# Patient Record
Sex: Female | Born: 1970 | Race: White | Hispanic: No | Smoking: Never smoker
Health system: Southern US, Community
[De-identification: ages and names within clinical notes are randomized; demographics above are authoritative.]

## PROBLEM LIST (undated history)

## (undated) DIAGNOSIS — K449 Diaphragmatic hernia without obstruction or gangrene: Secondary | ICD-10-CM

## (undated) DIAGNOSIS — K2270 Barrett's esophagus without dysplasia: Secondary | ICD-10-CM

## (undated) DIAGNOSIS — F419 Anxiety disorder, unspecified: Secondary | ICD-10-CM

## (undated) DIAGNOSIS — T7840XA Allergy, unspecified, initial encounter: Secondary | ICD-10-CM

## (undated) DIAGNOSIS — M797 Fibromyalgia: Secondary | ICD-10-CM

## (undated) DIAGNOSIS — G35 Multiple sclerosis: Secondary | ICD-10-CM

## (undated) DIAGNOSIS — R000 Tachycardia, unspecified: Secondary | ICD-10-CM

## (undated) DIAGNOSIS — Z9289 Personal history of other medical treatment: Secondary | ICD-10-CM

## (undated) DIAGNOSIS — F319 Bipolar disorder, unspecified: Secondary | ICD-10-CM

## (undated) DIAGNOSIS — M069 Rheumatoid arthritis, unspecified: Secondary | ICD-10-CM

## (undated) DIAGNOSIS — F32A Depression, unspecified: Secondary | ICD-10-CM

## (undated) DIAGNOSIS — IMO0002 Reserved for concepts with insufficient information to code with codable children: Secondary | ICD-10-CM

## (undated) DIAGNOSIS — K589 Irritable bowel syndrome without diarrhea: Secondary | ICD-10-CM

## (undated) DIAGNOSIS — K5120 Ulcerative (chronic) proctitis without complications: Secondary | ICD-10-CM

## (undated) DIAGNOSIS — N159 Renal tubulo-interstitial disease, unspecified: Secondary | ICD-10-CM

## (undated) DIAGNOSIS — D649 Anemia, unspecified: Secondary | ICD-10-CM

## (undated) DIAGNOSIS — M329 Systemic lupus erythematosus, unspecified: Secondary | ICD-10-CM

## (undated) DIAGNOSIS — D509 Iron deficiency anemia, unspecified: Secondary | ICD-10-CM

## (undated) DIAGNOSIS — K219 Gastro-esophageal reflux disease without esophagitis: Secondary | ICD-10-CM

## (undated) DIAGNOSIS — F329 Major depressive disorder, single episode, unspecified: Secondary | ICD-10-CM

## (undated) HISTORY — DX: Allergy, unspecified, initial encounter: T78.40XA

## (undated) HISTORY — PX: NASAL SINUS SURGERY: SHX719

## (undated) HISTORY — DX: Systemic lupus erythematosus, unspecified: M32.9

## (undated) HISTORY — DX: Barrett's esophagus without dysplasia: K22.70

## (undated) HISTORY — DX: Depression, unspecified: F32.A

## (undated) HISTORY — DX: Tachycardia, unspecified: R00.0

## (undated) HISTORY — DX: Ulcerative (chronic) proctitis without complications: K51.20

## (undated) HISTORY — DX: Diaphragmatic hernia without obstruction or gangrene: K44.9

## (undated) HISTORY — PX: APPENDECTOMY: SHX54

## (undated) HISTORY — DX: Rheumatoid arthritis, unspecified: M06.9

## (undated) HISTORY — DX: Irritable bowel syndrome, unspecified: K58.9

## (undated) HISTORY — PX: MANDIBLE FRACTURE SURGERY: SHX706

## (undated) HISTORY — DX: Multiple sclerosis: G35

## (undated) HISTORY — DX: Iron deficiency anemia, unspecified: D50.9

## (undated) HISTORY — DX: Bipolar disorder, unspecified: F31.9

## (undated) HISTORY — DX: Anemia, unspecified: D64.9

## (undated) HISTORY — DX: Major depressive disorder, single episode, unspecified: F32.9

## (undated) HISTORY — DX: Reserved for concepts with insufficient information to code with codable children: IMO0002

## (undated) HISTORY — DX: Anxiety disorder, unspecified: F41.9

## (undated) HISTORY — DX: Renal tubulo-interstitial disease, unspecified: N15.9

## (undated) HISTORY — PX: PARTIAL HYSTERECTOMY: SHX80

## (undated) HISTORY — DX: Fibromyalgia: M79.7

## (undated) HISTORY — DX: Gastro-esophageal reflux disease without esophagitis: K21.9

## (undated) HISTORY — DX: Personal history of other medical treatment: Z92.89

---

## 1997-11-28 ENCOUNTER — Other Ambulatory Visit: Admission: RE | Admit: 1997-11-28 | Discharge: 1997-11-28 | Payer: Self-pay | Admitting: Obstetrics & Gynecology

## 1998-12-16 ENCOUNTER — Other Ambulatory Visit: Admission: RE | Admit: 1998-12-16 | Discharge: 1998-12-16 | Payer: Self-pay | Admitting: Obstetrics & Gynecology

## 1999-01-07 ENCOUNTER — Encounter: Payer: Self-pay | Admitting: Obstetrics & Gynecology

## 1999-01-07 ENCOUNTER — Ambulatory Visit (HOSPITAL_COMMUNITY): Admission: RE | Admit: 1999-01-07 | Discharge: 1999-01-07 | Payer: Self-pay | Admitting: Obstetrics & Gynecology

## 1999-09-24 ENCOUNTER — Observation Stay (HOSPITAL_COMMUNITY): Admission: RE | Admit: 1999-09-24 | Discharge: 1999-09-25 | Payer: Self-pay | Admitting: Oral and Maxillofacial Surgery

## 2000-01-22 ENCOUNTER — Other Ambulatory Visit: Admission: RE | Admit: 2000-01-22 | Discharge: 2000-01-22 | Payer: Self-pay | Admitting: Obstetrics & Gynecology

## 2000-07-20 ENCOUNTER — Other Ambulatory Visit: Admission: RE | Admit: 2000-07-20 | Discharge: 2000-07-20 | Payer: Self-pay | Admitting: Obstetrics & Gynecology

## 2000-09-24 ENCOUNTER — Ambulatory Visit (HOSPITAL_COMMUNITY): Admission: RE | Admit: 2000-09-24 | Discharge: 2000-09-24 | Payer: Self-pay | Admitting: Oral and Maxillofacial Surgery

## 2000-09-24 ENCOUNTER — Encounter: Payer: Self-pay | Admitting: Oral and Maxillofacial Surgery

## 2001-02-07 ENCOUNTER — Other Ambulatory Visit: Admission: RE | Admit: 2001-02-07 | Discharge: 2001-02-07 | Payer: Self-pay | Admitting: Obstetrics & Gynecology

## 2001-07-28 ENCOUNTER — Other Ambulatory Visit: Admission: RE | Admit: 2001-07-28 | Discharge: 2001-07-28 | Payer: Self-pay | Admitting: Obstetrics & Gynecology

## 2002-01-25 ENCOUNTER — Other Ambulatory Visit: Admission: RE | Admit: 2002-01-25 | Discharge: 2002-01-25 | Payer: Self-pay | Admitting: Obstetrics & Gynecology

## 2002-03-02 ENCOUNTER — Encounter: Payer: Self-pay | Admitting: Family Medicine

## 2002-03-02 ENCOUNTER — Ambulatory Visit (HOSPITAL_COMMUNITY): Admission: RE | Admit: 2002-03-02 | Discharge: 2002-03-02 | Payer: Self-pay | Admitting: Family Medicine

## 2002-09-30 ENCOUNTER — Inpatient Hospital Stay (HOSPITAL_COMMUNITY): Admission: AD | Admit: 2002-09-30 | Discharge: 2002-09-30 | Payer: Self-pay | Admitting: Gynecology

## 2003-01-30 ENCOUNTER — Other Ambulatory Visit: Admission: RE | Admit: 2003-01-30 | Discharge: 2003-01-30 | Payer: Self-pay | Admitting: Obstetrics & Gynecology

## 2003-03-06 ENCOUNTER — Encounter (INDEPENDENT_AMBULATORY_CARE_PROVIDER_SITE_OTHER): Payer: Self-pay

## 2003-03-06 ENCOUNTER — Observation Stay (HOSPITAL_COMMUNITY): Admission: RE | Admit: 2003-03-06 | Discharge: 2003-03-07 | Payer: Self-pay | Admitting: Surgery

## 2003-03-16 ENCOUNTER — Encounter: Admission: RE | Admit: 2003-03-16 | Discharge: 2003-03-16 | Payer: Self-pay | Admitting: Surgery

## 2003-09-24 ENCOUNTER — Ambulatory Visit (HOSPITAL_COMMUNITY): Admission: RE | Admit: 2003-09-24 | Discharge: 2003-09-24 | Payer: Self-pay | Admitting: Obstetrics and Gynecology

## 2003-11-17 ENCOUNTER — Inpatient Hospital Stay (HOSPITAL_COMMUNITY): Admission: AD | Admit: 2003-11-17 | Discharge: 2003-11-17 | Payer: Self-pay | Admitting: Obstetrics & Gynecology

## 2003-11-26 ENCOUNTER — Encounter (INDEPENDENT_AMBULATORY_CARE_PROVIDER_SITE_OTHER): Payer: Self-pay | Admitting: *Deleted

## 2003-11-26 ENCOUNTER — Inpatient Hospital Stay (HOSPITAL_COMMUNITY): Admission: AD | Admit: 2003-11-26 | Discharge: 2003-11-30 | Payer: Self-pay | Admitting: Obstetrics & Gynecology

## 2004-02-29 ENCOUNTER — Other Ambulatory Visit: Admission: RE | Admit: 2004-02-29 | Discharge: 2004-02-29 | Payer: Self-pay | Admitting: Obstetrics & Gynecology

## 2004-07-18 ENCOUNTER — Ambulatory Visit: Payer: Self-pay | Admitting: Gastroenterology

## 2004-08-21 ENCOUNTER — Ambulatory Visit: Payer: Self-pay | Admitting: Gastroenterology

## 2004-10-23 ENCOUNTER — Ambulatory Visit (HOSPITAL_COMMUNITY): Admission: RE | Admit: 2004-10-23 | Discharge: 2004-10-23 | Payer: Self-pay | Admitting: Orthopedic Surgery

## 2004-10-23 ENCOUNTER — Ambulatory Visit (HOSPITAL_BASED_OUTPATIENT_CLINIC_OR_DEPARTMENT_OTHER): Admission: RE | Admit: 2004-10-23 | Discharge: 2004-10-23 | Payer: Self-pay | Admitting: Orthopedic Surgery

## 2004-10-24 ENCOUNTER — Ambulatory Visit: Payer: Self-pay | Admitting: Gastroenterology

## 2005-03-11 ENCOUNTER — Other Ambulatory Visit: Admission: RE | Admit: 2005-03-11 | Discharge: 2005-03-11 | Payer: Self-pay | Admitting: Obstetrics & Gynecology

## 2005-04-17 ENCOUNTER — Ambulatory Visit: Payer: Self-pay | Admitting: Gastroenterology

## 2005-05-15 ENCOUNTER — Ambulatory Visit: Payer: Self-pay | Admitting: Gastroenterology

## 2005-05-25 ENCOUNTER — Encounter (INDEPENDENT_AMBULATORY_CARE_PROVIDER_SITE_OTHER): Payer: Self-pay | Admitting: *Deleted

## 2005-05-25 ENCOUNTER — Ambulatory Visit: Payer: Self-pay | Admitting: Gastroenterology

## 2005-06-12 ENCOUNTER — Ambulatory Visit: Payer: Self-pay | Admitting: Gastroenterology

## 2005-07-13 ENCOUNTER — Ambulatory Visit: Payer: Self-pay | Admitting: Internal Medicine

## 2005-09-11 ENCOUNTER — Ambulatory Visit: Payer: Self-pay | Admitting: Gastroenterology

## 2005-12-07 ENCOUNTER — Emergency Department (HOSPITAL_COMMUNITY): Admission: EM | Admit: 2005-12-07 | Discharge: 2005-12-07 | Payer: Self-pay | Admitting: Emergency Medicine

## 2006-01-28 ENCOUNTER — Ambulatory Visit: Payer: Self-pay | Admitting: Gastroenterology

## 2006-02-23 ENCOUNTER — Ambulatory Visit: Payer: Self-pay | Admitting: Gastroenterology

## 2006-03-19 ENCOUNTER — Ambulatory Visit: Payer: Self-pay | Admitting: Gastroenterology

## 2006-05-06 ENCOUNTER — Ambulatory Visit: Payer: Self-pay | Admitting: Gastroenterology

## 2006-10-15 ENCOUNTER — Ambulatory Visit: Payer: Self-pay | Admitting: Gastroenterology

## 2006-11-19 ENCOUNTER — Ambulatory Visit: Payer: Self-pay | Admitting: Gastroenterology

## 2007-03-04 ENCOUNTER — Ambulatory Visit: Payer: Self-pay | Admitting: Gastroenterology

## 2007-03-04 LAB — CONVERTED CEMR LAB
Albumin: 4.5 g/dL (ref 3.5–5.2)
Alkaline Phosphatase: 37 units/L — ABNORMAL LOW (ref 39–117)
BUN: 12 mg/dL (ref 6–23)
Basophils Absolute: 0 10*3/uL (ref 0.0–0.1)
Basophils Relative: 0.6 % (ref 0.0–1.0)
CO2: 30 meq/L (ref 19–32)
Eosinophils Absolute: 0.2 10*3/uL (ref 0.0–0.6)
Eosinophils Relative: 2.6 % (ref 0.0–5.0)
Ferritin: 54.7 ng/mL (ref 10.0–291.0)
GFR calc Af Amer: 82 mL/min
GFR calc non Af Amer: 67 mL/min
Glucose, Bld: 103 mg/dL — ABNORMAL HIGH (ref 70–99)
HCT: 39.2 % (ref 36.0–46.0)
Monocytes Absolute: 0.3 10*3/uL (ref 0.2–0.7)
Monocytes Relative: 5.7 % (ref 3.0–11.0)
Neutro Abs: 4.1 10*3/uL (ref 1.4–7.7)
Neutrophils Relative %: 69.6 % (ref 43.0–77.0)
Saturation Ratios: 31.3 % (ref 20.0–50.0)
Sed Rate: 6 mm/hr (ref 0–25)
Sodium: 141 meq/L (ref 135–145)
TSH: 1.13 microintl units/mL (ref 0.35–5.50)
Total Bilirubin: 0.7 mg/dL (ref 0.3–1.2)
Total Protein: 7.4 g/dL (ref 6.0–8.3)
Transferrin: 200.6 mg/dL — ABNORMAL LOW (ref >=212.0)
WBC: 5.8 10*3/uL (ref 4.5–10.5)

## 2007-03-18 ENCOUNTER — Ambulatory Visit: Payer: Self-pay | Admitting: Gastroenterology

## 2007-03-18 ENCOUNTER — Encounter: Payer: Self-pay | Admitting: Gastroenterology

## 2007-04-15 ENCOUNTER — Ambulatory Visit: Payer: Self-pay | Admitting: Gastroenterology

## 2007-04-22 DIAGNOSIS — K219 Gastro-esophageal reflux disease without esophagitis: Secondary | ICD-10-CM | POA: Insufficient documentation

## 2007-04-22 DIAGNOSIS — F411 Generalized anxiety disorder: Secondary | ICD-10-CM | POA: Insufficient documentation

## 2007-04-22 DIAGNOSIS — F329 Major depressive disorder, single episode, unspecified: Secondary | ICD-10-CM

## 2007-04-22 DIAGNOSIS — R110 Nausea: Secondary | ICD-10-CM

## 2007-05-20 ENCOUNTER — Ambulatory Visit: Payer: Self-pay | Admitting: Gastroenterology

## 2007-10-18 ENCOUNTER — Telehealth: Payer: Self-pay | Admitting: Gastroenterology

## 2007-10-20 ENCOUNTER — Telehealth (INDEPENDENT_AMBULATORY_CARE_PROVIDER_SITE_OTHER): Payer: Self-pay | Admitting: *Deleted

## 2007-12-16 ENCOUNTER — Telehealth (INDEPENDENT_AMBULATORY_CARE_PROVIDER_SITE_OTHER): Payer: Self-pay | Admitting: *Deleted

## 2008-03-07 ENCOUNTER — Encounter
Admission: RE | Admit: 2008-03-07 | Discharge: 2008-03-08 | Payer: Self-pay | Admitting: Physical Medicine & Rehabilitation

## 2008-03-08 ENCOUNTER — Ambulatory Visit: Payer: Self-pay | Admitting: Physical Medicine & Rehabilitation

## 2008-04-06 ENCOUNTER — Ambulatory Visit: Payer: Self-pay | Admitting: Gastroenterology

## 2008-04-19 ENCOUNTER — Telehealth: Payer: Self-pay | Admitting: Gastroenterology

## 2008-05-02 ENCOUNTER — Telehealth: Payer: Self-pay | Admitting: Gastroenterology

## 2008-05-31 ENCOUNTER — Encounter: Payer: Self-pay | Admitting: Gastroenterology

## 2008-06-01 ENCOUNTER — Encounter: Payer: Self-pay | Admitting: Gastroenterology

## 2008-08-06 ENCOUNTER — Emergency Department (HOSPITAL_COMMUNITY): Admission: EM | Admit: 2008-08-06 | Discharge: 2008-08-07 | Payer: Self-pay | Admitting: Emergency Medicine

## 2008-12-04 ENCOUNTER — Inpatient Hospital Stay (HOSPITAL_COMMUNITY): Admission: RE | Admit: 2008-12-04 | Discharge: 2008-12-06 | Payer: Self-pay | Admitting: Psychiatry

## 2008-12-04 ENCOUNTER — Emergency Department (HOSPITAL_COMMUNITY): Admission: EM | Admit: 2008-12-04 | Discharge: 2008-12-04 | Payer: Self-pay | Admitting: Emergency Medicine

## 2008-12-04 ENCOUNTER — Ambulatory Visit: Payer: Self-pay | Admitting: Psychiatry

## 2009-03-17 ENCOUNTER — Emergency Department (HOSPITAL_COMMUNITY): Admission: EM | Admit: 2009-03-17 | Discharge: 2009-03-18 | Payer: Self-pay | Admitting: Emergency Medicine

## 2009-03-18 ENCOUNTER — Emergency Department (HOSPITAL_COMMUNITY): Admission: EM | Admit: 2009-03-18 | Discharge: 2009-03-18 | Payer: Self-pay | Admitting: Emergency Medicine

## 2009-05-16 ENCOUNTER — Telehealth (INDEPENDENT_AMBULATORY_CARE_PROVIDER_SITE_OTHER): Payer: Self-pay | Admitting: *Deleted

## 2009-09-06 ENCOUNTER — Ambulatory Visit: Payer: Self-pay | Admitting: Gastroenterology

## 2009-09-09 ENCOUNTER — Ambulatory Visit: Payer: Self-pay | Admitting: Gastroenterology

## 2009-09-09 LAB — CONVERTED CEMR LAB
AST: 26 units/L (ref 0–37)
Alkaline Phosphatase: 58 units/L (ref 39–117)
Basophils Absolute: 0 10*3/uL (ref 0.0–0.1)
Basophils Relative: 0 % (ref 0.0–3.0)
Bilirubin, Direct: 0 mg/dL (ref 0.0–0.3)
Calcium: 9.3 mg/dL (ref 8.4–10.5)
Creatinine, Ser: 0.8 mg/dL (ref 0.4–1.2)
Eosinophils Absolute: 0.1 10*3/uL (ref 0.0–0.7)
Glucose, Bld: 90 mg/dL (ref 70–99)
IgA: 375 mg/dL (ref 68–378)
Iron: 27 ug/dL — ABNORMAL LOW (ref 42–145)
Lymphocytes Relative: 8.7 % — ABNORMAL LOW (ref 12.0–46.0)
MCV: 83.9 fL (ref 78.0–100.0)
Neutro Abs: 16 10*3/uL — ABNORMAL HIGH (ref 1.4–7.7)
Platelets: 357 10*3/uL (ref 150.0–400.0)
Potassium: 4.1 meq/L (ref 3.5–5.1)
RBC: 4.02 M/uL (ref 3.87–5.11)
Sodium: 141 meq/L (ref 135–145)
Total Bilirubin: 0.4 mg/dL (ref 0.3–1.2)
Total Protein: 7.4 g/dL (ref 6.0–8.3)
Transferrin: 358.2 mg/dL (ref 212.0–360.0)
WBC: 18.3 10*3/uL (ref 4.5–10.5)

## 2009-09-13 ENCOUNTER — Telehealth: Payer: Self-pay | Admitting: Gastroenterology

## 2009-11-19 ENCOUNTER — Telehealth (INDEPENDENT_AMBULATORY_CARE_PROVIDER_SITE_OTHER): Payer: Self-pay | Admitting: *Deleted

## 2010-01-01 ENCOUNTER — Telehealth (INDEPENDENT_AMBULATORY_CARE_PROVIDER_SITE_OTHER): Payer: Self-pay | Admitting: *Deleted

## 2010-01-13 ENCOUNTER — Telehealth: Payer: Self-pay | Admitting: Gastroenterology

## 2010-04-08 ENCOUNTER — Emergency Department (HOSPITAL_COMMUNITY)
Admission: EM | Admit: 2010-04-08 | Discharge: 2010-04-08 | Payer: Self-pay | Source: Home / Self Care | Admitting: Emergency Medicine

## 2010-05-25 ENCOUNTER — Encounter: Payer: Self-pay | Admitting: Obstetrics and Gynecology

## 2010-05-25 ENCOUNTER — Encounter: Payer: Self-pay | Admitting: Gastroenterology

## 2010-06-05 NOTE — Progress Notes (Signed)
  Phone Note Other Incoming   Request: Send information Summary of Call: Request for records received from [facility]. Request forwarded to [facility]

## 2010-06-05 NOTE — Progress Notes (Signed)
  Phone Note Other Incoming   Request: Send information Summary of Call: Request for records received from [facility]. Request forwarded to [facility].

## 2010-06-05 NOTE — Progress Notes (Signed)
Summary: Zofran refill  Phone Note Call from Patient Call back at Home Phone [phone]   Call For: Dr Jarold Motto Reason for Call: Talk to Nurse Summary of Call: Was in last friday- forgot to ask for a [facility] [address]. Feels beyond sick and has no medicine right now. an it be done today or as soon as possible? Initial call taken by: Leanor [facility],  Sep 13, 2009 1:37 PM  Follow-up for Phone Call        [facility] to give refill? Follow-up by: Ashok Cordia RN,  Sep 13, 2009 2:02 PM  Additional Follow-up for Phone Call Additional follow up Details #1::        ok.... Additional Follow-up by: Mardella Layman MD [facility],  Sep 13, 2009 2:06 PM     Appended Document: Zofran refill Rx sent.  Pt notified.   Clinical Lists Changes  Medications: Added new medication of ZOFRAN 8 MG TABS (ONDANSETRON HCL) two times a day as needed - Signed Rx of ZOFRAN 8 MG TABS (ONDANSETRON HCL) two times a day as needed;  #60 x 3;  Signed;  Entered by: Ashok Cordia RN;  Authorized by: Mardella Layman MD [facility];  Method used: [facility] [address] [phone]*, [address], [place], [place], [place]  [address], Ph: [phone] or [phone], Fax: [phone]    Prescriptions: ZOFRAN 8 MG TABS (ONDANSETRON HCL) two times a day as needed  #60 x 3   Entered by:   Ashok Cordia RN   Authorized by:   Mardella Layman MD [facility]   Signed by:   Ashok Cordia RN on 09/13/2009   Method used:   Electronically to        [facility] [address] [phone]* (retail)       [address]       [place], [place]  [address]       Ph: [phone] or [phone]       Fax: [phone]   RxID:   [id number]

## 2010-06-05 NOTE — Assessment & Plan Note (Signed)
Summary: LATE ON HER YEARLY F-UP/YF   History of Present Illness Visit Type: Follow-up Visit Primary GI MD: Sheryn Bison MD FACP FAGA Primary Provider: Annamaria Helling, MD Chief Complaint: Patient complains of generalized abdominal pain and bloating that started about a week ago but denies any other GI comlaints. Patient states that she stopped all of her medications because she fleft better. History of Present Illness:   40 year old white female with chronic IBS and apparently severe fibromyalgia currently being treated by Dr. Octaviano Glow in [place]. Apparently She Is to [facility] for her fibromyalgia pains. She has chronic situational depression and is on Prozac 40 mg a day.  Her main GI complaint is abdominal gas and bloating without bowel irregularity, melena, significant hematochezia, upper GI or hepatobiliary complaints. Recti skin she gained 25 pounds in weight over the last year related to a stressful divorce. She also apparently has associated dermatitis from self-mutilation. She denies current psychotic symptomatology. She denies any specific food intolerances, or use of sorbitol or fructose.   GI Review of Systems    Reports abdominal pain and  bloating.     Location of  Abdominal pain: generalized.    Denies acid reflux, belching, chest pain, dysphagia with liquids, dysphagia with solids, heartburn, loss of appetite, nausea, vomiting, vomiting blood, weight loss, and  weight gain.        Denies anal fissure, black tarry stools, change in bowel habit, constipation, diarrhea, diverticulosis, fecal incontinence, heme positive stool, hemorrhoids, irritable bowel syndrome, jaundice, light color stool, liver problems, rectal bleeding, and  rectal pain.    Current Medications (verified): 1)  Xyzal 5 Mg Tabs (Levocetirizine Dihydrochloride) .... Take 1 Tablet By Mouth Once A Day 2)  Flonase 50 Mcg/act Susp (Fluticasone Propionate) .... 2 Sprays Each Nostril Every Night 3)  Astelin  137 Mcg/spray Soln (Azelastine Hcl) .... Use Two Sprays Once A Day 4)  Prozac 40 Mg Caps (Fluoxetine Hcl) .... Take One By Mouth Once Daily  Allergies (verified): No Known Drug Allergies  Past History:  Past medical, surgical, family and social histories (including risk factors) reviewed for relevance to current acute and chronic problems.  Past Medical History: Current Problems:  ? of IRRITABLE BOWEL SYNDROME (ICD-564.1) ? 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 sinus surgery orthonatic surgery-jaw appendectomy c-section partial hysterectomy  Family History: Reviewed history from 04/06/2008 and no changes required. No FH of Colon Cancer: Family History of Diabetes: Paternal Grandmother, Paternal Grandfather Family History of Clotting disorder: Father  Social History: Reviewed history from 04/06/2008 and no changes required. Patient has never smoked.  Alcohol Use - yes- rare Illicit Drug Use - no Patient gets regular exercise.  Review of Systems       The patient complains of allergy/sinus, anxiety-new, arthritis/joint pain, back pain, fatigue, and sleeping problems.  The patient denies anemia, blood in urine, breast changes/lumps, change in vision, confusion, cough, coughing up blood, depression-new, fainting, fever, headaches-new, hearing problems, heart murmur, heart rhythm changes, itching, menstrual pain, muscle pains/cramps, night sweats, nosebleeds, pregnancy symptoms, shortness of breath, skin rash, sore throat, swelling of feet/legs, swollen lymph glands, thirst - excessive , urination - excessive , urination changes/pain, urine leakage, vision changes, and voice change.    Vital Signs:  Patient profile:   40  year old female Height:      64  inches Weight:      152.2 pounds BMI:     24.65 Pulse rate:   80 / minute Pulse rhythm:   regular BP sitting:   116 / 62  (right arm) Cuff size:   regular  Vitals Entered By: Harlow Mares CMA Duncan Dull) (Sep 06, 2009 12:03 PM)  Physical Exam  General:  Well developed, well nourished, no acute distress.healthy appearing.  Chronic depigmentation and scarring of her facial area noted. I cannot appreciate stigmata of chronic liver disease. Head:  Normocephalic and atraumatic. Eyes:  PERRLA, no icterus.exam deferred to patient's ophthalmologist.   Lungs:  Clear throughout to auscultation. Heart:  Regular rate and rhythm; no murmurs, rubs,  or bruits. Abdomen:  Soft, nontender and nondistended. No masses, hepatosplenomegaly or hernias noted. Normal bowel sounds.no That significant distention, masses or tenderness. Bowel sounds are entirely normal. Rectal:  deferred. Pulses:  Normal pulses noted. Extremities:  No clubbing, cyanosis, edema or deformities noted. Neurologic:  Alert and  oriented x4;  grossly normal neurologically. Psych:  Alert and cooperative. Normal mood and affect.   Impression & Recommendations:  Problem # 1:  ? of IRRITABLE BOWEL SYNDROME (ICD-564.1) Assessment Improved Trial of Librax one p.o. t.i.d.Align probiotic therapy, and check screening labs including a celiac profile. Orders: TLB-B12, Serum-Total ONLY (25366-Y40) TLB-Ferritin (82728-FER) TLB-Folic Acid (Folate) (82746-FOL) TLB-IBC Pnl (Iron/FE;Transferrin) (83550-IBC) TLB-Sedimentation Rate (ESR) (85652-ESR) TLB-IgA (Immunoglobulin A) (82784-IGA) T-Sprue Panel (Celiac Disease Aby Eval) (83516x3/[address])  Problem # 2:  DEPRESSION (ICD-311) Assessment: Improved Continue Prozac 40 mg a day. I've advised her to follow advice of her new physician who is treating her fibromyalgia. I have no records for review today. Repeat labs have been ordered as screening procedures and to  exclude hypothyroidism.  Other Orders: TLB-CBC Platelet - w/Differential (85025-CBCD) TLB-BMP (Basic Metabolic Panel-BMET) (80048-METABOL) TLB-Hepatic/Liver Function Pnl (80076-HEPATIC) TLB-TSH (Thyroid Stimulating Hormone) (34742-VZD)  Patient Instructions: 1)  Please go to the basement for lab work. 2)  Begin Librax three times a day as needed. 3)  The medication list was reviewed and reconciled.  All changed / newly prescribed medications were explained.  A complete medication list was provided to the patient / caregiver. 4)  Align daily aspirin body trial 5)  Copy sent to : Dr. Annamaria Helling 6)  Please continue current medications.  7)  IBS brochure given.   Appended Document: LATE ON HER YEARLY F-UP/YF    Clinical Lists Changes  Medications: Added new medication of CLIDINIUM-CHLORDIAZEPOXIDE 2.5-5 MG CAPS (CLIDINIUM-CHLORDIAZEPOXIDE) 1 by mouth three times a day ac as needed - Signed Rx of CLIDINIUM-CHLORDIAZEPOXIDE 2.5-5 MG CAPS (CLIDINIUM-CHLORDIAZEPOXIDE) 1 by mouth three times a day ac as needed;  #90 x 3;  Signed;  Entered by: Ashok Cordia RN;  Authorized by: Mardella Layman MD [facility];  Method used: [facility] [address] [phone]*, [address] [place], [place], [place]  [address], Ph: [phone] or [phone], Fax: [phone]    Prescriptions: CLIDINIUM-CHLORDIAZEPOXIDE 2.5-5 MG CAPS (CLIDINIUM-CHLORDIAZEPOXIDE) 1 by mouth three times a day ac as needed  #90 x 3   Entered by:   Ashok Cordia RN   Authorized by:   Mardella Layman MD [facility]   Signed by:   Ashok Cordia RN on 09/06/2009   Method used:   Electronically to        [facility] [address] [phone]* (retail)       [address]       [place]  [place], [place]  [address]       Ph: [phone] or [phone]       Fax: [phone]   RxID:   [id number]

## 2010-06-05 NOTE — Progress Notes (Signed)
Summary: Record request from [employer]  Record request from [employer]. Request forwarded to [facility]. Wilder Glade  May 16, 2009 4:24 PM

## 2010-06-05 NOTE — Progress Notes (Signed)
Summary: Disability Paper work  Phone Note [employer] back at [employer] [phone]   Call placed by: Harlow Mares CMA Duncan Dull),  January 13, 2010 2:07 PM Call placed to: Patient Summary of Call: Left a message on patients machine to call back, about the disability paper work.  Initial call taken by: Harlow Mares CMA Duncan Dull),  January 13, 2010 2:07 PM  Follow-up for Phone Call        Left a message on patients machine to call back.  Follow-up by: Harlow Mares CMA Duncan Dull),  January 14, 2010 1:53 PM  Additional Follow-up for Phone Call Additional follow up Details #1::        Left a message on patients machine to call back, if she would like Dr. Jarold Motto input on the paperwork she will need an office visit. I have tried to call her several times but no return call back. She should get her primary care MD to fill out the paper work or call me back to discuss how Dr. Jarold Motto can help her with it. I have sent the disability papers back down to [facility], with a copy of this note.  Additional Follow-up by: Harlow Mares CMA (AAMA),  January 15, 2010 12:30 PM

## 2010-07-14 LAB — POCT PREGNANCY, URINE: Preg Test, Ur: NEGATIVE

## 2010-07-14 LAB — POCT I-STAT, CHEM 8
BUN: 7 mg/dL (ref 6–23)
Calcium, Ion: 1.11 mmol/L — ABNORMAL LOW (ref 1.12–1.32)
Chloride: 107 mEq/L (ref 96–112)
Potassium: 3.6 mEq/L (ref 3.5–5.1)

## 2010-07-14 LAB — URINALYSIS, ROUTINE W REFLEX MICROSCOPIC
Bilirubin Urine: NEGATIVE
Ketones, ur: 15 mg/dL — AB
Specific Gravity, Urine: 1.017 (ref 1.005–1.030)
Urobilinogen, UA: 0.2 mg/dL (ref 0.0–1.0)
pH: 6 (ref 5.0–8.0)

## 2010-07-14 LAB — POCT CARDIAC MARKERS
CKMB, poc: <1 ng/mL — ABNORMAL LOW (ref 1.0–8.0)
Troponin i, poc: <0.05 ng/mL (ref 0.00–0.09)

## 2010-07-14 LAB — D-DIMER, QUANTITATIVE: D-Dimer, Quant: 0.27 ug/mL-FEU (ref 0.00–0.48)

## 2010-08-06 LAB — URINE MICROSCOPIC-ADD ON

## 2010-08-06 LAB — POCT I-STAT, CHEM 8
BUN: 16 mg/dL (ref 6–23)
Calcium, Ion: 1.2 mmol/L (ref 1.12–1.32)
Chloride: 104 mEq/L (ref 96–112)
Sodium: 141 mEq/L (ref 135–145)

## 2010-08-06 LAB — GLUCOSE, CAPILLARY

## 2010-08-06 LAB — RAPID URINE DRUG SCREEN, HOSP PERFORMED
Amphetamines: NOT DETECTED
Cocaine: NOT DETECTED
Opiates: NOT DETECTED

## 2010-08-06 LAB — URINALYSIS, ROUTINE W REFLEX MICROSCOPIC: Nitrite: NEGATIVE

## 2010-08-09 LAB — POCT I-STAT, CHEM 8
BUN: 15 mg/dL (ref 6–23)
Creatinine, Ser: 0.9 mg/dL (ref 0.4–1.2)
Sodium: 140 mEq/L (ref 135–145)
TCO2: 24 mmol/L (ref 0–100)

## 2010-08-09 LAB — CBC
HCT: 37.6 % (ref 36.0–46.0)
Hemoglobin: 12.5 g/dL (ref 12.0–15.0)
WBC: 6.1 10*3/uL (ref 4.0–10.5)

## 2010-08-09 LAB — URINE MICROSCOPIC-ADD ON

## 2010-08-09 LAB — DIFFERENTIAL
Eosinophils Relative: 2 % (ref 0–5)
Lymphocytes Relative: 17 % (ref 12–46)
Lymphs Abs: 1.1 10*3/uL (ref 0.7–4.0)
Monocytes Absolute: 0.4 10*3/uL (ref 0.1–1.0)

## 2010-08-09 LAB — URINALYSIS, ROUTINE W REFLEX MICROSCOPIC
Glucose, UA: NEGATIVE mg/dL
pH: 6 (ref 5.0–8.0)

## 2010-08-09 LAB — ETHANOL: Alcohol, Ethyl (B): <5 mg/dL (ref 0–10)

## 2010-09-16 NOTE — Assessment & Plan Note (Signed)
[facility]                         GASTROENTEROLOGY OFFICE NOTE   KEELIE, ZEMANEK                        MRN:          [id number]  DATE:05/20/2007                            DOB:          12-10-1970    Khiara is really doing fairly well but has some continued nausea and  also stress related diarrhea.  I have renewed her Klonopin 8.5 mg every  12 hours and given her some Phenergan 25 mg tablets to break in half and  use every 6 hours as needed.  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  If she does well I will see her back in 2  month's time for followup.     [name]. Jarold Motto, MD, Caleen Essex, FAGA  Electronically Signed    DRP/MedQ  DD: 05/20/2007  DT: 05/20/2007  Job #: [id number]

## 2010-09-16 NOTE — Assessment & Plan Note (Signed)
[facility]                         GASTROENTEROLOGY OFFICE NOTE   BLESSED, GIRDNER                        MRN:          [id number]  DATE:11/19/2006                            DOB:          05/01/1971    Brandi Santiago is doing much better on Aciphex.  She really denies GI complaints  except for gas and bloating.  She seems to definitely have some lactose  intolerance.  We discussed using p.r.n. Lactate tablets and renewed her  Aciphex.  She also uses p.r.n. Zofran. Because of her rather marked  improvement I do not think we need to repeat her endoscopy exam at this  time.  I will continue to follow her as needed and will continue other  medications listed and reviewed in her chart.     Brandi Rea. Jarold Motto, MD, Caleen Essex, FAGA  Electronically Signed    DRP/MedQ  DD: 11/19/2006  DT: 11/19/2006  Job #: [id number]

## 2010-09-16 NOTE — Assessment & Plan Note (Signed)
[name]                         GASTROENTEROLOGY OFFICE NOTE   Brandi Santiago, Brandi Santiago                        MRN:          [id number]  DATE:10/15/2006                            DOB:          Nov 09, 1970    [name] is doing well from a personal standpoint and psychological  standpoint.  She has early morning nausea with some associated diarrhea  to the point that she uses up to 8 mg of  Zofran a day.  She denies  dyspepsia or other GI complaints except for alternating diarrhea and  constipation.  She currently is on Reglan 5 mg before meals and at  bedtime, along with Carafate 4 times a day.   Despite all of these complaints, she has had no real systemic  complaints, anorexia or weight loss.  She denies NSAID abuse.  In review  of her chart she does have a history of a gastric ulcer several years  ago, but H. pylori biopsy at that time was negative.  Last endoscopic  exam was January 2007.  It was fairly unremarkable except for chronic  acid reflux changes and biopsies did not show Barrett's mucosa.  I  cannot see where she has had small bowel biopsy.  She actually in the  past has had a lactose breath  test and it was negative.   We obtained an ultrasound of her abdomen performed last spring which was  unremarkable.  She has had a sprue examination in the past but I believe  it was negative, although I cannot find this result.   She is a healthy, attractive-appearing white female in no distress.  Appears younger than her stated age.  She weighs 125 pounds and blood pressure is 88/50.  Pulse was 84 and  regular.  I could not appreciate hepatosplenomegaly, abdominal masses or  tenderness.  There was no distention and bowel sounds were normal.   ASSESSMENT:  1. Persistent nausea in a patient who is status post hysterectomy for      endometriosis.  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  2. Chronic gastrointestinal motility disorder with previous response      to metronidazole therapy.   RECOMMENDATIONS:  1. Trial of Aciphex 20 mg a day along with other meds.  2. Renew Zofran.  She requests 8 mg./day use.  3. Consider endoscopy.  Repeat small bowel biopsies.  4. We will see her back in 1 month's time.  Since I cannot find her      sprue antibodies we will need to repeat these on return to clinic      visit.     [name]. Jarold Motto, MD, Caleen Essex, FAGA  Electronically Signed   DRP/MedQ  DD: 10/15/2006  DT: 10/15/2006  Job #: [id number]

## 2010-09-16 NOTE — Assessment & Plan Note (Signed)
[facility]                         GASTROENTEROLOGY OFFICE NOTE   BABS, DABBS                        MRN:          [id number]  DATE:04/15/2007                            DOB:          [birth year]    Olivea did not fill her Canasa suppositories as requested because of  expense problems. She had rather marked proctitis on 03/29/2007  colonoscopy. She did start Lialda 2.4 grams per day and has had marked  improvement in her diarrhea and rectal bleeding. She continues to have  rather severe anxiety problems with associated anorexia, weight loss,  and sleep disturbance.   She is on Wellbutrin 300 mg daily. Some of her symptoms may be  exacerbated by her taking Reglan 5 mg before meals and at bedtime, but  she has had marked improvement in her nausea and early satiety on this  medication. She also takes Aciphex 20 mg daily, p.r.n. Carafate, and a  variety of multivitamins.   Rectal biopsies showed changes consistent either with NSAID damage,  solitary rectal ulcer syndrome, or Crohn's proctitis. The patient denies  abuse of NSAIDs, but this is questionable since previous gastric  biopsies have also suggested NSAID damage. I have checked inflammatory  bowel disease serologic markers because of cost to expenses.   She weighs 117 pounds which is down from her normal weight of  approximately 130. Blood pressure is 110/60, and pulse was 90 and  regular. General physical exam was not repeated.   LABORATORY DATA:  All of her lab tests were normal including TSH level,  B12, folate, iron levels, and liver function tests.   ASSESSMENT:  1. 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  2. Chronic GERD and chronic nausea. She is doing well on PPI and      Reglan therapy.  3. Chronic anxiety and depression.   RECOMMENDATIONS:  1. Continue all medications listed above.  2. Trial of Klonopin 1 mg every 12 hours on a p.r.n. basis as needed.  3. Continue Lialda 2.4 grams daily.  4. P.r.n. Anusol AC suppositories.  5. GI follow up in 2 to 3 months time.     [name]. Jarold Motto, MD, Caleen Essex, FAGA  Electronically Signed    DRP/MedQ  DD: 04/15/2007  DT: 04/16/2007  Job #: [id number]

## 2010-09-16 NOTE — Assessment & Plan Note (Signed)
[facility]                         GASTROENTEROLOGY OFFICE NOTE   Brandi Santiago, Brandi Santiago                        MRN:          [id number]  DATE:03/04/2007                            DOB:          1970/09/12    Rosebud had the sudden onset over the last two weeks of rectal bleeding  sometimes passing clots. She has had 1 to 2 bowel movements a day with  minimal abdominal pain and no systemic complaints. She denies being on  estrogen compounds or birth control pills, she is not using any Imitrex,  decongestants, or other vasospastic medications. Her last colonoscopic  exam was in January 2007, at which time she also had endoscopy done. She  has been diagnosed as having irritable bowel syndrome and I have never  really found any evidence of inflammatory bowel disease. She denies  recent antibiotic use except over the last 24 hours when she has been  placed on a broad spectrum antibiotic because of sinusitis. Last  ultrasound of the abdomen was in September 2007 and was unremarkable.  She had abdominal pelvic CT scan in March 2007 that showed ovarian cyst,  but some benign rectal peritoneal lymphadenopathy of questionable  etiology.   She is currently taking:  1. Multivitamins.  2. Wellbutrin 300 mg daily.  3. Reglan 5 mg q.i.d.  4. Carafate 1 gram q.i.d.  5. Align probiotic therapy.  6. Aciphex 20 mg daily.   She is a healthy appearing attractive white female in no distress. She  appears her stated age. She weighs 122 pounds. Her blood pressure is  100/70, pulse was 66 and regular. She is in a regular rhythm with no  murmurs, rubs, or gallops and her chest is clear. Her abdominal exam  shows no distension, organomegaly, masses, or tenderness. Inspection of  the rectum is unremarkable, as is rectal exam. There is soft normal  colored stool in the rectal vault that is markedly guaiac positive.   ASSESSMENT:  It certainly seems that Alianny has some has  type of  inflammatory colitis at this juncture accounting for her rectal  bleeding. It really does not sound like an infectious process, would be  atypical for ischemic bowel presentation, but she certainly could have  underlying ulcerative colitis which has flared.   RECOMMENDATIONS:  1. Multiple screening laboratory parameters.  2. Colonoscopic exam as soon as possible.  3. Start Lialda 2.4 grams daily and continue antispasmodics as      tolerated.    [name]. Jarold Motto, MD, Caleen Essex, FAGA  Electronically Signed   DRP/MedQ  DD: 03/04/2007  DT: 03/05/2007  Job #: [id number]

## 2010-09-16 NOTE — Consult Note (Signed)
CONSULT REQUESTED BY:  Pollyann Savoy, MD   HISTORY:  Ms. Fossett is a 40 year old female, who has had back pain for a  couple of years.  She has been on narcotic analgesics for this over the  last couple of years.  I do have records dating back to Sep 30, 2006,  per which she was getting fairly small doses per month, 20-40 tablets.  She went for about another 6 months without any and received some  hydrocodone from Dentistry for procedure and really not for her back,  and then she was not on any hydrocodone until December 12, 2007, when she  had an exacerbation of her back pain once again after going [place] the  large water slide.   She has had no significant lower extremity symptoms.  She has had  rheumatologic evaluation with Dr. Corliss Skains.  She had been diagnosed by  a chiropractor for fibromyalgia.  Prior to that time, her treatments  with the chiropractor included manipulation, E-Stim, and supplements.  At one point, she felt like chiropractic treatments were helping her  back, but at this point does not feel like it does.   She does see a psychiatrist for depression.  She has a history of  interstitial cystitis and irritable bowel syndrome.  She sees Dr.  Jarold Motto from GI.   She has not had the blood work that Dr. Corliss Skains recommended.   She has not had any formal physical therapy.  She does work out at [employer] using a yoga ball and doing some stretching.   Her pain is averaging 8/10, described as burning, dull, constant,  tingling, aching, interferes with activity at 7/10 level.  The pain is  worse during the morning.  Her sleep is poor.  Her pain is worse with  walking, sitting, and standing.  Improves with therapy and exercise.  She can walk fairly unlimited distances.  She does not have any energy  to do much in terms of household duties or meal prep.  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   REVIEW OF SYSTEMS:  Also, positive for depression,  anxiety, numbness,  tremors, and tingling.  Negative for suicidal thoughts.  Positive for  nausea and diarrhea, poor appetite, and coughing.   PAST SURGICAL HISTORY:  Appendectomy, sinus surgery, C-section, and some  oral surgery.   SOCIAL HISTORY:  Married, lives with her husband and children.   FAMILY HISTORY:  Diabetes.   Other medications that have helped in the past include Skelaxin,  hydrocodone has helped.  She has not had any for a while.   Her Oswestry Disability Score is 56%.   PHYSICAL EXAMINATION:  Neck range of motion is full.  She has tenderness  to palpation in bilateral upper trapezius, bilateral sternocostal,  bilateral suboccipital, bilateral anterior, lower sternocleidomastoid,  bilateral upper medial scapular border as well as bilateral low back  area.  These correspond to fibromyalgia tender points.   Her upper extremity and lower extremity strength are normal.  Her upper  extremity and lower extremity range of motion are normal.  Her reverse  Phalen's negative.  She has tenderness over the left PSIS to palpation.  She has negative pain with FABER maneuver.  She has pain with end-range  extension of the spine greater than with flexion.  Gait is normal.  Sensation is normal in the upper and lower extremities.  Normal  coordination in the upper and lower extremities.  Extremities without  edema.   IMPRESSION:  Lumbar pain:  This has been intermittent in the past, but  has been more consistent over the last couple of months.  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  We will ask for some sacroiliac mobilization and then some core  stabilization through physical therapy.  I will see the patient back in  about a month's time.  We will see how she is doing at that point, if no  better, we will check MRI of the lumbar spine, consider for sacroiliac  injections although she is quite apprehensive about any type of   injection.   In terms of her medications, we will continue her Skelaxin.  She may  benefit from hydrocodone as she has in the past.  Certainly, I do not  think she will need to be on this long-term, but at least until she gets  over the current exacerbation.  We will check a urine drug screen first  and as explained, the patient may take a week or so to get back.   I will see her back in followup.  Thank you for this interesting  consultation.  Certainly, her fibromyalgia does have quite a bit to do  with her overall pain symptomatology.  She is only on 300 mg at night,  we will increase to b.i.d. for a week, and then increase to t.i.d.  If  this is not successful in controlling fibromyalgia pain, I may have to  go up further, would trial Lyrica.      Erick Colace, M.D.  Electronically Signed     AEK/MedQ  D:03/08/2008 16:03:06  T:03/09/2008 04:20:44  Job #:  [id number]   cc:   Pollyann Savoy, M.D.  Fax: [phone]   Lubertha Basque. Jerl Santos, M.D.  Fax: [phone]   Elana Alm. Nicholos Johns, M.D.  Fax: [phone]

## 2010-09-19 NOTE — Op Note (Signed)
[facility]  Patient:    Brandi Santiago, Brandi Santiago                       MRN: [id number] Proc. Date: 09/24/99 Adm. Date:  [id number] Disc. Date: [id number] Attending:  Beatriz Chancellor Dictator:   Virl Diamond, D.M.D., M.D. CC:         [facility], [address], [place],             [place], [place]                           Operative Report  PREOPERATIVE DIAGNOSES: 1. Retrogenia. 2. Somatolipomatosis.  POSTOPERATIVE DIAGNOSES: 1. Retrogenia. 2. Somatolipomatosis.  PROCEDURES: 1. Placement of alloplastic chin implant via the intraoral route. 2. Machine-assisted submental liposuction.  ATTENDING:  Lyndal Pulley. Chales Salmon D.D.S., M.D.  ASSISTANT:  Virl Diamond, D.M.D., M.D.  ANESTHESIA:  General endotracheal via nasoendotracheal intubation.  INDICATIONS FOR SURGERY:  Ms. Brandi Santiago is a 40 year old female who presents with somatolipomatosis and severe retrogenia requiring placement of a Mettelman pre jowl chin implant, size medium, and the need for somatoliposuction to improve the cervicomental form and platform.  DESCRIPTION OF PROCEDURE:  After completion of the orthognathic procedure, attention was then turned to the anterior mandibular region, where using a periosteal elevator, the anterior mandibular inferior border was identified, midlines were marked, and dissection using a #9 periosteal elevator through the inferior border plane was performed to allow for placement of an alloplastic Mettelman pre jowl chin extension implant.  At this time, the implant was then placed and positioned to be correctly in the midline.  Using the Leibinger 2.0 fixation system, two 10 mm screws were then placed for rigid fixation of the alloplastic chin implant.  The skin-soft tissue envelope was then allowed to be re-draped to allow for symmetry observation as well as the pre jowl extensions.  It was noted that good symmetry had been achieved and that the implant was in  the midline.  Additionally, the pre jowl extensions were noted to be in the correct position, just inferior border of the mandible. Copious irrigation was then performed of this area, and then closure was achieved in two-layer fashion using 3-0 Vicryl to close the mentalis muscle and 4-0 chromic gut to close the mucosal incision in a running baseball stitch fashion.  Attention was then turned to the submental region, where using a surgical marker, the midlines were then drawn and a small incision line was then drawn in the submental crease.  Using a #11 blade, a small stab incision was then performed.  Using the McGhan fill kit, the use of the _____ tumescent fluid was then used for tumescence of the submental region.  Time was allotted for hemostasis and anesthesia.  Using a 3 mm Teflon-coated spatula, dissection in the subcutaneous plane was then performed in the submental region.  This plane in the supraplatysmal dissection was then performed and achieved.  Using the Cobra 3 mm tip, submental liposuction, machine-assisted, was performed. Approximately 15 cc of subcutaneous fat was then removed.  The pinch test was used to achieve symmetry.  The stab incision was then closed using 6-0 fast-resorbing gut.  At this time, a half-inch foam contoured to the submental region was then placed over the incision site that had Polysporin and Telfa, and a four-inch Ace bandage was then used for a pressure dressing.  The patient tolerated the  procedure well without any complications.  The patient was then turned over to anesthesia, where full emergence from general anesthesia was performed without any complications.  The patient was then transported to the PACU and to the floor in a stable condition.  EBL for this portion of the procedure was approximately less than 100 cc.  There were no complications.  No drains.  Dressing as mentioned above.  No cultures. Complete and total EBL throughout the  entire procedures was approximately 700 cc.  Crystalloid replacement was 4500 cc.  Preoperative medication included 1 g of Ancef, 10 mg of Decadron, and 0.2 mg of clonidine. DD:  09/24/99 TD:  09/29/99 Job: [id number]

## 2010-09-19 NOTE — Op Note (Signed)
NAME:  Brandi Santiago, Brandi Santiago                           ACCOUNT NO.:  [id number]   MEDICAL RECORD NO.:  [id number]                   PATIENT TYPE:  INP   LOCATION:  9127                                 FACILITY:  [facility]   PHYSICIAN:  Ilda Mori, M.D.                DATE OF BIRTH:  [birth year]   DATE OF PROCEDURE:  11/26/2003  DATE OF DISCHARGE:                                 OPERATIVE REPORT   PREOPERATIVE DIAGNOSES:  1. Preterm labor.  2. Premature rupture of membranes.  3. Breech presentation.  4. Active labor.  5. Twin gestation.   POSTOPERATIVE DIAGNOSES:  1. Preterm labor.  2. Premature rupture of membranes.  3. Breech presentation.  4. Active labor.  5. Twin gestation.   PROCEDURE:  Primary low transverse cesarean section.   SURGEON:  Ilda Mori, M.D.   ASSISTANT:  Ginger Carne, M.D.   ANESTHESIA:  Spinal.   ESTIMATED BLOOD LOSS:  1500 mL.   FINDINGS:  Both babies were in the breech presentation.  Twin A was female,  weighed 1051 g (2 pounds 5 ounces), Apgar scores 5 and 8.  Twin B was  female, weighted 1016 g (2 pounds 4 ounces), Apgar scores 5 and 8.  Clear  amniotic fluid.   INDICATIONS:  This is a 40 year old primigravida who presented to the  hospital with preterm labor at 27-28 weeks' gestation.  The patient's  prenatal course was complicated by a triplet gestation following IVF.  The  patient had an elective fetal reduction to a twin pregnancy.  Her pregnancy  proceeded with uncomplicated except for episodes of bleeding for which no  diagnosis was ever found.  The patient presented to the hospital with  uterine contractions, was treated with terbutaline.  Cervix was unchanged.  She was followed for approximately a week or so with this problem until the  morning of the present admission, when she came in with significant  contractions and cervical change noted by Dr. Aldona Bar.  She was admitted and  placed on magnesium sulfate.  She continued to contract  despite 3 g of  magnesium sulfate tocolysis and at 11:30 on November 26, 2003, in the morning,  she ruptured her membranes.  Her contractions continued but did not seem to  worsen, and she was followed with the hopes of achieving 24 hours of  glucocorticoid therapy.  However, at approximately 1630 she began to feel  her contractions intensity, and examination revealed the complete breech  presentation at 0 to +1 station and a cervix that was totally effaced and 8-  9 cm dilated.  She was brought to the operating room and a primary low  transverse cesarean section was performed under spinal anesthesia.   PROCEDURE:  After spinal anesthesia was placed, the abdomen was prepped and  draped in a sterile fashion.  The bladder had previously been catheterized.  A low transverse incision was made  and carried down to the fascia, which was  extended transversely.  The rectus muscle was then divided in the midline  and the peritoneum was entered and extended bluntly.  The lower segment was  identified and incision was made.  The bladder was displaced inferiorly.  The incision was then carried down into the uterine cavity and extended  bluntly.  The twin A presentation was deep in the pelvis.  The breech was  pulled upwards and the feet were then delivered through the incision and the  breech delivery was accomplished without further delivery.  Twin B was then  delivered by breech extraction as well.  Cord bloods were obtained.  The  placenta was then delivered and sent to pathology.  The uterine segment was  then bluntly curettaged.  The segment was closed with running interlocking 0  Vicryl suture.  Bleeding was noted at the right corner of the incision, and  this was controlled with figure-of-eight sutures.  Because of bleeding from  the serosa, the bladder plane was closed with a running 3-0 Vicryl suture,  which was locked in certain areas to create better hemostasis.  The  peritoneum and rectus  muscle was then closed in the midline with a running 3-  0 Vicryl suture.  The fascia was closed with running 0Vicryl suture and the  skin was closed with staples.  The patient tolerated the procedure well and  left the OR in good condition.                                               Ilda Mori, M.D.    RK/MEDQ  D:  11/26/2003  T:  11/27/2003  Job:  [id number]

## 2010-09-19 NOTE — Discharge Summary (Signed)
Brandi Santiago, Brandi Santiago NO.:  [id number]   MEDICAL RECORD NO.:  [id number]          PATIENT TYPE:  IPS   LOCATION:  0301                          FACILITY:  [facility]   PHYSICIAN:  Geoffery Lyons, M.D.      DATE OF BIRTH:  December 31, 1970   DATE OF ADMISSION:  12/04/2008  DATE OF DISCHARGE:  12/06/2008                               DISCHARGE SUMMARY   CHIEF COMPLAINT:  This was the first admission to [facility] for this 40 year old female.  She admitted to feeling depressed.  The depression getting worse, unable to stay away from the children,  admitted to medical issues losing custody of the children.  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  Endorsed having passive thoughts of  suicide, feeling of hopelessness and helplessness.  Per father, she has  been manic, lying about where she has been and acting high.  Denies  any substance abuse, feeling that medication was not working and not  been compliant with them.   PAST PSYCHIATRIC HISTORY:  [facility] time at [facility].  Had been on  Cymbalta, Lexapro, Wellbutrin.  Also sees a therapist.   ALCOHOL AND DRUG HABITS:  Denies active use of any substances.   MEDICAL HISTORY:  1. Irritable bowel syndrome.  2. Fibromyalgia.  3. Stage I MS.  Exam failed to show any acute findings.   LABORATORY WORKUP:  Not available in the chart.   MEDICATIONS:  1. Prozac 40 mg per day.  2. Valium 10 mg 1-2 as needed for anxiety.  3. Trazodone 100 mg one to two at night.  4. Ambien CR 2.5 at bedtime.  5. Xyzal 5 mg per day.  6. Flonase 2 puffs daily.  7. Soma 250 mg 4 times a day.  8. Hydrocodone 10/650 every 4-6 hours as needed.  9. Neurontin 300 mg 4 times a day.  10.Adderall 30 mg 3 times a day.   PHYSICAL EXAMINATION:  Exam failed to show any acute findings.  The exam  reveals alert cooperative female.  Mood anxiety.  Affect anxiety.  Thought  processes logical, coherent and relevant.  Endorsed feeling  overwhelmed, instead not getting any better.  Cannot deal with being  separated from her children, but denied any active suicidal/homicidal  ideas, no evidence of delusional hallucinations.  Cognition well-  preserved.   ADMISSION DIAGNOSES:  AXIS I:  Mood disorder NOS, ADHD.  AXIS II:  No diagnosis.  AXIS III:  Stage I MS, irritable bowel syndrome, fibromyalgia.  AXIS IV: Moderate.  AXIS V:  On admission 35, GAF in the last year 60.   COURSE IN THE HOSPITAL:  She was admitted.  She was started individual  and group psychotherapy.  She was initially given Ambien for sleep and  Ativan as needed.  She was eventually placed on the Neurontin,  trazodone, Soma, hydrocodone Prozac 40 mg per day, Adderall 30 mg 3  times a day.  Endorsed when she was 40 years old, she  saw her primary  care Tyra Michelle.  The parents were divorcing and she was given Prozac.  She kept it for 6 months, then no medication, doing relatively well.  In  2005, her children were born, emotional roller-coaster.  She was placed  on Zoloft that she quit, and then she was placed on Lexapro and then  Wellbutrin.  Meanwhile, she had had the Xanax and trazodone.  From the  Xanax, she was switched to Klonopin, from the Klonopin to the Valium.  June 2009 to April 2010 some symptomatology pain.  MRI suggestive of  stage I MS.  Had seen 2 neurologists at [facility].  MS specialists was  able to find 1 lesion stage I MS.  Endorsed mood swings with decreased  sleep.  She was placed out of work by Dr. Raquel James.  Dr. Raquel James was out  of her office, so she saw Dr. Donell Beers.  She was on Cymbalta and  Wellbutrin.  On May 24, she went back see Dr. Raquel James.  Dr. Donell Beers had  her on Cymbalta, Wellbutrin and Adderall.  Dr. Raquel James discontinued the  Cymbalta and Wellbutrin and put her back on Prozac.  Work on [employer], help with stress and anxiety.  On August 4, there was some   communication with her father.  She apparently had a DUI several months  prior to this admission.  She was negative for alcohol but charged with  DUI anyway.  She was also charged by [facility]  Department for open container 2 months prior to this admission.  Father  did state that she got some glue at the __________.  The patient and  husband were estranged, but she endorsed that she sees the husband on  weekends.  Overall, she got better.  Mood improved as her affect became  brighter.  She was endorsing no active suicidal ideations.  We went  ahead and discharged to outpatient to follow-up.   DISCHARGE DIAGNOSES:  AXIS I:  Mood disorder NOS, ADHD.  AXIS II:  No diagnosis.  AXIS III:  Early stage MS per history, fibromyalgia, irritable bowel  syndrome.  AXIS IV:  Moderate.  AXIS V:  On discharge 50.   DISCHARGE MEDICATIONS:  1. Prozac 40 mg per day.  2. Adderall 30 mg 3 times a day.  3. Valium 10 mg twice a day as needed.  4. Neurontin 300 mg as 3 times a day.  5. Soma 3 times a day.   FOLLOWUP:  Follow-up with Dr. Raquel James and Raymondo Band for psychotherapy.      Geoffery Lyons, M.D.  Electronically Signed     IL/MEDQ  D:  01/02/2009  T:  01/03/2009  Job:  [id number]

## 2010-09-19 NOTE — Assessment & Plan Note (Signed)
[facility]                           GASTROENTEROLOGY OFFICE NOTE   Brandi, Santiago                        MRN:          [id number]  DATE:02/23/2006                            DOB:          Feb 12, 1971    Brandi Santiago complains of constant nausea in her epigastric area, worsening reflux  symptoms, lower abdominal discomfort, diarrhea, all refractory to all  medical treatment and previous evaluations.  She had a trial of Xifaxan  without improvement.  She is currently on Librax, twice daily Nexium, and  four times daily Carafate.  She has young children in diapers, but there are  no sick family members at home.  She denies dysphagia but does have nausea  and some weight loss.  Has a high stress level and is being treated by her  primary care physician with Wellbutrin 300 mg a day.  She denies psychotic  symptomatology.  She has had no melena or hematochezia.   Since I last saw her, she had her uterus removed by Dr. Kerby Moors at [facility]  because of her endometriosis but did not have her ovaries excised.  She says  that she feels much better, in terms of her GYN complaints, although this is  unclear to me since she has still lower abdominal pain issues.   She denies fever, chills, skin rashes, joint pain, oral stomatitis, or any  hepatobiliary complaints.   We did a recent ultrasound exam in March of this past year, and we did a CT  scan this past year which was otherwise unremarkable except for some  inflammatory changes felt ovarian in nature.  She also had an ultrasound  exam done in our office on January 28, 2006, which is entirely normal.   Lab data is not available at this time.  She did have gastric and esophageal  biopsies done in January of this year, both of which were unremarkable  without evidence of intestinal metaplasia or H. pylori infection.   PHYSICAL EXAMINATION:  VITAL SIGNS:  Exam shows her to weigh 122 pounds.  The blood  pressure was 90/58.  Pulse was 80 and regular.  GENERAL:  She was a somewhat anxious white female in no acute distress  without stigmata of chronic liver disease.  CHEST:  Clear.  HEART:  There were no murmurs, rubs or gallops noted.  She is in a regular  rhythm.  ABDOMEN:  I could not appreciate hepatosplenomegaly, abdominal masses, or  significant tenderness.  Bowel sounds were normal.  RECTAL:  Deferred.  EXTREMITIES:  Peripheral extremities were unremarkable.  NEUROLOGIC:  There was no focal gross neurologic deficits.   ASSESSMENT:  I remain perplexed about Malayjah's symptomatology, which I think  is mostly irritable bowel syndrome.  She complains of severe nausea and is  taking Zofran a couple of times a day and really does note have symptoms of  delayed gastric emptying.  Considerations would be a gastrointestinal  motility disorder and possible Giardiasis.  Empiric treatment, as mentioned  above, with Xifaxan has not helped her therapy nor has Librax therapy.   RECOMMENDATIONS:  1.  Discontinue Nexium, as this may be causing her nausea and abdominal      pain.  2. Continue four times daily Carafate.  3. An empiric trial of metronidazole 250 mg t.i.d. for 10 days.  4. Empiric treatment with Align probiotic therapy x2 weeks.  5. A trial of Reglan 5 mg before meals and at bedtime as a therapeutic      trial.  6. GI followup in two weeks' time.       [name]. Jarold Motto, MD, [place], [place]      DRP/MedQ  DD:  02/23/2006  DT:  02/24/2006  Job #:  [id number]

## 2010-09-19 NOTE — Op Note (Signed)
[facility]  Patient:    Brandi Santiago, Brandi Santiago                       MRN: [id number] Proc. Date: 09/24/99 Adm. Date:  [id number] Disc. Date: [id number] Attending:  Beatriz Chancellor Dictator:   Virl Diamond, D.M.D., M.D.                           Operative Report  PREOPERATIVE DIAGNOSES: 1. Severe mandibular sagittal deficiency. 2. 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  POSTOPERATIVE DIAGNOSES: 1. Severe mandibular sagittal deficiency. 2. 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  PROCEDURES: 1. Subapical anterior mandibular osteotomy. 2. Bilateral sagittal split osteotomy with advancement. 3. Segmental five-piece LeFort I osteotomy. 4. Extraction of teeth #5 and 12.  ATTENDING:  _____.  ASSISTANT:  Virl Diamond, D.M.D., M.D.  ANESTHESIA:  General endotracheal via nasoendotracheal intubation.  INDICATIONS FOR SURGERY:  Charrie is a 40 year old white female with a significant malocclusion secondary to mandibular sagittal deficiency, maxillary sagittal excess, inability to close anterior mandibular spaces, requiring a bilateral sagittal split osteotomy with advancement, a five-piece segmental LeFort I to fix the transverse maxillary skeletal deficiency, as well as close and upright the anterior maxillary segments.  Additionally, the patient needed an anterior mandibular subapical osteotomy to close the orthodontically procumbent anterior mandibular segment.  DESCRIPTION OF PROCEDURE:  The patient was identified in the holding area. N.P.O. status confirmed.  Past medical history reviewed.  Patient taken to the operating room, where she was placed on the OR table in a supine position and positioned for safety and comfort.  The patient was then turned over to anesthesia, where smooth induction of general anesthesia was performed without any complications and atraumatically.  Tube and eyes were then protected,  and the patient was then prepped and draped in a sterile fashion for a typical maxillofacial surgery procedure.  Attention was then turned intraorally, where using 12 cc of 2% lidocaine with 1:100,000 epinephrine, bilateral inferior alveolar nerve blocks, bilateral infraorbital nerve blocks, and infiltration along the maxillary and mandibular buccal vestibule was performed.  Additionally, infiltration was performed in the anterior mandibular buccal vestibule.  Time was allotted for hemostasis.  Attention was then turned to the right posterior mandibular buccal vestibule, where using a #15 blade, a typical BSSO incision was then performed.  Sharp dissection was then carried down to the subperiosteal plane, and the entire medial and lateral ramus and body of the mandible at the angle region was exposed by dissection in the submucoperiosteal plane.  Using a reciprocating saw, a typical BSSO osteotomy bone cut was then performed.  The area was then packed and attention was then turned to the left side, where the identical procedure was performed.  Attention was then turned to the anterior mandibular region where, using a genioplasty incision, sharp dissection was then carried down to the bone and a subperiosteal plane was then achieved, exposing the anterior mandibular region.  Using the sagittal saw, a subapical osteotomy bone cut was then performed with care taken to maintain the lingual mucosa and periosteum for blood supply to the anterior mandibular subapical region.  The cuts were then completed in a vertical plane using a 701 bur with care taken to not injure the dental roots of the teeth extending from 22 to 27.  Additionally, a bone wedge was then removed using the 701 bur to allow for the subapical osteotomy  to be retruded.  A mandibular splint was then placed, and the subapical osteotomy unit was then placed into the splint.  It was noted to be fitting well.  The splint was then  fixed using 28 gauge wire.  All bony margins were noted to be smooth and in good contact inferiorly.  Additionally, the subapical segment was noted to be positioned correctly into the splint with no bony interferences.  At this time, the subapical segment was then rigidly fixed using the Leibinger 1.7 plating system, and the placement of two four-hole three-dimensional plates was then achieved using four screws in each of 6 mm in length.  Good rigid fixation of the subapical segment had been achieved.  The area was then packed with a moist gauze.  Attention was then turned to the maxillary region where using a #15 blade, a typical LeFort I incision was then performed.  Sharp dissection was then carried down to the periosteum, and a submucoperiosteal plane was then achieved.  Bilateral infraorbital nerves were identified and protected throughout the entire procedure.  At this time, using a reciprocating saw, a typical LeFort I osteotomy cut was then performed.  Care was taken to not injure the nasal mucosa as it was dissected carefully off the pyriform aperture and the floor of nose.  Using a sagittal saw, the segmental bone cuts were then performed at the midline in between #8 and 9 and at the extraction site of #5 and 12.  Using a straight-tooth elevator and a universal extraction forceps, teeth #5 and 12 were then extracted.  The maxilla was then downfractured with care taken not to strip or tear through the nasal mucosa. At this time using a reciprocating saw, the segmental cuts were then completed with care taken not to injure the palatal mucosa.  A rongeur was then used to remove all of the bony protuberances from the maxillary crest and the medial maxillary sinus walls.  At this time, the patient was then placed into the final splint using 28 gauge wire, with each segment placed separately into the splint.  After each segment had been placed and rigidly fixed into the splint using 28  gauge wire, the intermediate splint was then used to place the patient into maxillomandibular fixation.  The maxilla was then placed  vertically with the condyles in a neutral centric position with bone-to-bone contact.  The vertical placement of the maxilla was thought to be in the correct position from the use of a glabellar pin, which had been placed prior to the beginning of the surgery, and the measurement to the orthodontic wire. The maxilla was then impacted both anteriorly and posteriorly 2 mm.  After good bony contact had been achieved and all the bony protuberances removed, the maxilla was then rigidly fixed using the 1.7 Leibinger screw fixation system with two L-plates placed on each side to complete the rigid fixation. Copious irrigation was then performed, and the area was then packed.  The patient was then removed from maxillomandibular fixation.  It was noted that the maxilla was in a good position in all three dimensions and was fitting into the splint appropriately with the rotation of the mandible.  At this time, attention was then placed to the right mandibular region, where the completion of the splint of the mandible was achieved using the stepwise osteotomes, curved osteotomes.  The inferior alveolar nerve was identified and noted to be in the distal segment.  It was noted to be intact.  Copious  irrigation was then achieved in this area, and the area was then packed. Attention was then turned to the left side, where the identical procedure was performed.  The mandible was then placed into the final splint using 74 gauge wire loop and a belt-on-suspenders type of wire fixation.  At this time, attention was then turned to the right mandibular region, where the proximal and distal segments were then rigidly fixed using three positional screws using the Leibinger 2.0 system with all screws being 12 mm in length.  All screws were bicortical positional screws.  Attention  was then turned to the left side, where the identical procedure was performed.  The patient was then released from maxillomandibular fixation, was noted to be into the splint with no shifts of midline and no open posterior bite.  Copious irrigation was then performed in both the LeFort incision and the BSSO incision sites.  Closure was then achieved of the LeFort incision using 0 Vicryl to place an alar cinch suture, and 3-0 Vicryl in a running baseball stitch fashion with a V-to-Y advancement of the closure of the maxillary incision.  Attention was then turned to the right BSSO incision, where after copious irrigation closure was achieved using 3-0 chromic gut in a running baseball stitch fashion.  Attention was then turned to the left side, where the identical procedure was performed.  This was completion of the orthodontic procedure, and at this point the patients estimated blood loss was approximately 600 cc, and there were no complications, no drains, and no dressing. DD:  09/24/99 TD:  09/29/99 Job: [id number]

## 2010-09-19 NOTE — Assessment & Plan Note (Signed)
[facility]                         GASTROENTEROLOGY OFFICE NOTE   [name], [name]                        MRN:          [id number]  DATE:05/06/2006                            DOB:          1970-09-22    Brandi Santiago is much better symptomatically since treatment with metronidazole  probiotic therapy.  She also has discontinued her Nexium, using Carafate  after meals and at bedtime along with Reglan for 4 meals in the daytime.  She has gained 5 pounds of weight, and has markedly reduced nausea, but  continues a dry nonproductive cough felt secondary to acid reflux.  She  apparently has had chest x-ray per Dr. Stevphen Rochester within the last  year.  Is on allergy shots.   I had a long talk with Allegra, and we have reviewed her problems and  medications.  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  She is no longer taking Librax or  laxatives.  I have asked her to take Activa yogurt daily as a probiotic.  I have given her some Tessalon pearls and Tessalon liquid to use for her  cough, especially at night.  She is to see me in 3 months' time or  p.r.n. basis as needed.  She is on Wellbutrin 300 mg a day.  She and her  husband continue to have marital difficulties, and I have again urged  her to seek couples' counseling.     [name]. Jarold Motto, MD, Caleen Essex, FAGA  Electronically Signed    DRP/MedQ  DD: 05/06/2006  DT: 05/06/2006  Job #: [id number]   cc:   Kerry Kass, M.D. [facility]

## 2010-09-19 NOTE — Op Note (Signed)
NAME:  Brandi Santiago, Brandi Santiago                           ACCOUNT NO.:  [id number]   MEDICAL RECORD NO.:  [id number]                   PATIENT TYPE:  AMB   LOCATION:  DAY                                  FACILITY:  [facility]   PHYSICIAN:  Velora Heckler, M.D.                DATE OF BIRTH:  [birth year]   DATE OF PROCEDURE:  03/06/2003  DATE OF DISCHARGE:                                 OPERATIVE REPORT   PREOPERATIVE DIAGNOSES:  Endometriosis, pelvic pain.   POSTOPERATIVE DIAGNOSES:  Endometriosis, pelvic pain.   PROCEDURE:  Laparoscopic appendectomy.   SURGEON:  Velora Heckler, M.D.   ANESTHESIA:  General.   ESTIMATED BLOOD LOSS:  Minimal.   PREPARATION:  Betadine.   COMPLICATIONS:  None.   INDICATIONS FOR PROCEDURE:  The patient is a 40 year old white female seen  at the request of Dr. Teodora Medici for endometriosis and pelvic pain. The  patient has had problems with infertility. She has undergone laparoscopy and  treatment of endometriosis. At the time of surgery in August 2004, she was  found to have an abnormal appearing appendix involved with endometriosis  adherent to the right pelvic sidewall. The patient now comes to surgery for  laparoscopic appendectomy.   DESCRIPTION OF PROCEDURE:  The procedure was done in OR #1 at the [facility]. The patient was brought to the operating room and  placed in a supine position on the operating room table. Following the  administration of general anesthesia, the patient was prepped and draped in  the usual strict aseptic fashion. After ascertaining that an adequate level  of anesthesia had been obtained, a supraumbilical incision was made in the  midline with a #15 blade. Dissection was carried down to the fascia. The  fascia was incised in the midline and the peritoneal cavity is entered  cautiously. A #0 Vicryl pursestring suture is placed in the fascia. A Hasson  cannula is introduced under direct vision and secured with  a pursestring  suture. The abdomen is insufflated with carbon dioxide. The laparoscope was  introduced under direct vision and the abdomen explored. The upper abdomen  appears normal. In the pelvis, there is some blood present in the cul-de-  sac. This appears fresh. Further examination shows the appendix to be  adherent to the right pelvic sidewall with the tip of the appendix behind  the right adnexa. The cecum is involved with endometrioma. Representative  photographs are taken. Operating ports are placed in the right upper  quadrant and left lower quadrant. The cecum is mobilized. Adhesions to the  appendix are mobilized and divided with the harmonic scalpel along the  superior and inferior aspects of the appendix down to the tip. The tip of  the appendix is mobilized and the appendix is delivered out of the pelvis.  The mesoappendix is transected with the harmonic scalpel. The base of  appendix was transected with an endoGIA stapler. The appendix is extracted  through the Hasson cannula without difficulty. An endometrioma on the cecum  is also bluntly excised and included as specimen. Good hemostasis is noted  along the staple line and along the peritoneal dissection. The right lower  quadrant is copiously irrigated with saline which is evacuated. The pelvis  is irrigated with saline and evacuated. Further photographs are taken for  the medical record. Ports are removed under direct vision and good  hemostasis is noted at all port sites. Pneumoperitoneum is released. A #0  Vicryl pursestring suture is tied securely. The port sites are anesthetized  with local anesthetic. All wounds are closed with interrupted 4-0 Vicryl  subcuticular sutures. The wounds are washed and dried and Benzoin and Steri-  Strips are applied. Sterile gauze dressings are applied. The patient is  awakened from anesthesia and brought to the recovery room in stable  condition. The patient tolerated the procedure  well.                                               Velora Heckler, M.D.    TMG/MEDQ  D:  03/06/2003  T:  03/06/2003  Job:  [id number]   cc:   Leatha Gilding. Mezer, M.D.  [address]  [place]  [place] [address]  Fax: [phone]

## 2010-09-19 NOTE — Assessment & Plan Note (Signed)
[facility]                           GASTROENTEROLOGY OFFICE NOTE   TOLUWANIMI, RADEBAUGH                        MRN:          [id number]  DATE:03/19/2006                            DOB:          06/29/1970    Noni is much better on the regimen as outlined on February 23, 2006.  This  seemed to confirm the impression that she has a GI motility disorder and she  has not had to use Zofran since being on Reglan, which has caused her some  diarrhea.  She also completed a month's worth of p.o. metronidazole,  although she was only supposed to take this for two days.  She is off Nexium  now and taking Carafate three to four times a day.   Her vital signs are all normal and her weight is stable at 121 pounds.  Blood pressure is 82/62, pulse was 64 and regular.  General physical exam  was not performed.   RECOMMENDATIONS:  1. Continue current regimen, but we will try to back off on Reglan dosage.  2. Hold metronidazole therapy.  3. I see no need for workup at this time concerning her blood pressure,      but will check screening laboratory parameters, including thyroid      function tests and renal profile.  4. Gastroenterology followup in one month's time.  The patient does have a      lot of personal stress in her life and may well need referral to Dr.      Dellia Cloud.     [name]. Jarold Motto, MD, Caleen Essex, FAGA  Electronically Signed    DRP/MedQ  DD: 03/19/2006  DT: 03/19/2006  Job #: [id number]

## 2010-09-19 NOTE — Op Note (Signed)
Brandi Santiago, Brandi Santiago                 ACCOUNT NO.:  [id number]   MEDICAL RECORD NO.:  [id number]          PATIENT TYPE:  AMB   LOCATION:  DSC                          FACILITY:  [facility]   PHYSICIAN:  Rodney A. Mortenson, M.D.DATE OF BIRTH:  [birth year]   DATE OF PROCEDURE:  10/23/2004  DATE OF DISCHARGE:                                 OPERATIVE REPORT   PREOPERATIVE DIAGNOSIS:  Chronic pain, right knee.   POSTOPERATIVE DIAGNOSIS:  Normal arthroscopic examination.   OPERATION:  Diagnostic arthroscopy, right knee.   SURGEON:  Lenard Galloway. Chaney Malling, M.D.   ANESTHESIA:  MAC.   PATHOLOGY:  We arthroscoped the knee and a very careful examination of the  knee was undertaken.  The patellofemoral joint was visualized.  There was  absolutely normal articular cartilage at this level.  The patella was  tracking directly in the midline.  No erosion of the posterior, medial and  lateral patella facets.  The superior pouch appeared normal with normal  synovial tissue.  No inflammatory process, no loose bodies.  The ACL was  visualized and was absolutely normal.  Both medial and lateral compartments  were then visualized.  There was absolutely normal articular cartilage of  both femoral condyles and both tibial plateaus, and both menisci were  evaluated on the dorsal and ventral surfaces and palpated with a probe, and  these were absolutely normal.  The ACL was completely normal.  Both medial  and lateral recesses were also visualized, and no pathology was seen  whatsoever.   PROCEDURE:  The patient was placed on the operating table in the supine  position with a pneumatic tourniquet about the right upper thigh.  The right  leg was placed in a leg holder.  The entire right lower extremity was  prepped with DuraPrep and draped out in the usual manner.  Two anterior  portals were made in standard fashion.  The arthroscope was introduced.  A  very prolonged examination of the knee was undertaken.  There  was absolutely  no pathology whatsoever as noted above.  Once this was completed to my  satisfaction, the knee was injected with Marcaine.  A large bulky  compression dressing was applied, and the patient returned to the recovery  room in excellent condition.  Technically, this procedure went extremely  well.   FOLLOW-UP CARE:  To my office on Monday.       RAM/MEDQ  D:  10/23/2004  T:  10/23/2004  Job:  [id number]

## 2010-10-24 ENCOUNTER — Encounter: Payer: Self-pay | Admitting: *Deleted

## 2010-10-28 ENCOUNTER — Ambulatory Visit: Payer: Self-pay | Admitting: Gastroenterology

## 2010-11-14 ENCOUNTER — Telehealth: Payer: Self-pay | Admitting: Gastroenterology

## 2010-11-14 NOTE — Telephone Encounter (Signed)
Spoke with Heidi and she will fax records over for review prior to scheduling patient. We will schedule after reviewing.

## 2010-11-17 NOTE — Telephone Encounter (Signed)
lmom for pt to call back to schedule DIRECT ECL with Propofol

## 2010-11-17 NOTE — Telephone Encounter (Signed)
SCHEDULE ENDO/COLON WITH PROPOFOL.Marland KitchenMarland Kitchen

## 2010-11-17 NOTE — Telephone Encounter (Signed)
Received faxed info from Dr Tracey Harries at [facility] at [facility] from 11/14/10 with a new dx of Anemia 285.9. Labs he had were from Dr [facility]pton[facility] ofc and her HGB was 9.4 and her HCT 30.7. Dr Everlene Other repeated the some of the labs- CBC ,Vit D and B12 levels. Levels from 11/14/10 HGB 10.5, HCT 32.3, Vit D 75, Vit B12 263.  Per nurse at Dr [facility]pton[facility] of[facility] pt was seen and or had labs d/t she takes Accutane. She cannot receive any more Accutane until her Anemia is resolved.  Pt last seen here 09/06/2009 for abd. bloating and gas. Her HGB 11.3, HCT 33.7, Iron levels, Ferritin were low. Pt cancelled her 1 year f/u on 10/28/10. Hx of IBS, IBS, Reflux, Proctitis, MS, Self Mutilation. EGD 03/20/2002 showing Barrett's, Ulcer in Antrum HH EGD 06/05/2002 showing HH COLON 05/25/2005 normal, EGD Barrett's, red spots Fundus to Antrum- neg. Path COLON 03/18/2007 Proctitis, erosions in rectum- neg. Path.  Pt is non compliant here, at Dr Dorita Sciara ofc and per Dr Bonney Leitz note from 11/14/10, he spent 50 minutes talking to her about her chronic medical conditions. Dr Jarold Motto, your next f/u is 12/05/10; [name] is seeing 2 pts daily  d/t mid levels being off. I'm not certain pt will really show up.  Is it ok to wait until 12/05/10 since her H&H aren't as low as previously thought or does she need a direct ECL? Please advise.

## 2010-11-18 NOTE — Telephone Encounter (Signed)
Spoke with pt to inform her she needs an ECL with Propofol per Dr Jarold Motto d/t her new onset of anemia. Pt will come to her PV on 11/20/10 at 3:30pm and her ECL will be on 11/26/10 at 4pm. Pt stated understanding. [place] from the beach on Wednesday, 11[place]26/10 so I made her the last appt of the day. She knows she must cancel the ECL within 24hrs in advance to avoid a fee.

## 2010-11-18 NOTE — Telephone Encounter (Signed)
Per pt request, faxed labs from Dr Everlene Other to Dr [facility] of[facility].

## 2010-11-19 ENCOUNTER — Telehealth: Payer: Self-pay | Admitting: *Deleted

## 2010-11-19 NOTE — Telephone Encounter (Signed)
Pt called to r/s her Pre Visit-done.

## 2010-11-20 ENCOUNTER — Other Ambulatory Visit: Payer: Self-pay | Admitting: Gastroenterology

## 2010-11-20 ENCOUNTER — Ambulatory Visit (AMBULATORY_SURGERY_CENTER): Payer: Medicaid Other | Admitting: *Deleted

## 2010-11-20 VITALS — Ht 66.0 in | Wt 127.7 lb

## 2010-11-20 DIAGNOSIS — D649 Anemia, unspecified: Secondary | ICD-10-CM

## 2010-11-20 MED ORDER — PEG-KCL-NACL-NASULF-NA ASC-C 100 G PO SOLR: ORAL | Status: DC

## 2010-11-20 MED ORDER — ONDANSETRON HCL 8 MG PO TABS: 8.0000 mg | ORAL_TABLET | Freq: Three times a day (TID) | ORAL | Status: DC | PRN

## 2010-11-20 NOTE — Telephone Encounter (Signed)
Ok per [employer], PA to order Zofran. LMOM informing pt.

## 2010-11-26 ENCOUNTER — Encounter: Payer: Self-pay | Admitting: Gastroenterology

## 2010-12-05 ENCOUNTER — Encounter: Payer: Medicaid Other | Admitting: Gastroenterology

## 2010-12-05 ENCOUNTER — Ambulatory Visit (AMBULATORY_SURGERY_CENTER): Payer: Medicaid Other | Admitting: Gastroenterology

## 2010-12-05 ENCOUNTER — Encounter: Payer: Self-pay | Admitting: Gastroenterology

## 2010-12-05 ENCOUNTER — Telehealth: Payer: Self-pay | Admitting: *Deleted

## 2010-12-05 VITALS — BP 118/77 | HR 82 | Temp 98.2°F | Resp 18

## 2010-12-05 DIAGNOSIS — K2270 Barrett's esophagus without dysplasia: Secondary | ICD-10-CM

## 2010-12-05 DIAGNOSIS — K210 Gastro-esophageal reflux disease with esophagitis: Secondary | ICD-10-CM

## 2010-12-05 DIAGNOSIS — D649 Anemia, unspecified: Secondary | ICD-10-CM

## 2010-12-05 DIAGNOSIS — K2940 Chronic atrophic gastritis without bleeding: Secondary | ICD-10-CM

## 2010-12-05 DIAGNOSIS — K269 Duodenal ulcer, unspecified as acute or chronic, without hemorrhage or perforation: Secondary | ICD-10-CM

## 2010-12-05 DIAGNOSIS — K219 Gastro-esophageal reflux disease without esophagitis: Secondary | ICD-10-CM

## 2010-12-05 MED ORDER — SODIUM CHLORIDE 0.9 % IV SOLN: 500.0000 mL | Freq: Once | INTRAVENOUS | Status: DC

## 2010-12-05 MED ORDER — TRAMADOL HCL 50 MG PO TABS: ORAL_TABLET | ORAL | Status: DC

## 2010-12-05 NOTE — Patient Instructions (Signed)
Please read the handouts given to you by your recovery room nurse.   Please do not take NSAIDS for two weeks per Dr. Jarold Motto.  He also wants you to take tramadol instead of excedrin.   Take your nexium every day.   We will call you if your biopsy for H-pylori is positive, because you will need antibiotics.    The other biopsy results will be mailed to you within 2 weeks.   You do have an appointment with Dr. Jarold Motto on August 30th at 8:30am.  Please confirm that appointment.   If you have any questions or comments, please call us at [phone].  Thank-you.

## 2010-12-05 NOTE — Telephone Encounter (Signed)
Dr Jarold Motto called to state that he would like patient to have tramadol 50 mg, 1 tablet by mouth every 6-8 hours as needed #100 with 2 refills. Rx has been sent to patient's pharmacy. Dr Jarold Motto also wanted patient to be seen for follow up in 3 weeks. Graciella Freer, RN has scheduled that appointment and has advised the nurses at [facility] of appointment. They will inform patient.

## 2010-12-08 ENCOUNTER — Encounter: Payer: Self-pay | Admitting: Gastroenterology

## 2010-12-08 ENCOUNTER — Telehealth: Payer: Self-pay

## 2010-12-08 DIAGNOSIS — K269 Duodenal ulcer, unspecified as acute or chronic, without hemorrhage or perforation: Secondary | ICD-10-CM

## 2010-12-08 LAB — HELICOBACTER PYLORI SCREEN-BIOPSY: UREASE: NEGATIVE

## 2010-12-08 NOTE — Telephone Encounter (Signed)

## 2010-12-10 ENCOUNTER — Encounter: Payer: Self-pay | Admitting: Gastroenterology

## 2010-12-31 ENCOUNTER — Other Ambulatory Visit: Payer: Self-pay | Admitting: Physician Assistant

## 2011-01-01 ENCOUNTER — Ambulatory Visit: Payer: Medicaid Other | Admitting: Gastroenterology

## 2011-01-07 ENCOUNTER — Telehealth: Payer: Self-pay | Admitting: Gastroenterology

## 2011-01-07 NOTE — Telephone Encounter (Signed)
Told the patient the reason we denied the Zofran and Nexium due to her no showing for an appt with Dr. Jarold Motto on 01-01-2011.  The patient said she was not notiifed about the appt on 01-01-2011 .(  We did give her this information with her Colonoscopy discharge information.  ) I made the pt an appt on Thursday 01-08-2011 at 11:30 AM.  The patient she can wait until tomorrow for the renewel of the prescriptions.

## 2011-01-08 ENCOUNTER — Ambulatory Visit: Payer: Medicaid Other | Admitting: Gastroenterology

## 2011-07-06 ENCOUNTER — Telehealth: Payer: Self-pay | Admitting: Gastroenterology

## 2011-07-07 NOTE — Telephone Encounter (Signed)
Pt with hx of ?IBS, IBD, severe Fibromyalgia, Chronic Situational Depression, GERD, Chronic Nausea, Chromic Anxiety; last OV 09/06/09. lmom for pt to call back. Dr Jarold Motto states pt needs an OV d/t her many cancellations.

## 2011-07-07 NOTE — Telephone Encounter (Signed)
Spoke with pt and she couldn't make the appt today; she will see Mike Gip, PA in the am.

## 2011-07-08 ENCOUNTER — Encounter: Payer: Self-pay | Admitting: Physician Assistant

## 2011-07-08 ENCOUNTER — Ambulatory Visit (INDEPENDENT_AMBULATORY_CARE_PROVIDER_SITE_OTHER): Payer: Medicaid Other | Admitting: Physician Assistant

## 2011-07-08 DIAGNOSIS — F909 Attention-deficit hyperactivity disorder, unspecified type: Secondary | ICD-10-CM

## 2011-07-08 DIAGNOSIS — Z8669 Personal history of other diseases of the nervous system and sense organs: Secondary | ICD-10-CM

## 2011-07-08 DIAGNOSIS — L708 Other acne: Secondary | ICD-10-CM

## 2011-07-08 DIAGNOSIS — L700 Acne vulgaris: Secondary | ICD-10-CM

## 2011-07-08 DIAGNOSIS — D509 Iron deficiency anemia, unspecified: Secondary | ICD-10-CM

## 2011-07-08 MED ORDER — HYDROCORTISONE ACETATE 25 MG RE SUPP: RECTAL | Status: DC

## 2011-07-08 MED ORDER — CILIDINIUM-CHLORDIAZEPOXIDE 2.5-5 MG PO CAPS: ORAL_CAPSULE | ORAL | Status: DC

## 2011-07-08 MED ORDER — ONDANSETRON 8 MG PO TBDP: ORAL_TABLET | ORAL | Status: DC

## 2011-07-08 NOTE — Patient Instructions (Signed)
Stop the Excedrin. We sent refills on the Zofran to [facility] [address]. We sent prescriptions also for rectal suppositories and Librax. We have given you samples of Nexium, Take 1 in the Am and before dinner.

## 2011-07-08 NOTE — Progress Notes (Signed)
Reviewed and agree with management. Bailyn Spackman T. Adline Kirshenbaum MD FACG 

## 2011-07-08 NOTE — Progress Notes (Signed)
Subjective:    Patient ID: Brandi Santiago, female    DOB: 04-Apr-1971, 41 y.o.   MRN: [id number]  HPI Brandi Santiago  is a 41 year old female known to Dr. Jarold Motto who has diagnosis of IBS and previous gastritis. She underwent a colonoscopy for evaluation of an iron deficiency anemia in August of 2012 and this was a completely negative exam. She had endoscopy in 2007 was positive for gastritis and possible Barrett's however the biopsies were negative. She also has history of chronic anxiety and depression and ADD.  She comes in today for evaluation of one-month history of diarrhea abdominal cramping and nausea along with increased gas and bloating. She says her symptoms are similar to 2007 when she underwent a workup and at that time she says she was under extreme amount of stress. She says she admits that she is very stressed again and feels that her symptoms are secondary to underlying anxiety. She's having a lot of personal and financial issues. She says she gets nauseated after she eats and therefore has been eating less and has lost about 15 pounds over the past couple of months. Also has urge for bowel movement after eating it is having 3-4 bowel movements per day generally loose. She has seen some blood on the tissue but has not noted any blood mixed in with her bowel movements. She is on Accutane for cystic acne and says that her iron levels are being followed by her dermatologist however she's not sure when she last had a CBC etc. I am not aware that we have a good explanation for her iron deficiency anemia, and she has not had any recent labs.  Patient states that Zofran has helped her in the past with nausea and Librax has helped with her diarrhea symptoms.    Review of Systems  Constitutional: Positive for appetite change and unexpected weight change.  HENT: Negative.   Eyes: Negative.   Respiratory: Negative.   Cardiovascular: Negative.   Gastrointestinal: Positive for nausea, abdominal pain  and diarrhea.  Genitourinary: Negative.   Musculoskeletal: Negative.   Skin: Negative.   Neurological: Negative.   Psychiatric/Behavioral: The patient is nervous/anxious.    Outpatient Prescriptions Prior to Visit  Medication Sig Dispense Refill  . ARIPiprazole (ABILIFY) 10 MG tablet Take 5 mg by mouth daily.       . diazepam (VALIUM) 10 MG tablet Take 10 mg by mouth every 6 (six) hours as needed.        . ferrous fumarate-iron polysaccharide complex (TANDEM) 162-115.2 MG CAPS Take 1 capsule by mouth daily with breakfast.        . SUMAtriptan (IMITREX) 6 MG/0.5ML SOLN Inject 6 mg into the skin.        Marland Kitchen topiramate (TOPAMAX) 25 MG capsule Take 100 mg by mouth daily.       . traMADol (ULTRAM) 50 MG tablet Take 1 tablet by mouth every 6-8 hours as needed  100 tablet  2  . azelastine (ASTELIN) 137 MCG/SPRAY nasal spray Place 2 sprays into the nose daily. Use in each nostril as directed       . clidinium-chlordiazePOXIDE (LIBRAX) 2.5-5 MG per capsule Take 1 capsule by mouth 3 (three) times daily as needed.        Marland Kitchen FLUoxetine (PROZAC) 40 MG capsule Take 40 mg by mouth daily.        . fluticasone (FLONASE) 50 MCG/ACT nasal spray Place 2 sprays into the nose at bedtime.        Marland Kitchen  levocetirizine (XYZAL) 5 MG tablet Take 5 mg by mouth every evening.        . ondansetron (ZOFRAN) 8 MG tablet Take 1 tablet (8 mg total) by mouth every 8 (eight) hours as needed.  20 tablet  0   No Known Allergies     Patient Active Problem List  Diagnoses  . ANXIETY, CHRONIC  . DEPRESSION  . GASTROESOPHAGEAL REFLUX DISEASE, CHRONIC  . NAUSEA, CHRONIC  . ADD (attention deficit disorder with hyperactivity)  . Hx of migraine headaches  . Acne cystica  . Fe deficiency anemia    Objective:   Physical Exam well-developed thin white female in no acute distress, pleasant blood pressure 110/70 pulse 72 weight 122. HEENT; nontraumatic normocephalic EOMI PERRLA sclera anicteric, Neck;Supple no JVD, Cardiovascular;  regular rate and rhythm with S1-S2 no murmur gallop, Pulmonary; clear bilaterally, Abdomen ;soft no focal tenderness no palpable mass or hepatosplenomegaly, no guarding, no rebound, bowel sounds are active, Rectal; exam, brown trace Hemoccult-positive stool no external lesions noted no internal hemorrhoid or fissure noted. Extremities; no clubbing cyanosis or edema she does have tattoos, skin warm and dry, Psych;mood and affect appropriate        Assessment & Plan:  #63 41 year old female with history of IBS now with exacerbation presenting with postprandial abdominal discomfort loose stools and nausea with recent weight loss. Patient admits to an extreme amount of stress and feels that her symptoms are due to underlying stress and anxiety.  #2 History of iron deficiency anemia and negative colonoscopy August 2012. Etiology is not clear she may need further diagnostic workup #3 GERD  Plan; we'll request lab from Dr. Dorita Sciara office then decide if she needs CBC iron studies and celiac panel Anusol-HC suppositories at bedtime for 10 days for probable low-grade intermittent hemorrhoidal bleeding Zofran 8 mg ODT every 12 hours as needed for nausea Increase Nexium to 40 mg by mouth twice a day x2-3 weeks, and have advised patient to discontinue use of Excedrin Add Librax 1 by mouth twice daily as needed for spasm and diarrhea Followup with Dr. Jarold Motto in one month

## 2011-07-16 ENCOUNTER — Telehealth: Payer: Self-pay | Admitting: Physician Assistant

## 2011-07-16 NOTE — Telephone Encounter (Signed)
Received 29 pages. Sent to Dr. Monica Becton. SD 07/16/11.

## 2011-07-17 ENCOUNTER — Telehealth: Payer: Self-pay | Admitting: *Deleted

## 2011-07-17 ENCOUNTER — Ambulatory Visit: Payer: Medicaid Other | Admitting: Gastroenterology

## 2011-07-17 NOTE — Telephone Encounter (Signed)
Brandi Santiago, please call pt, and let her know we finally got copies of her labs from Dr. Elease Etienne office, and labs all look good,i don't think we need to do any other labs   Per Mike Gip, PA Left a message for patient to call.

## 2011-07-17 NOTE — Telephone Encounter (Signed)
Spoke with patient and gave her [name] Esterwood, PA recommendations. 

## 2011-08-05 ENCOUNTER — Encounter: Payer: Self-pay | Admitting: *Deleted

## 2011-08-11 ENCOUNTER — Ambulatory Visit: Payer: Medicaid Other | Admitting: Gastroenterology

## 2011-08-14 ENCOUNTER — Telehealth: Payer: Self-pay | Admitting: Gastroenterology

## 2011-08-14 NOTE — Telephone Encounter (Signed)
Message copied by Arna Snipe on Fri Aug 14, 2011  9:04 AM ------      Message from: Harlow Mares D      Created: Tue Aug 11, 2011 10:21 AM       Please bill patient for no show, per Dr Jarold Motto

## 2011-10-22 ENCOUNTER — Telehealth: Payer: Self-pay | Admitting: Internal Medicine

## 2011-10-22 NOTE — Telephone Encounter (Signed)
Patient called c/o pain under left breat x 2 months. Also bloating. Worse past 2 days. Hurts with movement and deep breathing. Scared. Saw [name] Esterwood 3-13. Had f/u with Dr Jarold Motto 4-13 but no showed...... TOLD TO GO TO ER FOR EVALUATION and I would pass this along to Dr Jarold Motto

## 2011-10-23 ENCOUNTER — Telehealth: Payer: Self-pay | Admitting: Gastroenterology

## 2011-10-23 NOTE — Telephone Encounter (Signed)
Sheryn Bison, MD 10/23/2011 8:26 AM Signed  I agree, I am not this patient's primary care physician please call her and inform her that she needs a primary care doctor Yancey Flemings, MD 10/22/2011 7:00 PM Signed  Patient called c/o pain under left breat x 2 months. Also bloating. Worse past 2 days. Hurts with movement and deep breathing. Scared. Saw [name] Esterwood 3-13. Had f/u with Dr Jarold Motto 4-13 but no showed...... TOLD TO GO TO ER FOR EVALUATION and I would pass this along to Dr Jarold Motto  Verified with Dr Jarold Motto that we will not see her on an emergency basis because he thinks her s&s can be handled by her PCP and Dr Marina Goodell instructed her to go to the ER last night. She replied, "Why should she go to her PCP, when they're gonna send her to [place] for reflux!". She reports she is taking Nexium, but she knows what the problem is. Pt then asked if she could see another md here. Verified with Darcey Nora, RN that since 2 mds asked her to go to the ER or see her PCP, no one else he could see here. I also discussed all her missed appts and No Shows which she was unaware of except 08/11/11.

## 2011-10-23 NOTE — Telephone Encounter (Signed)
I agree, I am not this patient's primary care physician please call her and inform her that she needs a primary care doctor

## 2011-10-25 ENCOUNTER — Encounter (HOSPITAL_COMMUNITY): Payer: Self-pay | Admitting: *Deleted

## 2011-10-25 ENCOUNTER — Emergency Department (HOSPITAL_COMMUNITY)
Admission: EM | Admit: 2011-10-25 | Discharge: 2011-10-26 | Disposition: A | Discharge status: Home / Self Care | Payer: BC Managed Care – PPO | Attending: Emergency Medicine | Admitting: Emergency Medicine

## 2011-10-25 DIAGNOSIS — K589 Irritable bowel syndrome without diarrhea: Secondary | ICD-10-CM | POA: Insufficient documentation

## 2011-10-25 DIAGNOSIS — K219 Gastro-esophageal reflux disease without esophagitis: Secondary | ICD-10-CM | POA: Insufficient documentation

## 2011-10-25 DIAGNOSIS — Z79899 Other long term (current) drug therapy: Secondary | ICD-10-CM | POA: Insufficient documentation

## 2011-10-25 DIAGNOSIS — G35 Multiple sclerosis: Secondary | ICD-10-CM | POA: Insufficient documentation

## 2011-10-25 DIAGNOSIS — N12 Tubulo-interstitial nephritis, not specified as acute or chronic: Secondary | ICD-10-CM | POA: Insufficient documentation

## 2011-10-25 DIAGNOSIS — F341 Dysthymic disorder: Secondary | ICD-10-CM | POA: Insufficient documentation

## 2011-10-25 NOTE — ED Notes (Signed)
ZOX:WRUE<AV> Expected date:<BR> Expected time:11:20 PM<BR> Means of arrival:<BR> Comments:<BR> M50 - 40yoF Abd pain and distension, SOB

## 2011-10-25 NOTE — ED Notes (Signed)
Pt sts she has been having severe abdominal pain x 3 days. Sts that she has abdominal pain all the time but it has gotten worse in the past 2 days. Pt anxious at this time. Sts that she has been diagnosed with fibromyalgia but has not been taking her medications because she "is stubborn." Sts that her fibro pain is 15/10.

## 2011-10-25 NOTE — ED Notes (Addendum)
Received pt from [place], [place]. Pt A&Ox4 and resting comfortably. Pt states that she has fibromyalgia and has been under massive stress and has not been taking her meds. Pt states that she has had left sided ABD pain for 33mo. Pain has increased over the last three days. Pt states she has a "knot on her stomach" that has gotten larger. Pt states she has taken Aleve and Tylenol but it has not helped. Pt is c/o nausea, but no V/D. Pt denies ingesting any harmful substances.

## 2011-10-25 NOTE — ED Notes (Signed)
Per EMS: patient from home with c/o abdominal pain/ distention. Pt has chronic GI issues, but they have gotten worse in the past 2 days. P has taken muscle relaxer's but they have not helped. Patient reports SHOB and skin rash on face, ems reports anxiety. NAD

## 2011-10-26 ENCOUNTER — Emergency Department (HOSPITAL_COMMUNITY): Payer: BC Managed Care – PPO

## 2011-10-26 LAB — COMPREHENSIVE METABOLIC PANEL
CO2: 25 mEq/L (ref 19–32)
Calcium: 8.8 mg/dL (ref 8.4–10.5)
Creatinine, Ser: 1.22 mg/dL — ABNORMAL HIGH (ref 0.50–1.10)
GFR calc Af Amer: 64 mL/min — ABNORMAL LOW (ref >90)
GFR calc non Af Amer: 55 mL/min — ABNORMAL LOW (ref >90)
Glucose, Bld: 104 mg/dL — ABNORMAL HIGH (ref 70–99)

## 2011-10-26 LAB — POCT I-STAT, CHEM 8
HCT: 32 % — ABNORMAL LOW (ref 36.0–46.0)
Hemoglobin: 10.9 g/dL — ABNORMAL LOW (ref 12.0–15.0)
Potassium: 3.2 mEq/L — ABNORMAL LOW (ref 3.5–5.1)
Sodium: 139 mEq/L (ref 135–145)

## 2011-10-26 LAB — URINALYSIS, ROUTINE W REFLEX MICROSCOPIC
Nitrite: NEGATIVE
Specific Gravity, Urine: 1.026 (ref 1.005–1.030)
Urobilinogen, UA: 0.2 mg/dL (ref 0.0–1.0)

## 2011-10-26 LAB — DIFFERENTIAL
Eosinophils Relative: 1 % (ref 0–5)
Lymphs Abs: 0.8 10*3/uL (ref 0.7–4.0)
Monocytes Absolute: 1.5 10*3/uL — ABNORMAL HIGH (ref 0.1–1.0)

## 2011-10-26 LAB — URINE MICROSCOPIC-ADD ON

## 2011-10-26 LAB — CBC
MCH: 32.2 pg (ref 26.0–34.0)
MCV: 94.6 fL (ref 78.0–100.0)
Platelets: 316 10*3/uL (ref 150–400)
RBC: 3.14 MIL/uL — ABNORMAL LOW (ref 3.87–5.11)
RDW: 12.8 % (ref 11.5–15.5)

## 2011-10-26 MED ORDER — IOHEXOL 300 MG/ML  SOLN
100.0000 mL | Freq: Once | INTRAMUSCULAR | Status: AC | PRN
  Administered 2011-10-26: 100 mL via INTRAVENOUS

## 2011-10-26 MED ORDER — SULFAMETHOXAZOLE-TRIMETHOPRIM 800-160 MG PO TABS: 1.0000 | ORAL_TABLET | Freq: Two times a day (BID) | ORAL | Status: DC

## 2011-10-26 MED ORDER — MORPHINE SULFATE 4 MG/ML IJ SOLN
4.0000 mg | Freq: Once | INTRAMUSCULAR | Status: AC
  Administered 2011-10-26: 4 mg via INTRAVENOUS
  Filled 2011-10-26: qty 1

## 2011-10-26 MED ORDER — SODIUM CHLORIDE 0.9 % IV BOLUS (SEPSIS)
1000.0000 mL | Freq: Once | INTRAVENOUS | Status: AC
  Administered 2011-10-26: 1000 mL via INTRAVENOUS

## 2011-10-26 MED ORDER — DEXTROSE 5 % IV SOLN
1.0000 g | Freq: Once | INTRAVENOUS | Status: AC
  Administered 2011-10-26: 1 g via INTRAVENOUS
  Filled 2011-10-26: qty 10

## 2011-10-26 MED ORDER — LORAZEPAM 2 MG/ML IJ SOLN
1.0000 mg | Freq: Once | INTRAMUSCULAR | Status: AC
  Administered 2011-10-26: 1 mg via INTRAVENOUS
  Filled 2011-10-26: qty 1

## 2011-10-26 MED ORDER — ONDANSETRON HCL 4 MG/2ML IJ SOLN
4.0000 mg | Freq: Once | INTRAMUSCULAR | Status: AC
  Administered 2011-10-26: 4 mg via INTRAVENOUS
  Filled 2011-10-26: qty 2

## 2011-10-26 MED ORDER — POTASSIUM CHLORIDE 10 MEQ/100ML IV SOLN
10.0000 meq | Freq: Once | INTRAVENOUS | Status: AC
  Administered 2011-10-26: 10 meq via INTRAVENOUS
  Filled 2011-10-26: qty 100

## 2011-10-26 MED ORDER — OXYCODONE-ACETAMINOPHEN 10-325 MG PO TABS
1.0000 | ORAL_TABLET | ORAL | Status: AC | PRN
Start: 2011-10-26 — End: 2011-11-05

## 2011-10-26 NOTE — ED Provider Notes (Signed)
History     CSN: [id number]  Arrival date & time 10/25/11  2321   First MD Initiated Contact with Patient 10/26/11 0006      Chief Complaint  Patient presents with  . Abdominal Pain    chronic, worse last 2 days   HPI  History provided by the patient. Patient is a 41 year old female with history of IBS, ulcerative colitis, MS, anemia who presents with complaints of abdominal pain, distention and nausea symptoms for the past 2 days. Symptoms began gradually and have been progressively worsening. Symptoms have been associated with decreased appetite. Patient has low abdominal pain and pressure does radiate to low back. Patient denies any episodes of vomiting but does report baseline episodes of loose watery diarrhea type stools a time she eats or drinks. This has been chronic. Patient denies any blood or mucus in stool. Patient has taken some muscle relaxers from old prescription at home without significant relief of symptoms. Denies any other aggravating or alleviating factors. Denies any dysuria, hematuria urinary frequency. Denies any vaginal bleeding or vaginal discharge. Denies any fever, chills, sweats. Patient also has secondary complaint of slight rash or bumps on her face. Patient states she does have a bad habit of "picking" at her skin and does have small scabs on arms and legs from "picking".    Past Medical History  Diagnosis Date  . Irritable bowel syndrome   . Other and unspecified noninfectious gastroenteritis and colitis   . Depressive disorder, not elsewhere classified   . Anxiety state, unspecified   . Nausea alone   . Esophageal reflux   . Ulcerative (chronic) proctitis   . MS (multiple sclerosis)   . Barrett esophagus   . Allergy   . Anemia   . Depressive disorder, not elsewhere classified   . Hiatal hernia   . Iron deficiency anemia, unspecified     Past Surgical History  Procedure Date  . Nasal sinus surgery   . Mandible fracture surgery   . Appendectomy    . Cesarean section   . Partial hysterectomy     Family History  Problem Relation Age of Onset  . Colon cancer Neg Hx   . Diabetes Paternal Grandmother   . Diabetes Paternal Grandfather   . Clotting disorder Father     History  Substance Use Topics  . Smoking status: Never Smoker   . Smokeless tobacco: Never Used  . Alcohol Use: Yes     rare    OB History    Grav Para Term Preterm Abortions TAB SAB Ect Mult Living                  Review of Systems  Constitutional: Positive for appetite change. Negative for fever and chills.  Respiratory: Negative for cough and shortness of breath.   Gastrointestinal: Positive for nausea, abdominal pain and abdominal distention. Negative for vomiting, diarrhea and constipation.  Genitourinary: Negative for dysuria, frequency, hematuria, flank pain, vaginal bleeding and vaginal discharge.    Allergies  Review of patient's allergies indicates no known allergies.  Home Medications   Current Outpatient Rx  Name Route Sig Dispense Refill  . AMPHETAMINE-DEXTROAMPHET ER 30 MG PO CP24 Oral Take 30 mg by mouth every morning.    . ARIPIPRAZOLE 5 MG PO TABS Oral Take 2.5 mg by mouth daily.    Marland Kitchen DIAZEPAM 10 MG PO TABS Oral Take 10 mg by mouth every 6 (six) hours as needed.      Marland Kitchen ESOMEPRAZOLE MAGNESIUM  40 MG PO CPDR Oral Take 40 mg by mouth daily before breakfast.    . FLUTICASONE PROPIONATE 50 MCG/ACT NA SUSP Nasal Place 2 sprays into the nose daily.    Marland Kitchen LEVOCETIRIZINE DIHYDROCHLORIDE 5 MG PO TABS Oral Take 5 mg by mouth every evening.    Marland Kitchen ONDANSETRON 8 MG PO TBDP  Take 1 tab on the tongue twice daily as needed for nausea 60 tablet 2  . PAROXETINE HCL 10 MG PO TABS Oral Take 10 mg by mouth every morning.    . TRAZODONE HCL 50 MG PO TABS Oral Take 50 mg by mouth at bedtime.    Marland Kitchen CLINDINIUM-CHLORDIAZEPOXIDE 2.5-5 MG PO CAPS  Take 1 tab twice daily as needed  For cramping and diarrhea. 60 capsule 3  . SUMATRIPTAN SUCCINATE 6 MG/0.5ML Keys SOLN  Subcutaneous Inject 6 mg into the skin.      . TOPIRAMATE 25 MG PO CPSP Oral Take 100 mg by mouth daily.       BP 122/75  Pulse 135  Temp 98.7 F (37.1 C) (Oral)  Resp 20  Ht 5\' 6"  (1.676 m)  Wt 120 lb (54.432 kg)  BMI 19.37 kg/m2  SpO2 95%  Physical Exam  Nursing note and vitals reviewed. Constitutional: She is oriented to person, place, and time. She appears well-developed and well-nourished. No distress.  HENT:  Head: Normocephalic.  Cardiovascular: Regular rhythm.  Tachycardia present.   Pulmonary/Chest: Effort normal and breath sounds normal. No respiratory distress. She has no wheezes.  Abdominal: Soft. She exhibits distension. There is tenderness. There is no rebound and no guarding.       Diffuse tenderness with mild distention. Hyperactive bowel sounds.  Neurological: She is alert and oriented to person, place, and time.  Skin: Skin is warm and dry.       Several scabs at several locations of extremities and forehead and face, consistent with history of "picking" or scratching.  Psychiatric: She has a normal mood and affect. Her behavior is normal.    ED Course  Procedures   Results for orders placed during the hospital encounter of 10/25/11  COMPREHENSIVE METABOLIC PANEL      Component Value Range   Sodium 135  135 - 145 mEq/L   Potassium 3.2 (*) 3.5 - 5.1 mEq/L   Chloride 98  96 - 112 mEq/L   CO2 25  19 - 32 mEq/L   Glucose, Bld 104 (*) 70 - 99 mg/dL   BUN 19  6 - 23 mg/dL   Creatinine, Ser 1.61 (*) 0.50 - 1.10 mg/dL   Calcium 8.8  8.4 - 09.6 mg/dL   Total Protein 6.5  6.0 - 8.3 g/dL   Albumin 2.4 (*) 3.5 - 5.2 g/dL   AST 87 (*) 0 - 37 U/L   ALT 80 (*) 0 - 35 U/L   Alkaline Phosphatase 123 (*) 39 - 117 U/L   Total Bilirubin 0.2 (*) 0.3 - 1.2 mg/dL   GFR calc non Af Amer 55 (*) >90 mL/min   GFR calc Af Amer 64 (*) >90 mL/min  URINALYSIS, ROUTINE W REFLEX MICROSCOPIC      Component Value Range   Color, Urine YELLOW  YELLOW   APPearance CLOUDY (*) CLEAR     Specific Gravity, Urine 1.026  1.005 - 1.030   pH 5.5  5.0 - 8.0   Glucose, UA NEGATIVE  NEGATIVE mg/dL   Hgb urine dipstick LARGE (*) NEGATIVE   Bilirubin Urine SMALL (*) NEGATIVE  Ketones, ur NEGATIVE  NEGATIVE mg/dL   Protein, ur 562 (*) NEGATIVE mg/dL   Urobilinogen, UA 0.2  0.0 - 1.0 mg/dL   Nitrite NEGATIVE  NEGATIVE   Leukocytes, UA SMALL (*) NEGATIVE  POCT I-STAT, CHEM 8      Component Value Range   Sodium 139  135 - 145 mEq/L   Potassium 3.2 (*) 3.5 - 5.1 mEq/L   Chloride 101  96 - 112 mEq/L   BUN 19  6 - 23 mg/dL   Creatinine, Ser 1.30 (*) 0.50 - 1.10 mg/dL   Glucose, Bld 865 (*) 70 - 99 mg/dL   Calcium, Ion 7.84 (*) 1.12 - 1.32 mmol/L   TCO2 24  0 - 100 mmol/L   Hemoglobin 10.9 (*) 12.0 - 15.0 g/dL   HCT 69.6 (*) 29.5 - 28.4 %  URINE MICROSCOPIC-ADD ON      Component Value Range   Squamous Epithelial / LPF RARE  RARE   WBC, UA 21-50  <3 WBC/hpf   RBC / HPF 21-50  <3 RBC/hpf   Bacteria, UA MANY (*) RARE   Urine-Other MUCOUS PRESENT    CBC      Component Value Range   WBC 25.0 (*) 4.0 - 10.5 K/uL   RBC 3.14 (*) 3.87 - 5.11 MIL/uL   Hemoglobin 10.1 (*) 12.0 - 15.0 g/dL   HCT 13.2 (*) 44.0 - 10.2 %   MCV 94.6  78.0 - 100.0 fL   MCH 32.2  26.0 - 34.0 pg   MCHC 34.0  30.0 - 36.0 g/dL   RDW 72.5  36.6 - 44.0 %   Platelets 316  150 - 400 K/uL  DIFFERENTIAL      Component Value Range   Neutrophils Relative 90 (*) 43 - 77 %   Lymphocytes Relative 3 (*) 12 - 46 %   Monocytes Relative 6  3 - 12 %   Eosinophils Relative 1  0 - 5 %   Basophils Relative 0  0 - 1 %   Neutro Abs 22.4 (*) 1.7 - 7.7 K/uL   Lymphs Abs 0.8  0.7 - 4.0 K/uL   Monocytes Absolute 1.5 (*) 0.1 - 1.0 K/uL   Eosinophils Absolute 0.3  0.0 - 0.7 K/uL   Basophils Absolute 0.0  0.0 - 0.1 K/uL   WBC Morphology TOXIC GRANULATION     Smear Review LARGE PLATELETS PRESENT         Ct Abdomen Pelvis W Contrast  10/26/2011  *RADIOLOGY REPORT*  Clinical Data: Left-sided abdominal pain for 3 months.   Nausea. White cell count 25.  Microhematuria.  History of appendectomy, partial hysterectomy, ulcerative proctitis, Barrett's esophagus, hiatal hernia, and multiple sclerosis.  CT ABDOMEN AND PELVIS WITH CONTRAST  Technique:  Multidetector CT imaging of the abdomen and pelvis was performed following the standard protocol during bolus administration of intravenous contrast.  Contrast: OMNIPAQUE IOHEXOL 300 MG/ML  SOLN  Comparison: 07/13/2005  Findings: Dependent atelectasis in the lung bases.  There is mild periportal edema which can be a normal variant or reflect inflammatory change in the liver.  The liver, spleen, gallbladder, pancreas, adrenal glands, and abdominal aorta are unremarkable.  There is a small accessory spleen.  Para-aortic retroperitoneal lymph nodes are moderately prominent as are the celiac axis lymph nodes.  There is no definite pathologic lymphadenopathy and changes may be reactive. The stomach, small bowel, and colon are not significantly distended.  The colon is diffusely stool filled.  No free air or  free fluid in the abdomen.  The left kidney is enlarged in comparison to the right and demonstrates a heterogeneous striated nephrogram with mild pararenal stranding most consistent with pyelonephritis.  Lymphoma involvement is not excluded and follow-up examination after resolution of acute symptoms is recommended.  Pelvis:  The uterus and adnexal structures are not pathologically enlarged.  The bladder wall is not thickened.  No free or loculated pelvic fluid collection.  The appendix is surgically absent by history.  No evidence of diverticulitis.  Normal alignment of the lumbar vertebrae.  IMPRESSION: Enlarged left kidney with heterogeneous nephrogram most consistent with pyelonephritis.  Lymphoma involvement is not excluded and follow-up examination after resolution of acute symptoms is recommended.  Prominence of celiac axis and retroperitoneal lymph nodes without pathologic  lymphadenopathy, likely reactive. Periportal edema which is likely normal variation although inflammatory process could have this appearance.  Original Report Authenticated By: Marlon Pel, M.D.     1. Pyelonephritis       MDM  Patient seen and evaluated. Patient no acute distress.  Patient feeling better after pain medication and Zofran. Patient does tolerate by mouth fluids. UA was signs for UTI. CAT scan also was signs concerning for pyelonephritis. We'll begin as by smear with 1 g of ceftriaxone and plan a prescription outpatient.  I discussed findings with patient and treatment plan. She is in agreement and followup with her PCP for additional evaluation and treatment.      Angus Seller, [place] 10/26/11 2013

## 2011-10-26 NOTE — Discharge Instructions (Signed)
You were seen and evaluated for your complaints of nausea vomiting and abdominal pains. You were found to have a kidney infection. You were given a dose of antibiotics in the emergency room and prescriptions for additional antibiotics to continue taking for the next 10 days. Please take these for the full length of time and followup with your primary care provider. Return to emergency room if you have any worsening symptoms, persistent nausea vomiting, fever, worsening pain. Please call your doctor this week to schedule your followup appointment to have a recheck of your lab testing and symptoms.    Pyelonephritis, Adult Pyelonephritis is a kidney infection. In general, there are 2 main types of pyelonephritis:  Infections that come on quickly without any warning (acute pyelonephritis).   Infections that persist for a long period of time (chronic pyelonephritis).  CAUSES  Two main causes of pyelonephritis are:  Bacteria traveling from the bladder to the kidney. This is a problem especially in pregnant women. The urine in the bladder can become filled with bacteria from multiple causes, including:   Inflammation of the prostate gland (prostatitis).   Sexual intercourse in females.   Bladder infection (cystitis).   Bacteria traveling from the bloodstream to the tissue part of the kidney.  Problems that may increase your risk of getting a kidney infection include:  Diabetes.   Kidney stones or bladder stones.   Cancer.   Catheters placed in the bladder.   Other abnormalities of the kidney or ureter.  SYMPTOMS   Abdominal pain.   Pain in the side or flank area.   Fever.   Chills.   Upset stomach.   Blood in the urine (dark urine).   Frequent urination.   Strong or persistent urge to urinate.   Burning or stinging when urinating.  DIAGNOSIS  Your caregiver may diagnose your kidney infection based on your symptoms. A urine sample may also be taken. TREATMENT  In  general, treatment depends on how severe the infection is.   If the infection is mild and caught early, your caregiver may treat you with oral antibiotics and send you home.   If the infection is more severe, the bacteria may have gotten into the bloodstream. This will require intravenous (IV) antibiotics and a hospital stay. Symptoms may include:   High fever.   Severe flank pain.   Shaking chills.   Even after a hospital stay, your caregiver may require you to be on oral antibiotics for a period of time.   Other treatments may be required depending upon the cause of the infection.  HOME CARE INSTRUCTIONS   Take your antibiotics as directed. Finish them even if you start to feel better.   Make an appointment to have your urine checked to make sure the infection is gone.   Drink enough fluids to keep your urine clear or pale yellow.   Take medicines for the bladder if you have urgency and frequency of urination as directed by your caregiver.  SEEK IMMEDIATE MEDICAL CARE IF:   You have a fever.   You are unable to take your antibiotics or fluids.   You develop shaking chills.   You experience extreme weakness or fainting.   There is no improvement after 2 days of treatment.  MAKE SURE YOU:  Understand these instructions.   Will watch your condition.   Will get help right away if you are not doing well or get worse.  Document Released: 04/20/2005 Document Revised: 04/09/2011 Document Reviewed: 09/24/2010  ExitCare Patient Information 2012 [place].

## 2011-10-27 ENCOUNTER — Encounter (HOSPITAL_COMMUNITY): Payer: Self-pay | Admitting: *Deleted

## 2011-10-27 ENCOUNTER — Inpatient Hospital Stay (HOSPITAL_COMMUNITY)
Admission: EM | Admit: 2011-10-27 | Discharge: 2011-10-29 | DRG: 320 | Disposition: A | Discharge status: Home / Self Care | Payer: BC Managed Care – PPO | Source: Ambulatory Visit | Attending: Internal Medicine | Admitting: Internal Medicine

## 2011-10-27 DIAGNOSIS — K219 Gastro-esophageal reflux disease without esophagitis: Secondary | ICD-10-CM | POA: Diagnosis present

## 2011-10-27 DIAGNOSIS — D509 Iron deficiency anemia, unspecified: Secondary | ICD-10-CM | POA: Diagnosis present

## 2011-10-27 DIAGNOSIS — G35 Multiple sclerosis: Secondary | ICD-10-CM | POA: Diagnosis present

## 2011-10-27 DIAGNOSIS — F411 Generalized anxiety disorder: Secondary | ICD-10-CM

## 2011-10-27 DIAGNOSIS — D649 Anemia, unspecified: Secondary | ICD-10-CM | POA: Diagnosis present

## 2011-10-27 DIAGNOSIS — A498 Other bacterial infections of unspecified site: Secondary | ICD-10-CM | POA: Diagnosis present

## 2011-10-27 DIAGNOSIS — E876 Hypokalemia: Secondary | ICD-10-CM | POA: Diagnosis present

## 2011-10-27 DIAGNOSIS — N12 Tubulo-interstitial nephritis, not specified as acute or chronic: Principal | ICD-10-CM

## 2011-10-27 DIAGNOSIS — F329 Major depressive disorder, single episode, unspecified: Secondary | ICD-10-CM

## 2011-10-27 DIAGNOSIS — F429 Obsessive-compulsive disorder, unspecified: Secondary | ICD-10-CM | POA: Diagnosis present

## 2011-10-27 DIAGNOSIS — I776 Arteritis, unspecified: Secondary | ICD-10-CM | POA: Diagnosis present

## 2011-10-27 DIAGNOSIS — R945 Abnormal results of liver function studies: Secondary | ICD-10-CM | POA: Diagnosis present

## 2011-10-27 DIAGNOSIS — Z833 Family history of diabetes mellitus: Secondary | ICD-10-CM

## 2011-10-27 DIAGNOSIS — R109 Unspecified abdominal pain: Secondary | ICD-10-CM | POA: Diagnosis present

## 2011-10-27 DIAGNOSIS — Z90710 Acquired absence of both cervix and uterus: Secondary | ICD-10-CM

## 2011-10-27 DIAGNOSIS — R7989 Other specified abnormal findings of blood chemistry: Secondary | ICD-10-CM | POA: Diagnosis present

## 2011-10-27 DIAGNOSIS — F3289 Other specified depressive episodes: Secondary | ICD-10-CM | POA: Diagnosis present

## 2011-10-27 DIAGNOSIS — K589 Irritable bowel syndrome without diarrhea: Secondary | ICD-10-CM | POA: Diagnosis present

## 2011-10-27 LAB — URINE MICROSCOPIC-ADD ON

## 2011-10-27 LAB — COMPREHENSIVE METABOLIC PANEL
ALT: 76 U/L — ABNORMAL HIGH (ref 0–35)
Albumin: 2.2 g/dL — ABNORMAL LOW (ref 3.5–5.2)
Alkaline Phosphatase: 157 U/L — ABNORMAL HIGH (ref 39–117)
Calcium: 8.8 mg/dL (ref 8.4–10.5)
Potassium: 2.9 mEq/L — ABNORMAL LOW (ref 3.5–5.1)
Sodium: 135 mEq/L (ref 135–145)
Total Protein: 6.4 g/dL (ref 6.0–8.3)

## 2011-10-27 LAB — DIFFERENTIAL
Basophils Absolute: 0 10*3/uL (ref 0.0–0.1)
Eosinophils Absolute: 0.2 10*3/uL (ref 0.0–0.7)
Lymphocytes Relative: 4 % — ABNORMAL LOW (ref 12–46)
Neutrophils Relative %: 90 % — ABNORMAL HIGH (ref 43–77)

## 2011-10-27 LAB — CBC
Hemoglobin: 10.4 g/dL — ABNORMAL LOW (ref 12.0–15.0)
MCHC: 33.9 g/dL (ref 30.0–36.0)
Platelets: 378 10*3/uL (ref 150–400)
RDW: 13.1 % (ref 11.5–15.5)

## 2011-10-27 LAB — URINE CULTURE: Culture  Setup Time: 201306241222

## 2011-10-27 LAB — URINALYSIS, ROUTINE W REFLEX MICROSCOPIC
Glucose, UA: NEGATIVE mg/dL
Nitrite: NEGATIVE
Protein, ur: 100 mg/dL — AB
pH: 6 (ref 5.0–8.0)

## 2011-10-27 MED ORDER — HYDROMORPHONE HCL 2 MG PO TABS: 1.0000 mg | ORAL_TABLET | Freq: Once | ORAL | Status: DC

## 2011-10-27 MED ORDER — MORPHINE SULFATE 4 MG/ML IJ SOLN
4.0000 mg | Freq: Once | INTRAMUSCULAR | Status: AC
  Administered 2011-10-27: 4 mg via INTRAVENOUS
  Filled 2011-10-27: qty 1

## 2011-10-27 MED ORDER — SODIUM CHLORIDE 0.9 % IV SOLN
INTRAVENOUS | Status: AC
  Administered 2011-10-28: via INTRAVENOUS

## 2011-10-27 MED ORDER — DEXTROSE 5 % IV SOLN
1.0000 g | Freq: Once | INTRAVENOUS | Status: AC
  Administered 2011-10-28: 1 g via INTRAVENOUS
  Filled 2011-10-27: qty 10

## 2011-10-27 MED ORDER — SODIUM CHLORIDE 0.9 % IV BOLUS (SEPSIS)
1000.0000 mL | Freq: Once | INTRAVENOUS | Status: AC
  Administered 2011-10-27: 1000 mL via INTRAVENOUS

## 2011-10-27 MED ORDER — ONDANSETRON HCL 4 MG/2ML IJ SOLN
4.0000 mg | Freq: Once | INTRAMUSCULAR | Status: AC
  Administered 2011-10-27: 4 mg via INTRAVENOUS
  Filled 2011-10-27: qty 2

## 2011-10-27 MED ORDER — HYDROMORPHONE HCL PF 1 MG/ML IJ SOLN
INTRAMUSCULAR | Status: AC
  Administered 2011-10-27: 1 mg via INTRAVENOUS
  Filled 2011-10-27: qty 1

## 2011-10-27 MED ORDER — HYDROMORPHONE HCL PF 1 MG/ML IJ SOLN
1.0000 mg | INTRAMUSCULAR | Status: DC | PRN
  Administered 2011-10-27: 1 mg via INTRAVENOUS

## 2011-10-27 NOTE — ED Notes (Addendum)
Patient states "pain in stomach x 2 months".  + Nausea.  Seen at [facility] on 6/24-6/25 and diagnosis of  pyelonephritis.  She was sent home on antibiotics and pain medications.  Seen [facility] today and was told to come here for evaluation of elevated WBC.  Patient states "her Percocet are doing no good and request pain medications be changed while here".  Patient states "threw away Percocet because it did not work".

## 2011-10-27 NOTE — ED Provider Notes (Signed)
History     CSN: [id number]  Arrival date & time 10/27/11  1727   First MD Initiated Contact with Patient 10/27/11 1817      Chief Complaint  Patient presents with  . Abdominal Pain    (Consider location/radiation/quality/duration/timing/severity/associated sxs/prior treatment) HPI  41 year old female with multiple medical problems including multiple sclerosis, ulcerative proctitis, irritable bowel syndrome, recent diagnosis of anemia, presenting after several weeks of abdominal left-sided pain to [facility] long yesterday, where workup revealed leukocytosis, proteinuria, multiple new skin lesions, and presumed pyelonephritis secondary to urinary findings as well as CT scan results.  She presents today with ongoing left middle quadrant and left upper quadrant abdominal pain 10/10. She denies any vomiting. She endorses rapid heart rate, and anxiety. Her spine also hurts. She's been picking at her lesions. She denies any headache, weakness, numbness. On arrival temperature 97.8, pulse 140, respirations 18, blood pressure 109/68, SpO2 94% on room air.  Past Medical History  Diagnosis Date  . Irritable bowel syndrome   . Other and unspecified noninfectious gastroenteritis and colitis   . Depressive disorder, not elsewhere classified   . Anxiety state, unspecified   . Nausea alone   . Esophageal reflux   . Ulcerative (chronic) proctitis   . MS (multiple sclerosis)   . Barrett esophagus   . Allergy   . Anemia   . Depressive disorder, not elsewhere classified   . Hiatal hernia   . Iron deficiency anemia, unspecified     Past Surgical History  Procedure Date  . Nasal sinus surgery   . Mandible fracture surgery   . Appendectomy   . Cesarean section   . Partial hysterectomy     Family History  Problem Relation Age of Onset  . Colon cancer Neg Hx   . Diabetes Paternal Grandmother   . Diabetes Paternal Grandfather   . Clotting disorder Father     History  Substance Use Topics    . Smoking status: Never Smoker   . Smokeless tobacco: Never Used  . Alcohol Use: Yes     rare    OB History    Grav Para Term Preterm Abortions TAB SAB Ect Mult Living                  Review of Systems Constitutional: Negative for fever and chills.  HENT: Negative for ear pain, sore throat and trouble swallowing.   Eyes: Negative for pain and visual disturbance.  Respiratory: POS for cough and shortness of breath.   Cardiovascular: Negative for chest pain and leg swelling.  Gastrointestinal: POS for nausea, neg vomiting, POS abdominal pain and neg diarrhea.  Genitourinary: Negative for dysuria, urgency and frequency.  Musculoskeletal: Negative for back pain and joint swelling.  Skin: Negative for rash and wound.  Neurological: Negative for dizziness, syncope, speech difficulty, weakness and numbness.   Allergies  Review of patient's allergies indicates no known allergies.  Home Medications   Current Outpatient Rx  Name Route Sig Dispense Refill  . AMPHETAMINE-DEXTROAMPHET ER 30 MG PO CP24 Oral Take 30 mg by mouth every morning.    . ARIPIPRAZOLE 5 MG PO TABS Oral Take 2.5 mg by mouth daily.    Marland Kitchen CLINDINIUM-CHLORDIAZEPOXIDE 2.5-5 MG PO CAPS  Take 1 tab twice daily as needed  For cramping and diarrhea. 60 capsule 3  . DIAZEPAM 10 MG PO TABS Oral Take 10 mg by mouth every 6 (six) hours as needed. For anxiety    . ESOMEPRAZOLE MAGNESIUM 40 MG PO  CPDR Oral Take 40 mg by mouth daily before breakfast.    . FLUTICASONE PROPIONATE 50 MCG/ACT NA SUSP Nasal Place 2 sprays into the nose daily.    Marland Kitchen LEVOCETIRIZINE DIHYDROCHLORIDE 5 MG PO TABS Oral Take 5 mg by mouth every evening.    Marland Kitchen ONDANSETRON 8 MG PO TBDP  Take 1 tab on the tongue twice daily as needed for nausea 60 tablet 2  . OXYCODONE-ACETAMINOPHEN 10-325 MG PO TABS Oral Take 1 tablet by mouth every 4 (four) hours as needed for pain. 15 tablet 0  . PAROXETINE HCL 10 MG PO TABS Oral Take 10 mg by mouth every morning.    .  SULFAMETHOXAZOLE-TRIMETHOPRIM 800-160 MG PO TABS Oral Take 1 tablet by mouth every 12 (twelve) hours. 20 tablet 0  . SUMATRIPTAN SUCCINATE 6 MG/0.5ML [place] SOLN Subcutaneous Inject 6 mg into the skin.      . TOPIRAMATE 25 MG PO CPSP Oral Take 100 mg by mouth daily.     . TRAZODONE HCL 50 MG PO TABS Oral Take 50 mg by mouth at bedtime.      BP 102/67  Pulse 118  Temp 97.2 F (36.2 C) (Oral)  Resp 18  SpO2 97%  Physical Exam Consitutional: Pt in no acute distress.   Head: Normocephalic and atraumatic.  Eyes: Extraocular motion intact, no scleral icterus Neck: Supple without meningismus, mass, or overt JVD Respiratory: Effort normal and breath sounds normal. No respiratory distress. CV: Heart regular rate and regular rhythm (sinus), no obvious murmurs.  Pulses +2 and symmetric Abdomen: Soft, mild TTP LUQ, non-distended. No rebound or guarding.  MSK: Extremities are atraumatic without deformity, ROM intact Skin: Warm, dry, numerous raised rough lesions about the 4 head and cheeks about the risks and several on the lower dorsal foot.  Neuro: Alert and oriented, no motor deficit noted.   Psychiatric: Mood and affect are normal    ED Course  Procedures (including critical care time)  Labs Reviewed  CBC - Abnormal; Notable for the following:    WBC 19.5 (*)     RBC 3.26 (*)     Hemoglobin 10.4 (*)     HCT 30.7 (*)     All other components within normal limits  DIFFERENTIAL - Abnormal; Notable for the following:    Neutrophils Relative 90 (*)     Lymphocytes Relative 4 (*)     Neutro Abs 17.5 (*)     All other components within normal limits  COMPREHENSIVE METABOLIC PANEL - Abnormal; Notable for the following:    Potassium 2.9 (*)     Creatinine, Ser 1.25 (*)     Albumin 2.2 (*)     AST 74 (*)     ALT 76 (*)     Alkaline Phosphatase 157 (*)     Total Bilirubin 0.2 (*)     GFR calc non Af Amer 54 (*)     GFR calc Af Amer 62 (*)     All other components within normal limits    SEDIMENTATION RATE - Abnormal; Notable for the following:    Sed Rate 116 (*)     All other components within normal limits  URINALYSIS, ROUTINE W REFLEX MICROSCOPIC - Abnormal; Notable for the following:    APPearance CLOUDY (*)     Hgb urine dipstick LARGE (*)     Protein, ur 100 (*)     Leukocytes, UA SMALL (*)     All other components within normal limits  URINE  MICROSCOPIC-ADD ON - Abnormal; Notable for the following:    Squamous Epithelial / LPF FEW (*)     Bacteria, UA FEW (*)     All other components within normal limits  LACTIC ACID, PLASMA  C-REACTIVE PROTEIN  CULTURE, BLOOD (ROUTINE X 2)  CULTURE, BLOOD (ROUTINE X 2)  URINE CULTURE   Ct Abdomen Pelvis W Contrast  10/26/2011  *RADIOLOGY REPORT*  Clinical Data: Left-sided abdominal pain for 3 months.  Nausea. White cell count 25.  Microhematuria.  History of appendectomy, partial hysterectomy, ulcerative proctitis, Barrett's esophagus, hiatal hernia, and multiple sclerosis.  CT ABDOMEN AND PELVIS WITH CONTRAST  Technique:  Multidetector CT imaging of the abdomen and pelvis was performed following the standard protocol during bolus administration of intravenous contrast.  Contrast: OMNIPAQUE IOHEXOL 300 MG/ML  SOLN  Comparison: 07/13/2005  Findings: Dependent atelectasis in the lung bases.  There is mild periportal edema which can be a normal variant or reflect inflammatory change in the liver.  The liver, spleen, gallbladder, pancreas, adrenal glands, and abdominal aorta are unremarkable.  There is a small accessory spleen.  Para-aortic retroperitoneal lymph nodes are moderately prominent as are the celiac axis lymph nodes.  There is no definite pathologic lymphadenopathy and changes may be reactive. The stomach, small bowel, and colon are not significantly distended.  The colon is diffusely stool filled.  No free air or free fluid in the abdomen.  The left kidney is enlarged in comparison to the right and demonstrates a  heterogeneous striated nephrogram with mild pararenal stranding most consistent with pyelonephritis.  Lymphoma involvement is not excluded and follow-up examination after resolution of acute symptoms is recommended.  Pelvis:  The uterus and adnexal structures are not pathologically enlarged.  The bladder wall is not thickened.  No free or loculated pelvic fluid collection.  The appendix is surgically absent by history.  No evidence of diverticulitis.  Normal alignment of the lumbar vertebrae.  IMPRESSION: Enlarged left kidney with heterogeneous nephrogram most consistent with pyelonephritis.  Lymphoma involvement is not excluded and follow-up examination after resolution of acute symptoms is recommended.  Prominence of celiac axis and retroperitoneal lymph nodes without pathologic lymphadenopathy, likely reactive. Periportal edema which is likely normal variation although inflammatory process could have this appearance.  Original Report Authenticated By: Marlon Pel, M.D.     1. Pyelonephritis       MDM  Unclear etiology of the patient's recent symptoms. 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  I did not initially complete blood cultures on this patient because she's been given antibiotics for 24 hours.   Studies have returned. The patient's sedimentation rate is over 120.  She seems to have a small bump in her AST ALT, creatinine, she has100 protein in her urine. Again this corroborates my concern for vasculitides. She will be admitted to medicine after antibiotics and blood cultures for further workup and management.  Patient admitted stable.        Larrie Kass, MD 10/28/11 [phone]

## 2011-10-27 NOTE — ED Notes (Signed)
Pt arrived eating large salad , RN explained that she should not eat or drink anymore at this time.

## 2011-10-27 NOTE — ED Notes (Signed)
PT upset about not having a room and having to wait in waiting area. RN spoke with pt and found her a comfortable area to sit and wait for room. Charge RN made aware and room is being cleaned at this time for her. Pt is comfortable. Pt advocate made aware as well.

## 2011-10-27 NOTE — ED Notes (Signed)
Per physicians office-[facility] Pt was diagnosed with pylonephritis. Pt continues to have pain. Pt reported to be tachycardic at 130-150.

## 2011-10-28 ENCOUNTER — Encounter (HOSPITAL_COMMUNITY): Payer: Self-pay | Admitting: Internal Medicine

## 2011-10-28 DIAGNOSIS — F988 Other specified behavioral and emotional disorders with onset usually occurring in childhood and adolescence: Secondary | ICD-10-CM

## 2011-10-28 DIAGNOSIS — F329 Major depressive disorder, single episode, unspecified: Secondary | ICD-10-CM

## 2011-10-28 DIAGNOSIS — R112 Nausea with vomiting, unspecified: Secondary | ICD-10-CM

## 2011-10-28 DIAGNOSIS — F429 Obsessive-compulsive disorder, unspecified: Secondary | ICD-10-CM

## 2011-10-28 DIAGNOSIS — R109 Unspecified abdominal pain: Secondary | ICD-10-CM | POA: Diagnosis present

## 2011-10-28 DIAGNOSIS — K589 Irritable bowel syndrome without diarrhea: Secondary | ICD-10-CM | POA: Diagnosis present

## 2011-10-28 DIAGNOSIS — R945 Abnormal results of liver function studies: Secondary | ICD-10-CM | POA: Diagnosis present

## 2011-10-28 DIAGNOSIS — R5381 Other malaise: Secondary | ICD-10-CM

## 2011-10-28 DIAGNOSIS — N12 Tubulo-interstitial nephritis, not specified as acute or chronic: Secondary | ICD-10-CM

## 2011-10-28 DIAGNOSIS — D649 Anemia, unspecified: Secondary | ICD-10-CM | POA: Diagnosis present

## 2011-10-28 DIAGNOSIS — R21 Rash and other nonspecific skin eruption: Secondary | ICD-10-CM

## 2011-10-28 LAB — CBC
HCT: 24.4 % — ABNORMAL LOW (ref 36.0–46.0)
Hemoglobin: 8.5 g/dL — ABNORMAL LOW (ref 12.0–15.0)
WBC: 16.2 10*3/uL — ABNORMAL HIGH (ref 4.0–10.5)

## 2011-10-28 LAB — COMPREHENSIVE METABOLIC PANEL
BUN: 12 mg/dL (ref 6–23)
Calcium: 7.6 mg/dL — ABNORMAL LOW (ref 8.4–10.5)
GFR calc Af Amer: 67 mL/min — ABNORMAL LOW (ref >90)
Glucose, Bld: 109 mg/dL — ABNORMAL HIGH (ref 70–99)
Sodium: 133 mEq/L — ABNORMAL LOW (ref 135–145)
Total Protein: 5.2 g/dL — ABNORMAL LOW (ref 6.0–8.3)

## 2011-10-28 LAB — DIFFERENTIAL
Basophils Relative: 0 % (ref 0–1)
Eosinophils Relative: 1 % (ref 0–5)
Lymphocytes Relative: 5 % — ABNORMAL LOW (ref 12–46)
Monocytes Relative: 7 % (ref 3–12)
Neutro Abs: 14.1 10*3/uL — ABNORMAL HIGH (ref 1.7–7.7)

## 2011-10-28 LAB — URINE CULTURE: Colony Count: NO GROWTH

## 2011-10-28 LAB — IRON AND TIBC

## 2011-10-28 LAB — FERRITIN: Ferritin: 188 ng/mL (ref 10–291)

## 2011-10-28 LAB — C-REACTIVE PROTEIN: CRP: 41.05 mg/dL — ABNORMAL HIGH (ref <0.60)

## 2011-10-28 LAB — RHEUMATOID FACTOR: Rhuematoid fact SerPl-aCnc: 27 IU/mL — ABNORMAL HIGH (ref <=14)

## 2011-10-28 LAB — RETICULOCYTES: Retic Ct Pct: <0.4 % — ABNORMAL LOW (ref 0.4–3.1)

## 2011-10-28 LAB — VITAMIN B12: Vitamin B-12: >2000 pg/mL — ABNORMAL HIGH (ref 211–911)

## 2011-10-28 MED ORDER — TOPIRAMATE 100 MG PO TABS
100.0000 mg | ORAL_TABLET | Freq: Every day | ORAL | Status: DC
  Administered 2011-10-28 – 2011-10-29 (×2): 100 mg via ORAL
  Filled 2011-10-28 (×2): qty 1

## 2011-10-28 MED ORDER — PANTOPRAZOLE SODIUM 40 MG PO TBEC
40.0000 mg | DELAYED_RELEASE_TABLET | Freq: Every day | ORAL | Status: DC
  Administered 2011-10-28 – 2011-10-29 (×2): 40 mg via ORAL
  Filled 2011-10-28 (×2): qty 1

## 2011-10-28 MED ORDER — ONDANSETRON HCL 4 MG PO TABS: 4.0000 mg | ORAL_TABLET | Freq: Four times a day (QID) | ORAL | Status: DC | PRN

## 2011-10-28 MED ORDER — FLUVOXAMINE MALEATE 50 MG PO TABS
50.0000 mg | ORAL_TABLET | Freq: Every day | ORAL | Status: DC
  Administered 2011-10-28: 50 mg via ORAL
  Filled 2011-10-28 (×2): qty 1

## 2011-10-28 MED ORDER — POTASSIUM CHLORIDE 10 MEQ/100ML IV SOLN: 10.0000 meq | INTRAVENOUS | Status: DC

## 2011-10-28 MED ORDER — ONDANSETRON HCL 4 MG/2ML IJ SOLN: 4.0000 mg | Freq: Four times a day (QID) | INTRAMUSCULAR | Status: DC | PRN

## 2011-10-28 MED ORDER — LORAZEPAM 1 MG PO TABS
1.0000 mg | ORAL_TABLET | Freq: Once | ORAL | Status: AC
  Administered 2011-10-28: 1 mg via ORAL
  Filled 2011-10-28: qty 1

## 2011-10-28 MED ORDER — ACETAMINOPHEN 325 MG PO TABS: 650.0000 mg | ORAL_TABLET | Freq: Four times a day (QID) | ORAL | Status: DC | PRN

## 2011-10-28 MED ORDER — POTASSIUM CHLORIDE IN NACL 40-0.9 MEQ/L-% IV SOLN
INTRAVENOUS | Status: DC
  Administered 2011-10-28 (×2): via INTRAVENOUS
  Filled 2011-10-28 (×5): qty 1000

## 2011-10-28 MED ORDER — PAROXETINE HCL 10 MG PO TABS
10.0000 mg | ORAL_TABLET | Freq: Every day | ORAL | Status: DC
  Administered 2011-10-28: 10 mg via ORAL
  Filled 2011-10-28: qty 1

## 2011-10-28 MED ORDER — OXYCODONE HCL 5 MG PO TABS
5.0000 mg | ORAL_TABLET | ORAL | Status: DC | PRN
  Administered 2011-10-28 – 2011-10-29 (×5): 5 mg via ORAL
  Filled 2011-10-28 (×6): qty 1

## 2011-10-28 MED ORDER — ARIPIPRAZOLE 5 MG PO TABS
2.5000 mg | ORAL_TABLET | Freq: Every day | ORAL | Status: DC
  Administered 2011-10-28 – 2011-10-29 (×2): 2.5 mg via ORAL
  Filled 2011-10-28 (×2): qty 1

## 2011-10-28 MED ORDER — OXYCODONE-ACETAMINOPHEN 5-325 MG PO TABS
1.0000 | ORAL_TABLET | ORAL | Status: DC | PRN
  Administered 2011-10-28 – 2011-10-29 (×4): 1 via ORAL
  Filled 2011-10-28 (×5): qty 1

## 2011-10-28 MED ORDER — FLUTICASONE PROPIONATE 50 MCG/ACT NA SUSP
2.0000 | Freq: Every day | NASAL | Status: DC
  Administered 2011-10-28 – 2011-10-29 (×2): 2 via NASAL
  Filled 2011-10-28: qty 16

## 2011-10-28 MED ORDER — HYDROMORPHONE HCL PF 1 MG/ML IJ SOLN: 1.0000 mg | Freq: Once | INTRAMUSCULAR | Status: DC

## 2011-10-28 MED ORDER — POTASSIUM CHLORIDE CRYS ER 20 MEQ PO TBCR
40.0000 meq | EXTENDED_RELEASE_TABLET | Freq: Three times a day (TID) | ORAL | Status: DC
  Administered 2011-10-28 – 2011-10-29 (×5): 40 meq via ORAL
  Filled 2011-10-28 (×6): qty 2

## 2011-10-28 MED ORDER — SODIUM CHLORIDE 0.9 % IV SOLN
Freq: Once | INTRAVENOUS | Status: AC
  Administered 2011-10-28: 12:00:00 via INTRAVENOUS

## 2011-10-28 MED ORDER — AMPHETAMINE-DEXTROAMPHET ER 10 MG PO CP24
30.0000 mg | ORAL_CAPSULE | Freq: Every day | ORAL | Status: DC
  Administered 2011-10-28 – 2011-10-29 (×2): 30 mg via ORAL
  Filled 2011-10-28 (×2): qty 3

## 2011-10-28 MED ORDER — ACETAMINOPHEN 650 MG RE SUPP: 650.0000 mg | Freq: Four times a day (QID) | RECTAL | Status: DC | PRN

## 2011-10-28 MED ORDER — LEVOCETIRIZINE DIHYDROCHLORIDE 5 MG PO TABS: 5.0000 mg | ORAL_TABLET | Freq: Every evening | ORAL | Status: DC

## 2011-10-28 MED ORDER — DIAZEPAM 5 MG PO TABS: 10.0000 mg | ORAL_TABLET | Freq: Four times a day (QID) | ORAL | Status: DC | PRN

## 2011-10-28 MED ORDER — SUMATRIPTAN SUCCINATE 50 MG PO TABS
50.0000 mg | ORAL_TABLET | ORAL | Status: DC | PRN
Start: 2011-10-28 — End: 2011-10-29
  Administered 2011-10-28 – 2011-10-29 (×3): 50 mg via ORAL
  Filled 2011-10-28 (×5): qty 1

## 2011-10-28 MED ORDER — SODIUM CHLORIDE 0.9 % IJ SOLN: 3.0000 mL | Freq: Two times a day (BID) | INTRAMUSCULAR | Status: DC

## 2011-10-28 MED ORDER — LIDOCAINE VISCOUS 2 % MT SOLN
20.0000 mL | OROMUCOSAL | Status: DC | PRN
  Administered 2011-10-29: 20 mL via OROMUCOSAL
  Filled 2011-10-28 (×2): qty 20

## 2011-10-28 MED ORDER — OXYCODONE-ACETAMINOPHEN 10-325 MG PO TABS: 1.0000 | ORAL_TABLET | ORAL | Status: DC | PRN

## 2011-10-28 MED ORDER — TRAZODONE HCL 50 MG PO TABS
50.0000 mg | ORAL_TABLET | Freq: Every day | ORAL | Status: DC
  Administered 2011-10-28: 50 mg via ORAL
  Filled 2011-10-28 (×2): qty 1

## 2011-10-28 MED ORDER — OXYCODONE-ACETAMINOPHEN 5-325 MG PO TABS
1.0000 | ORAL_TABLET | Freq: Once | ORAL | Status: AC
  Administered 2011-10-28: 1 via ORAL
  Filled 2011-10-28: qty 1

## 2011-10-28 MED ORDER — LORATADINE 10 MG PO TABS
10.0000 mg | ORAL_TABLET | Freq: Every evening | ORAL | Status: DC
  Administered 2011-10-28 – 2011-10-29 (×2): 10 mg via ORAL
  Filled 2011-10-28 (×2): qty 1

## 2011-10-28 MED ORDER — DEXTROSE 5 % IV SOLN
1.0000 g | INTRAVENOUS | Status: DC
  Administered 2011-10-28: 1 g via INTRAVENOUS
  Filled 2011-10-28 (×2): qty 10

## 2011-10-28 NOTE — Progress Notes (Signed)
Utilization review complete 

## 2011-10-28 NOTE — H&P (Addendum)
Brandi Santiago is an 41 y.o. female.  PCP - Dr.Bouska. Chief Complaint: Abdominal pain and skin lesions. HPI: 41 year-old female who had come to [facility] hospital 2 nights ago with left flank pain and at that time had CT abdomen pelvis which showed left-sided pyelonephritis and was discharged home on Bactrim. Since the pain persisted she had called her PCP who had advised her to go back to the ER. Patient is to febrile and the pain is more left flank area. She also noticed that her abdomen is beginning distended. She also has noticed new skin lesions forming around the face wrists and legs. They started developing over the last 3-4 days. Patient also has been having chronic joint pains and also has developed ulcers on her tongue. Denies any nausea vomiting or diarrhea. Denies any chest pain shortness of breath. At this time patient has been admitted for further observation and management.  Past Medical History  Diagnosis Date  . Irritable bowel syndrome   . Other and unspecified noninfectious gastroenteritis and colitis   . Depressive disorder, not elsewhere classified   . Anxiety state, unspecified   . Nausea alone   . Esophageal reflux   . Ulcerative (chronic) proctitis   . MS (multiple sclerosis)   . Barrett esophagus   . Allergy   . Anemia   . Depressive disorder, not elsewhere classified   . Hiatal hernia   . Iron deficiency anemia, unspecified     Past Surgical History  Procedure Date  . Nasal sinus surgery   . Mandible fracture surgery   . Appendectomy   . Cesarean section   . Partial hysterectomy     Family History  Problem Relation Age of Onset  . Colon cancer Neg Hx   . Diabetes Paternal Grandmother   . Diabetes Paternal Grandfather   . Clotting disorder Father    Social History:  reports that she has never smoked. She has never used smokeless tobacco. She reports that she drinks alcohol. She reports that she does not use illicit drugs.  Allergies: No Known  Allergies  Medications Prior to Admission  Medication Sig Dispense Refill  . amphetamine-dextroamphetamine (ADDERALL XR) 30 MG 24 hr capsule Take 30 mg by mouth every morning.      . ARIPiprazole (ABILIFY) 5 MG tablet Take 2.5 mg by mouth daily.      . clidinium-chlordiazePOXIDE (LIBRAX) 2.5-5 MG per capsule Take 1 tab twice daily as needed  For cramping and diarrhea.  60 capsule  3  . diazepam (VALIUM) 10 MG tablet Take 10 mg by mouth every 6 (six) hours as needed. For anxiety      . esomeprazole (NEXIUM) 40 MG capsule Take 40 mg by mouth daily before breakfast.      . fluticasone (FLONASE) 50 MCG/ACT nasal spray Place 2 sprays into the nose daily.      Marland Kitchen levocetirizine (XYZAL) 5 MG tablet Take 5 mg by mouth every evening.      . ondansetron (ZOFRAN-ODT) 8 MG disintegrating tablet Take 1 tab on the tongue twice daily as needed for nausea  60 tablet  2  . oxyCODONE-acetaminophen (PERCOCET) 10-325 MG per tablet Take 1 tablet by mouth every 4 (four) hours as needed for pain.  15 tablet  0  . PARoxetine (PAXIL) 10 MG tablet Take 10 mg by mouth every morning.      . sulfamethoxazole-trimethoprim (SEPTRA DS) 800-160 MG per tablet Take 1 tablet by mouth every 12 (twelve) hours.  20 tablet  0  . SUMAtriptan (IMITREX) 6 MG/0.5ML SOLN Inject 6 mg into the skin.        Marland Kitchen topiramate (TOPAMAX) 25 MG capsule Take 100 mg by mouth daily.       . traZODone (DESYREL) 50 MG tablet Take 50 mg by mouth at bedtime.        Results for orders placed during the hospital encounter of 10/27/11 (from the past 48 hour(s))  CBC     Status: Abnormal   Collection Time   10/27/11  7:59 PM      Component Value Range Comment   WBC 19.5 (*) 4.0 - 10.5 K/uL    RBC 3.26 (*) 3.87 - 5.11 MIL/uL    Hemoglobin 10.4 (*) 12.0 - 15.0 g/dL    HCT 32.4 (*) 40.1 - 46.0 %    MCV 94.2  78.0 - 100.0 fL    MCH 31.9  26.0 - 34.0 pg    MCHC 33.9  30.0 - 36.0 g/dL    RDW 02.7  25.3 - 66.4 %    Platelets 378  150 - 400 K/uL     DIFFERENTIAL     Status: Abnormal   Collection Time   10/27/11  7:59 PM      Component Value Range Comment   Neutrophils Relative 90 (*) 43 - 77 %    Lymphocytes Relative 4 (*) 12 - 46 %    Monocytes Relative 5  3 - 12 %    Eosinophils Relative 1  0 - 5 %    Basophils Relative 0  0 - 1 %    Neutro Abs 17.5 (*) 1.7 - 7.7 K/uL    Lymphs Abs 0.8  0.7 - 4.0 K/uL    Monocytes Absolute 1.0  0.1 - 1.0 K/uL    Eosinophils Absolute 0.2  0.0 - 0.7 K/uL    Basophils Absolute 0.0  0.0 - 0.1 K/uL    WBC Morphology MILD LEFT SHIFT (1-5% METAS, OCC MYELO, OCC BANDS)     COMPREHENSIVE METABOLIC PANEL     Status: Abnormal   Collection Time   10/27/11  7:59 PM      Component Value Range Comment   Sodium 135  135 - 145 mEq/L    Potassium 2.9 (*) 3.5 - 5.1 mEq/L    Chloride 98  96 - 112 mEq/L    CO2 23  19 - 32 mEq/L    Glucose, Bld 77  70 - 99 mg/dL    BUN 19  6 - 23 mg/dL    Creatinine, Ser 4.03 (*) 0.50 - 1.10 mg/dL    Calcium 8.8  8.4 - 47.4 mg/dL    Total Protein 6.4  6.0 - 8.3 g/dL    Albumin 2.2 (*) 3.5 - 5.2 g/dL    AST 74 (*) 0 - 37 U/L    ALT 76 (*) 0 - 35 U/L    Alkaline Phosphatase 157 (*) 39 - 117 U/L    Total Bilirubin 0.2 (*) 0.3 - 1.2 mg/dL    GFR calc non Af Amer 54 (*) >90 mL/min    GFR calc Af Amer 62 (*) >90 mL/min   LACTIC ACID, PLASMA     Status: Normal   Collection Time   10/27/11  7:59 PM      Component Value Range Comment   Lactic Acid, Venous 1.3  0.5 - 2.2 mmol/L   SEDIMENTATION RATE     Status: Abnormal   Collection Time  10/27/11  7:59 PM      Component Value Range Comment   Sed Rate 116 (*) 0 - 22 mm/hr   URINALYSIS, ROUTINE W REFLEX MICROSCOPIC     Status: Abnormal   Collection Time   10/27/11  8:08 PM      Component Value Range Comment   Color, Urine YELLOW  YELLOW    APPearance CLOUDY (*) CLEAR    Specific Gravity, Urine 1.018  1.005 - 1.030    pH 6.0  5.0 - 8.0    Glucose, UA NEGATIVE  NEGATIVE mg/dL    Hgb urine dipstick LARGE (*) NEGATIVE     Bilirubin Urine NEGATIVE  NEGATIVE    Ketones, ur NEGATIVE  NEGATIVE mg/dL    Protein, ur 562 (*) NEGATIVE mg/dL    Urobilinogen, UA 1.0  0.0 - 1.0 mg/dL    Nitrite NEGATIVE  NEGATIVE    Leukocytes, UA SMALL (*) NEGATIVE   URINE MICROSCOPIC-ADD ON     Status: Abnormal   Collection Time   10/27/11  8:08 PM      Component Value Range Comment   Squamous Epithelial / LPF FEW (*) RARE    WBC, UA 21-50  <3 WBC/hpf    RBC / HPF TOO NUMEROUS TO COUNT  <3 RBC/hpf    Bacteria, UA FEW (*) RARE    Ct Abdomen Pelvis W Contrast  10/26/2011  *RADIOLOGY REPORT*  Clinical Data: Left-sided abdominal pain for 3 months.  Nausea. White cell count 25.  Microhematuria.  History of appendectomy, partial hysterectomy, ulcerative proctitis, Barrett's esophagus, hiatal hernia, and multiple sclerosis.  CT ABDOMEN AND PELVIS WITH CONTRAST  Technique:  Multidetector CT imaging of the abdomen and pelvis was performed following the standard protocol during bolus administration of intravenous contrast.  Contrast: OMNIPAQUE IOHEXOL 300 MG/ML  SOLN  Comparison: 07/13/2005  Findings: Dependent atelectasis in the lung bases.  There is mild periportal edema which can be a normal variant or reflect inflammatory change in the liver.  The liver, spleen, gallbladder, pancreas, adrenal glands, and abdominal aorta are unremarkable.  There is a small accessory spleen.  Para-aortic retroperitoneal lymph nodes are moderately prominent as are the celiac axis lymph nodes.  There is no definite pathologic lymphadenopathy and changes may be reactive. The stomach, small bowel, and colon are not significantly distended.  The colon is diffusely stool filled.  No free air or free fluid in the abdomen.  The left kidney is enlarged in comparison to the right and demonstrates a heterogeneous striated nephrogram with mild pararenal stranding most consistent with pyelonephritis.  Lymphoma involvement is not excluded and follow-up examination after  resolution of acute symptoms is recommended.  Pelvis:  The uterus and adnexal structures are not pathologically enlarged.  The bladder wall is not thickened.  No free or loculated pelvic fluid collection.  The appendix is surgically absent by history.  No evidence of diverticulitis.  Normal alignment of the lumbar vertebrae.  IMPRESSION: Enlarged left kidney with heterogeneous nephrogram most consistent with pyelonephritis.  Lymphoma involvement is not excluded and follow-up examination after resolution of acute symptoms is recommended.  Prominence of celiac axis and retroperitoneal lymph nodes without pathologic lymphadenopathy, likely reactive. Periportal edema which is likely normal variation although inflammatory process could have this appearance.  Original Report Authenticated By: Marlon Pel, M.D.    Review of Systems  Constitutional: Negative.   HENT: Negative.   Eyes: Negative.   Respiratory: Negative.   Cardiovascular: Negative.   Gastrointestinal: Positive  for abdominal pain.  Genitourinary: Negative.   Musculoskeletal: Positive for joint pain.  Skin: Negative.   Neurological: Negative.   Endo/Heme/Allergies: Negative.   Psychiatric/Behavioral: Negative.     Blood pressure 112/70, pulse 129, temperature 101.3 F (38.5 C), temperature source Oral, resp. rate 22, height 5\' 6"  (1.676 m), weight 60.4 kg (133 lb 2.5 oz), SpO2 94.00%. Physical Exam  Constitutional: She is oriented to person, place, and time. She appears well-developed and well-nourished. No distress.  HENT:  Head: Normocephalic and atraumatic.  Right Ear: External ear normal.  Left Ear: External ear normal.  Mouth/Throat: Oropharynx is clear and moist.       Ulcers on the dorsum of tongue.  Eyes: Conjunctivae are normal. Pupils are equal, round, and reactive to light. Right eye exhibits no discharge. Left eye exhibits no discharge. No scleral icterus.  Neck: Normal range of motion. Neck supple.    Cardiovascular: Normal rate and regular rhythm.   Respiratory: Effort normal and breath sounds normal. No respiratory distress. She has no wheezes. She has no rales.  GI: Soft. Bowel sounds are normal. She exhibits distension (mild distension.). There is no tenderness. There is no rebound.  Musculoskeletal: Normal range of motion. She exhibits no edema and no tenderness.  Neurological: She is alert and oriented to person, place, and time.       Moves all extremities.  Skin: She is not diaphoretic.       Multiple skin lesion specifically has on on the right naso labial fold, right ear, left hand and left foot. Scared scaly lesions on forehead and cheeks.  Psychiatric: Her behavior is normal.     Assessment/Plan #1. Pyelonephritis - patient's urine culture done 2 days ago shows Escherichia coli growth sensitive to ceftriaxone which will be ordered. Blood cultures also has been ordered. #2. Abdominal pain - this is more left flank and corresponds to the pyelonephritis. Patient's LFTs mildly elevated and also has periportal edema. I did discuss with radiologist at this time CAT scan does not show any portal vein thrombosis. May get an opinion from GI in a.m. At this time we are going to closely follow her LFTs to make sure it is not worsening. #3. Lymphadenopathy in the CAT scan - this could be reactive to the pyelonephritis. But radiologist has also reported concerning for possible lymphoma. A repeat CAT scan has been done after resolution of pyelonephritis as outpatient. At this time we will check LDH. If LDH is high may need to consult oncologist in a.m. #4. Skin lesions - concerning for vasculitis. As patient's skin lesions are painful. Patient's sedimentation rate is high. Patient also has oral ulcers and anemia. At this time we will get ANA, rheumatoid factor, ANCA titers. Patient may need skin biopsy. Patient does follow with a dermatologist Dr. Terri Piedra. The contact number is [phone]. #5.  Anemia with known history of iron deficiency - patient follows up about GI and has had a colonoscopy last year and EGD previously. Closely follow CBC. #6. Hypokalemia - replace and recheck.  CODE STATUS - full code.  Kaydon Husby N. 10/28/2011, 2:15 AM

## 2011-10-28 NOTE — Progress Notes (Addendum)
CRITICAL VALUE ALERT  Critical value received:  K: 2.7  Date of notification:  10/28/2011   Time of notification:  7:44 AM   Critical value read back:yes  Nurse who received alert:  Kennyth Arnold, RN  MD notified (1st page):  [facility] Ortiz(no attending yet)  Time of first page:  0745  MD notified (2nd page):  Time of second page:  Responding MD:  David Stall  Time MD responded:  [phone]

## 2011-10-28 NOTE — Progress Notes (Signed)
Spoke with pt friend who stated that they have only known each other one week, but she is concerned about her picking. Pt will pick at scabs and make herself bleed because she thinks there are "bugs in her skin". Pt also receives B12 injections at home and has not had a dose in "quite a while". I informed pt's friend to write down medications that the pt can remember and the dosages for the MD later. Imitrex ordered for headache. Will continue to monitor.

## 2011-10-28 NOTE — ED Provider Notes (Signed)
I have seen and examined this patient with the resident.  I agree with the resident's note, assessment and plan except as indicated.     Nyzier Boivin A Raahim Shartzer, MD 10/28/11 0045 

## 2011-10-28 NOTE — Progress Notes (Signed)
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 Psychiatry suggested her to see her regular psychiatrist to adjust medications for OCD, but is not looking to start her on a new OCD medication while she is in the hospital. I will notify Dr. Blake Divine of family and friend request.
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
## 2011-10-28 NOTE — Progress Notes (Signed)
[facility] PROGRESS NOTE  Brandi Santiago ZOX:[id number] DOB: [birth year] DOA: 10/27/2011 PCP: Aura Dials, MD  Assessment/Plan: 1. Pyelonephritis - patient's urine culture done 2 days ago shows Escherichia coli growth sensitive to ceftriaxone which will be ordered. Blood cultures also has been ordered.  #2. Abdominal pain - this is more left flank and corresponds to the pyelonephritis. Patient's LFTs mildly elevated and also has periportal edema. I did discuss with radiologist at this time CAT scan does not show any portal vein thrombosis. The LFT'S are improving. Outpatient follow up.  #3. Lymphadenopathy in the CAT scan - this could be reactive to the pyelonephritis. But radiologist has also reported concerning for possible lymphoma. A repeat CAT scan to be done after resolution of pyelonephritis as outpatient. LDH is slightly high. Will get oncology input in am.  #4. Skin lesions - from picking? As per the patient, she feels bugs crawling over her body and picks at her wounds at times of stress and she reports she is under a lot of stress lately. She also reports she is very depressed and sees a Veterinary surgeon as outpatient. questionable concern for vasculitis,?  Patient's sedimentation rate is high. Patient also has oral ulcers and anemia. At this time we will get ANA, rheumatoid factor, ANCA titers. Patient may need skin biopsy. Patient does follow with a dermatologist Dr. Terri Piedra. The contact number is [phone].  #5. Anemia with known history of iron deficiency - patient follows up about GI and has had a colonoscopy last year and EGD previously. Closely follow CBC.  #6. Hypokalemia - replace and recheck. #& Depression: no suicidal ideation, says her anti depressants are not working good. Psychiatry consult called.    Code Status: full code Disposition Plan: HOme when medically stable  Brandi Keenum, MD  [facility] Pager [phone]  If 7PM-7AM, please contact  night-coverage [email or web address] Password [email or web address] 10/28/2011, 9:50 AM   LOS: 1 day   Brief narrative: 41 year-old female who had come to [facility] 2 nights ago with left flank pain and at that time had CT abdomen pelvis which showed left-sided pyelonephritis and was discharged home on Bactrim. Since the pain persisted she had called her PCP who had advised her to go back to the ER.   Consultants:  psychiatry  Procedures:  Ct abd  Antibiotics:  rocephin  HPI/Subjective: Still has back pain. And headache and reports that she takes migraine for the headache.   Objective: Filed Vitals:   10/28/11 0118 10/28/11 0125 10/28/11 0534 10/28/11 0540  BP: 112/70  78/35 90/40  Pulse: 129  116   Temp: 102.1 F (38.9 C) 101.3 F (38.5 C) 100.3 F (37.9 C)   TempSrc: Oral  Oral   Resp: 22  20   Height: 5\' 6"  (1.676 m)     Weight: 60.4 kg (133 lb 2.5 oz)     SpO2: 94%  91%     Intake/Output Summary (Last 24 hours) at 10/28/11 0950 Last data filed at 10/28/11 0500  Gross per 24 hour  Intake      0 ml  Output      2 ml  Net     -2 ml    Exam:   General:  ALERT AFEBRILE COMFORTABLE  Cardiovascular: S!S2 heard RRR, no MRG  Respiratory: CTAB, no wheezing  Or rhonchi  Abdomen: soft MILDLY TENDER ND BS+, CVA TENDERNESS PRESENT  Extremities; no pedal edema.   Skin and scalp: multiple scabs with blood from picking .  Data Reviewed: Basic Metabolic Panel:  Lab 10/28/11 1610 10/27/11 1959 10/26/11 0003 10/25/11 2355  NA 133* 135 139 135  K 2.7* 2.9* 3.2* 3.2*  CL 98 98 101 98  CO2 21 23 -- 25  GLUCOSE 109* 77 106* 104*  BUN 12 19 19 19   CREATININE 1.18* 1.25* 1.50* 1.22*  CALCIUM 7.6* 8.8 -- 8.8  MG -- -- -- --  PHOS -- -- -- --   Liver Function Tests:  Lab 10/28/11 0615 10/27/11 1959 10/25/11 2355  AST 38* 74* 87*  ALT 54* 76* 80*  ALKPHOS 150* 157* 123*  BILITOT 0.1* 0.2* 0.2*  PROT 5.2* 6.4 6.5  ALBUMIN 1.7* 2.2* 2.4*   No results found for this  basename: LIPASE:5,AMYLASE:5 in the last 168 hours No results found for this basename: AMMONIA:5 in the last 168 hours CBC:  Lab 10/28/11 0615 10/27/11 1959 10/26/11 0003 10/25/11 2350  WBC 16.2* 19.5* -- 25.0*  NEUTROABS 14.1* 17.5* -- 22.4*  HGB 8.5* 10.4* 10.9* 10.1*  HCT 24.4* 30.7* 32.0* 29.7*  MCV 93.1 94.2 -- 94.6  PLT 331 378 -- 316   Cardiac Enzymes: No results found for this basename: CKTOTAL:5,CKMB:5,CKMBINDEX:5,TROPONINI:5 in the last 168 hours BNP (last 3 results) No results found for this basename: PROBNP:3 in the last 8760 hours CBG: No results found for this basename: GLUCAP:5 in the last 168 hours  Recent Results (from the past 240 hour(s))  URINE CULTURE     Status: Normal   Collection Time   10/25/11 11:50 PM      Component Value Range Status Comment   Specimen Description URINE, CLEAN CATCH   Final    Special Requests NONE   Final    Culture  Setup Time [id number]   Final    Colony Count >=100,000 COLONIES/ML   Final    Culture ESCHERICHIA COLI   Final    Report Status 10/27/2011 FINAL   Final    Organism ID, Bacteria ESCHERICHIA COLI   Final      Studies: No results found.  Scheduled Meds:   . sodium chloride   Intravenous STAT  . amphetamine-dextroamphetamine  30 mg Oral Daily  . ARIPiprazole  2.5 mg Oral Daily  . cefTRIAXone (ROCEPHIN)  IV  1 g Intravenous Once  . cefTRIAXone (ROCEPHIN)  IV  1 g Intravenous Q24H  . fluticasone  2 spray Each Nare Daily  . loratadine  10 mg Oral QPM  . LORazepam  1 mg Oral Once  .  morphine injection  4 mg Intravenous Once  .  morphine injection  4 mg Intravenous Once  . ondansetron (ZOFRAN) IV  4 mg Intravenous Once  . pantoprazole  40 mg Oral Q1200  . PARoxetine  10 mg Oral Daily  . potassium chloride  40 mEq Oral TID  . sodium chloride  1,000 mL Intravenous Once  . sodium chloride  3 mL Intravenous Q12H  . topiramate  100 mg Oral Daily  . traZODone  50 mg Oral QHS  . DISCONTD:  HYDROmorphone (DILAUDID)  injection  1 mg Intravenous Once  . DISCONTD: HYDROmorphone  1 mg Oral Once  . DISCONTD: levocetirizine  5 mg Oral QPM  . DISCONTD: potassium chloride  10 mEq Intravenous Q1 Hr x 2   Continuous Infusions:   . 0.9 % NaCl with KCl 40 mEq / L 100 mL/hr at 10/28/11 0249

## 2011-10-29 DIAGNOSIS — R5381 Other malaise: Secondary | ICD-10-CM

## 2011-10-29 DIAGNOSIS — R21 Rash and other nonspecific skin eruption: Secondary | ICD-10-CM

## 2011-10-29 DIAGNOSIS — R112 Nausea with vomiting, unspecified: Secondary | ICD-10-CM

## 2011-10-29 LAB — CBC
MCH: 31.8 pg (ref 26.0–34.0)
Platelets: 389 10*3/uL (ref 150–400)
RBC: 2.86 MIL/uL — ABNORMAL LOW (ref 3.87–5.11)
RDW: 13.1 % (ref 11.5–15.5)
WBC: 11.3 10*3/uL — ABNORMAL HIGH (ref 4.0–10.5)

## 2011-10-29 LAB — ANCA SCREEN W REFLEX TITER
c-ANCA Screen: NEGATIVE
p-ANCA Screen: NEGATIVE

## 2011-10-29 LAB — BASIC METABOLIC PANEL
Calcium: 8.3 mg/dL — ABNORMAL LOW (ref 8.4–10.5)
Creatinine, Ser: 1.08 mg/dL (ref 0.50–1.10)
GFR calc non Af Amer: 64 mL/min — ABNORMAL LOW (ref >90)
Glucose, Bld: 108 mg/dL — ABNORMAL HIGH (ref 70–99)
Sodium: 136 mEq/L (ref 135–145)

## 2011-10-29 LAB — URINE DRUGS OF ABUSE SCREEN W ALC, ROUTINE (REF LAB)
Benzodiazepines.: POSITIVE — AB
Marijuana Metabolite: NEGATIVE
Opiate Screen, Urine: NEGATIVE
Propoxyphene: NEGATIVE

## 2011-10-29 LAB — ANA: Anti Nuclear Antibody(ANA): NEGATIVE

## 2011-10-29 MED ORDER — LEVOFLOXACIN 500 MG PO TABS: 500.0000 mg | ORAL_TABLET | Freq: Every day | ORAL | Status: AC

## 2011-10-29 MED ORDER — LEVOFLOXACIN 500 MG PO TABS
500.0000 mg | ORAL_TABLET | Freq: Every day | ORAL | Status: DC
  Administered 2011-10-29: 500 mg via ORAL
  Filled 2011-10-29: qty 1

## 2011-10-29 MED ORDER — FERROUS SULFATE 325 (65 FE) MG PO TABS: 325.0000 mg | ORAL_TABLET | Freq: Three times a day (TID) | ORAL | Status: DC

## 2011-10-29 MED ORDER — DOCUSATE SODIUM 100 MG PO CAPS: 100.0000 mg | ORAL_CAPSULE | Freq: Two times a day (BID) | ORAL | Status: DC | PRN

## 2011-10-29 MED ORDER — FLUVOXAMINE MALEATE 50 MG PO TABS: 50.0000 mg | ORAL_TABLET | Freq: Every day | ORAL | Status: DC

## 2011-10-29 NOTE — Progress Notes (Signed)
Patient discharge teaching given, including activity, diet, follow-up appoints, and medications. Patient verbalized understanding of all discharge instructions. IV access was d/c'd. Vitals are stable. Skin is intact except as charted in most recent assessments. Pt to be escorted out by NT, to be driven home by family. 

## 2011-10-29 NOTE — Care Management Note (Signed)
    Page 1 of 1   10/29/2011     10:57:49 AM   CARE MANAGEMENT NOTE 10/29/2011  Patient:  Brandi Santiago, Brandi Santiago   Account Number:  [id number]  Date Initiated:  10/28/2011  Documentation initiated by:  Letha Cape  Subjective/Objective Assessment:   dx pyelonephritis  admit- patient lives alone.  pta independent.     Action/Plan:   Anticipated DC Date:  10/29/2011   Anticipated DC Plan:  HOME/SELF CARE      DC Planning Services  CM consult      Choice offered to / List presented to:             Status of service:  Completed, signed off Medicare Important Message given?   (If response is "NO", the following Medicare IM given date fields will be blank) Date Medicare IM given:   Date Additional Medicare IM given:    Discharge Disposition:  HOME/SELF CARE  Per UR Regulation:  Reviewed for med. necessity/level of care/duration of stay  If discussed at Long Length of Stay Meetings, dates discussed:    Comments:  PCP Dr. Everlene Other  10/29/11 10:56 Letha Cape RN,BSN [phone] patient for dc today, no needs identified.  10/28/11 17:30 Letha Cape RN, BSN [phone] patient lives alone, pta independent.   Patient has medication coverage and transportation.  NCM will continue to follow for dc needs.

## 2011-10-29 NOTE — Discharge Summary (Signed)
Physician Discharge Summary  Brandi Santiago ZOX:[id number] DOB: [birth year] DOA: 10/27/2011  PCP: Aura Dials, MD  Admit date: 10/27/2011 Discharge date: 10/29/2011  Recommendations for Outpatient Follow-up:  1. Follow up with PCP and recommend a repeat CT SCAN OF THE ABDOMEN in 2 to 4 weeks and if lymphadenopathy hasnt resolved, you will need further work up for lymphoma by an oncologist. Discussed with the patient.  Discharge Diagnoses:  Principal Problem:  *Pyelonephritis Active Problems:  Anemia  IBS (irritable bowel syndrome)  Abnormal LFTs  Abdominal pain  Obsessive compulsive disorder  abdominal  lymphadenopathy  Discharge Condition: stable  Diet recommendation: regular  History of present illness:   41 year-old female who had come to [facility] 2 nights ago with left flank pain and at that time had CT abdomen pelvis which showed left-sided pyelonephritis and was discharged home on Bactrim. Since the pain persisted she had called her PCP who had advised her to go back to the ER.  Hospital Course:  1. Pyelonephritis - patient's urine culture done 2 days ago shows Escherichia coli growth sensitive to ceftriaxone which will be ordered. Blood cultures also has been ordered.  #2. Abdominal pain - this is more left flank and corresponds to the pyelonephritis. Patient's LFTs mildly elevated and also has periportal edema. I did discuss with radiologist at this time CAT scan does not show any portal vein thrombosis. The LFT'S are improving. Outpatient follow up.  #3. Lymphadenopathy in the CAT scan - this could be reactive to the pyelonephritis. But radiologist has also reported concerning for possible lymphoma. A repeat CAT scan to be done after resolution of pyelonephritis as outpatient. LDH is slightly high. Will get oncology input in am.  #4. Skin lesions - from picking? As per the patient, she feels bugs crawling over her body and picks at her wounds at times of stress and  she reports she is under a lot of stress lately. She also reports she is very depressed and sees a Veterinary surgeon as outpatient. questionable concern for vasculitis,? Patient's sedimentation rate is high. Patient also has oral ulcers and anemia. At this time we will get ANA, rheumatoid factor, ANCA titers. Patient may need skin biopsy. Patient does follow with a dermatologist Dr. Terri Piedra. The contact number is [phone].  #5. Anemia with known history of iron deficiency - patient follows up about GI and has had a colonoscopy last year and EGD previously. Closely follow CBC.  #6. Hypokalemia - replaced #& Depression: no suicidal ideation, says her anti depressants are not working good. Psychiatry consult called and she was started on new antidepressants.   Procedures:  Ct abdomen and pelvis  Consultations:  psychiatry  Discharge Exam: Filed Vitals:   10/29/11 1400  BP: 128/85  Pulse: 121  Temp: 97.9 F (36.6 C)  Resp: 22   Filed Vitals:   10/28/11 1400 10/28/11 2102 10/29/11 0628 10/29/11 1400  BP: 110/77 111/78 108/66 128/85  Pulse: 117 75 139 121  Temp: 98.5 F (36.9 C) 98.1 F (36.7 C)  97.9 F (36.6 C)  TempSrc: Oral Oral  Oral  Resp:  20 20 22   Height:      Weight:      SpO2: 96% 94%  100%   General: ALERT AFEBRILE COMFORTABLE  Cardiovascular: S!S2 heard RRR, no MRG  Respiratory: CTAB, no wheezing Or rhonchi  Abdomen: soft notTENDER ND BS+, no CVA TENDERNESS   Extremities; no pedal edema.  Skin and scalp: multiple scabs with blood from picking .  Discharge Instructions  Discharge Orders    Future Orders Please Complete By Expires   Diet general      Discharge instructions      Comments:   Follow up with Rheumatology in one week, to go over the abnormal blood tests and to evaluate for LUPUS.  Follow up with PCP in one week  To evaluate the sinus tachycardia and check  Cbc to see if leukocytosis has resolved. Get Repeat CT SCAN of the abdomen and pelvis in 4 weeks  to see if reactive lymphadenopathy has resolved, if it hasnt resolved she will need to be evaluated for lymphoma.   Activity as tolerated - No restrictions        Medication List  As of 10/29/2011  5:14 PM   STOP taking these medications         clidinium-chlordiazePOXIDE 2.5-5 MG per capsule      PARoxetine 10 MG tablet      sulfamethoxazole-trimethoprim 800-160 MG per tablet         TAKE these medications         amphetamine-dextroamphetamine 30 MG 24 hr capsule   Commonly known as: ADDERALL XR   Take 30 mg by mouth every morning.      ARIPiprazole 5 MG tablet   Commonly known as: ABILIFY   Take 2.5 mg by mouth daily.      docusate sodium 100 MG capsule   Commonly known as: COLACE   Take 1 capsule (100 mg total) by mouth 2 (two) times daily as needed for constipation.      esomeprazole 40 MG capsule   Commonly known as: NEXIUM   Take 40 mg by mouth daily before breakfast.      ferrous sulfate 325 (65 FE) MG tablet   Take 1 tablet (325 mg total) by mouth 3 (three) times daily with meals.      fluticasone 50 MCG/ACT nasal spray   Commonly known as: FLONASE   Place 2 sprays into the nose daily.      fluvoxaMINE 50 MG tablet   Commonly known as: LUVOX   Take 1 tablet (50 mg total) by mouth at bedtime.      IMITREX 6 MG/0.5ML Soln injection   Generic drug: SUMAtriptan   Inject 6 mg into the skin.      levocetirizine 5 MG tablet   Commonly known as: XYZAL   Take 5 mg by mouth every evening.      levofloxacin 500 MG tablet   Commonly known as: LEVAQUIN   Take 1 tablet (500 mg total) by mouth daily.      ondansetron 8 MG disintegrating tablet   Commonly known as: ZOFRAN-ODT   Take 1 tab on the tongue twice daily as needed for nausea      oxyCODONE-acetaminophen 10-325 MG per tablet   Commonly known as: PERCOCET   Take 1 tablet by mouth every 4 (four) hours as needed for pain.      topiramate 25 MG capsule   Commonly known as: TOPAMAX   Take 100 mg by mouth  daily.      traZODone 50 MG tablet   Commonly known as: DESYREL   Take 50 mg by mouth at bedtime.      VALIUM 10 MG tablet   Generic drug: diazepam   Take 10 mg by mouth every 6 (six) hours as needed. For anxiety              The results of  significant diagnostics from this hospitalization (including imaging, microbiology, ancillary and laboratory) are listed below for reference.    Significant Diagnostic Studies: Ct Abdomen Pelvis W Contrast  10/26/2011  *RADIOLOGY REPORT*  Clinical Data: Left-sided abdominal pain for 3 months.  Nausea. White cell count 25.  Microhematuria.  History of appendectomy, partial hysterectomy, ulcerative proctitis, Barrett's esophagus, hiatal hernia, and multiple sclerosis.  CT ABDOMEN AND PELVIS WITH CONTRAST  Technique:  Multidetector CT imaging of the abdomen and pelvis was performed following the standard protocol during bolus administration of intravenous contrast.  Contrast: OMNIPAQUE IOHEXOL 300 MG/ML  SOLN  Comparison: 07/13/2005  Findings: Dependent atelectasis in the lung bases.  There is mild periportal edema which can be a normal variant or reflect inflammatory change in the liver.  The liver, spleen, gallbladder, pancreas, adrenal glands, and abdominal aorta are unremarkable.  There is a small accessory spleen.  Para-aortic retroperitoneal lymph nodes are moderately prominent as are the celiac axis lymph nodes.  There is no definite pathologic lymphadenopathy and changes may be reactive. The stomach, small bowel, and colon are not significantly distended.  The colon is diffusely stool filled.  No free air or free fluid in the abdomen.  The left kidney is enlarged in comparison to the right and demonstrates a heterogeneous striated nephrogram with mild pararenal stranding most consistent with pyelonephritis.  Lymphoma involvement is not excluded and follow-up examination after resolution of acute symptoms is recommended.  Pelvis:  The uterus and  adnexal structures are not pathologically enlarged.  The bladder wall is not thickened.  No free or loculated pelvic fluid collection.  The appendix is surgically absent by history.  No evidence of diverticulitis.  Normal alignment of the lumbar vertebrae.  IMPRESSION: Enlarged left kidney with heterogeneous nephrogram most consistent with pyelonephritis.  Lymphoma involvement is not excluded and follow-up examination after resolution of acute symptoms is recommended.  Prominence of celiac axis and retroperitoneal lymph nodes without pathologic lymphadenopathy, likely reactive. Periportal edema which is likely normal variation although inflammatory process could have this appearance.  Original Report Authenticated By: Marlon Pel, M.D.    Microbiology: Recent Results (from the past 240 hour(s))  URINE CULTURE     Status: Normal   Collection Time   10/25/11 11:50 PM      Component Value Range Status Comment   Specimen Description URINE, CLEAN CATCH   Final    Special Requests NONE   Final    Culture  Setup Time [id number]   Final    Colony Count >=100,000 COLONIES/ML   Final    Culture ESCHERICHIA COLI   Final    Report Status 10/27/2011 FINAL   Final    Organism ID, Bacteria ESCHERICHIA COLI   Final   URINE CULTURE     Status: Normal   Collection Time   10/27/11  8:08 PM      Component Value Range Status Comment   Specimen Description URINE, RANDOM   Final    Special Requests Normal   Final    Culture  Setup Time [id number]   Final    Colony Count NO GROWTH   Final    Culture NO GROWTH   Final    Report Status 10/28/2011 FINAL   Final   CULTURE, BLOOD (ROUTINE X 2)     Status: Normal (Preliminary result)   Collection Time   10/27/11 11:35 PM      Component Value Range Status Comment   Specimen Description BLOOD RIGHT HAND  Final    Special Requests BOTTLES DRAWN AEROBIC ONLY 5CC   Final    Culture  Setup Time [id number]   Final    Culture     Final    Value:        BLOOD  CULTURE RECEIVED NO GROWTH TO DATE CULTURE WILL BE HELD FOR 5 DAYS BEFORE ISSUING A FINAL NEGATIVE REPORT   Report Status PENDING   Incomplete   CULTURE, BLOOD (ROUTINE X 2)     Status: Normal (Preliminary result)   Collection Time   10/27/11 11:45 PM      Component Value Range Status Comment   Specimen Description BLOOD RIGHT ARM   Final    Special Requests BOTTLES DRAWN AEROBIC AND ANAEROBIC 10CC EACH   Final    Culture  Setup Time [id number]   Final    Culture     Final    Value:        BLOOD CULTURE RECEIVED NO GROWTH TO DATE CULTURE WILL BE HELD FOR 5 DAYS BEFORE ISSUING A FINAL NEGATIVE REPORT   Report Status PENDING   Incomplete      Labs: Basic Metabolic Panel:  Lab 10/29/11 4696 10/28/11 1659 10/28/11 0615 10/27/11 1959 10/26/11 0003 10/25/11 2355  NA 136 -- 133* 135 139 135  K 3.7 3.6 2.7* 2.9* 3.2* --  CL 103 -- 98 98 101 98  CO2 20 -- 21 23 -- 25  GLUCOSE 108* -- 109* 77 106* 104*  BUN 7 -- 12 19 19 19   CREATININE 1.08 -- 1.18* 1.25* 1.50* 1.22*  CALCIUM 8.3* -- 7.6* 8.8 -- 8.8  MG -- -- -- -- -- --  PHOS -- -- -- -- -- --   Liver Function Tests:  Lab 10/28/11 0615 10/27/11 1959 10/25/11 2355  AST 38* 74* 87*  ALT 54* 76* 80*  ALKPHOS 150* 157* 123*  BILITOT 0.1* 0.2* 0.2*  PROT 5.2* 6.4 6.5  ALBUMIN 1.7* 2.2* 2.4*   No results found for this basename: LIPASE:5,AMYLASE:5 in the last 168 hours No results found for this basename: AMMONIA:5 in the last 168 hours CBC:  Lab 10/29/11 0645 10/28/11 0615 10/27/11 1959 10/26/11 0003 10/25/11 2350  WBC 11.3* 16.2* 19.5* -- 25.0*  NEUTROABS -- 14.1* 17.5* -- 22.4*  HGB 9.1* 8.5* 10.4* 10.9* 10.1*  HCT 26.5* 24.4* 30.7* 32.0* 29.7*  MCV 92.7 93.1 94.2 -- 94.6  PLT 389 331 378 -- 316   Cardiac Enzymes: No results found for this basename: CKTOTAL:5,CKMB:5,CKMBINDEX:5,TROPONINI:5 in the last 168 hours BNP: BNP (last 3 results) No results found for this basename: PROBNP:3 in the last 8760 hours CBG: No results  found for this basename: GLUCAP:5 in the last 168 hours  Time coordinating discharge: 62 minutes  Signed:  Kathlen Mody, MD  [facility] 10/29/2011, 5:14 PM

## 2011-11-01 ENCOUNTER — Emergency Department (HOSPITAL_COMMUNITY)
Admission: EM | Admit: 2011-11-01 | Discharge: 2011-11-01 | Disposition: A | Discharge status: Home / Self Care | Payer: BC Managed Care – PPO | Attending: Emergency Medicine | Admitting: Emergency Medicine

## 2011-11-01 ENCOUNTER — Encounter (HOSPITAL_COMMUNITY): Payer: Self-pay | Admitting: Emergency Medicine

## 2011-11-01 DIAGNOSIS — F3289 Other specified depressive episodes: Secondary | ICD-10-CM | POA: Insufficient documentation

## 2011-11-01 DIAGNOSIS — F419 Anxiety disorder, unspecified: Secondary | ICD-10-CM

## 2011-11-01 DIAGNOSIS — F329 Major depressive disorder, single episode, unspecified: Secondary | ICD-10-CM | POA: Insufficient documentation

## 2011-11-01 DIAGNOSIS — F411 Generalized anxiety disorder: Secondary | ICD-10-CM | POA: Insufficient documentation

## 2011-11-01 DIAGNOSIS — N12 Tubulo-interstitial nephritis, not specified as acute or chronic: Secondary | ICD-10-CM | POA: Insufficient documentation

## 2011-11-01 DIAGNOSIS — R109 Unspecified abdominal pain: Secondary | ICD-10-CM | POA: Insufficient documentation

## 2011-11-01 LAB — CBC WITH DIFFERENTIAL/PLATELET
Basophils Absolute: 0 10*3/uL (ref 0.0–0.1)
Basophils Relative: 0 % (ref 0–1)
HCT: 31.1 % — ABNORMAL LOW (ref 36.0–46.0)
Lymphocytes Relative: 10 % — ABNORMAL LOW (ref 12–46)
Monocytes Absolute: 0.4 10*3/uL (ref 0.1–1.0)
Neutro Abs: 8.5 10*3/uL — ABNORMAL HIGH (ref 1.7–7.7)
Neutrophils Relative %: 85 % — ABNORMAL HIGH (ref 43–77)
RDW: 13.3 % (ref 11.5–15.5)
WBC: 9.9 10*3/uL (ref 4.0–10.5)

## 2011-11-01 LAB — URINALYSIS, ROUTINE W REFLEX MICROSCOPIC
Glucose, UA: NEGATIVE mg/dL
Ketones, ur: NEGATIVE mg/dL
Protein, ur: 30 mg/dL — AB
Urobilinogen, UA: 0.2 mg/dL (ref 0.0–1.0)

## 2011-11-01 LAB — COMPREHENSIVE METABOLIC PANEL
ALT: 26 U/L (ref 0–35)
AST: 21 U/L (ref 0–37)
Albumin: 2.8 g/dL — ABNORMAL LOW (ref 3.5–5.2)
Alkaline Phosphatase: 102 U/L (ref 39–117)
CO2: 22 mEq/L (ref 19–32)
Chloride: 95 mEq/L — ABNORMAL LOW (ref 96–112)
Creatinine, Ser: 1.17 mg/dL — ABNORMAL HIGH (ref 0.50–1.10)
GFR calc non Af Amer: 58 mL/min — ABNORMAL LOW (ref >90)
Potassium: 3.8 mEq/L (ref 3.5–5.1)
Sodium: 133 mEq/L — ABNORMAL LOW (ref 135–145)
Total Bilirubin: 0.3 mg/dL (ref 0.3–1.2)

## 2011-11-01 LAB — RAPID URINE DRUG SCREEN, HOSP PERFORMED
Amphetamines: POSITIVE — AB
Barbiturates: NOT DETECTED
Benzodiazepines: POSITIVE — AB
Tetrahydrocannabinol: NOT DETECTED

## 2011-11-01 LAB — URINE MICROSCOPIC-ADD ON

## 2011-11-01 MED ORDER — LIDOCAINE HCL (PF) 1 % IJ SOLN
2.0000 mL | Freq: Once | INTRAMUSCULAR | Status: AC
  Administered 2011-11-01: 2 mL

## 2011-11-01 MED ORDER — FLUCONAZOLE 150 MG PO TABS: 150.0000 mg | ORAL_TABLET | Freq: Every day | ORAL | Status: AC

## 2011-11-01 MED ORDER — LIDOCAINE HCL (PF) 1 % IJ SOLN
INTRAMUSCULAR | Status: AC
  Administered 2011-11-01: 2 mL
  Filled 2011-11-01: qty 5

## 2011-11-01 MED ORDER — LORAZEPAM 1 MG PO TABS
1.0000 mg | ORAL_TABLET | Freq: Once | ORAL | Status: AC
  Administered 2011-11-01: 1 mg via ORAL
  Filled 2011-11-01: qty 1

## 2011-11-01 MED ORDER — CEPHALEXIN 500 MG PO CAPS: 500.0000 mg | ORAL_CAPSULE | Freq: Four times a day (QID) | ORAL | Status: AC

## 2011-11-01 MED ORDER — OXYCODONE-ACETAMINOPHEN 5-325 MG PO TABS: 1.0000 | ORAL_TABLET | ORAL | Status: AC | PRN

## 2011-11-01 MED ORDER — CEFTRIAXONE SODIUM 1 G IJ SOLR
1.0000 g | Freq: Once | INTRAMUSCULAR | Status: AC
  Administered 2011-11-01: 1 g via INTRAMUSCULAR
  Filled 2011-11-01: qty 10

## 2011-11-01 MED ORDER — OXYCODONE-ACETAMINOPHEN 5-325 MG PO TABS
1.0000 | ORAL_TABLET | Freq: Once | ORAL | Status: AC
  Administered 2011-11-01: 1 via ORAL
  Filled 2011-11-01: qty 1

## 2011-11-01 MED ORDER — LORAZEPAM 1 MG PO TABS: 1.0000 mg | ORAL_TABLET | Freq: Three times a day (TID) | ORAL | Status: AC | PRN

## 2011-11-01 MED ORDER — DEXTROSE 5 % IV SOLN: 1.0000 g | Freq: Once | INTRAVENOUS | Status: DC

## 2011-11-01 NOTE — ED Notes (Signed)
Pt for discharge.GSC 15,RR 16.Discharged home with father.

## 2011-11-01 NOTE — Discharge Instructions (Signed)
Your urine still shows some signs of infection. Continue bactrim. Add keflex, take until all gone. Take percocet for pain. Ativan for anxiety and to help with alchol withdraws. Make sure to follow up with scheduled with your therapist, your rheumatologist, and your primary care doctor. Return if fever, nausea, vomiting, worsening pain, or any new concerning symptom.   Pyelonephritis, Adult Pyelonephritis is a kidney infection. In general, there are 2 main types of pyelonephritis:  Infections that come on quickly without any warning (acute pyelonephritis).   Infections that persist for a long period of time (chronic pyelonephritis).  CAUSES  Two main causes of pyelonephritis are:  Bacteria traveling from the bladder to the kidney. This is a problem especially in pregnant women. The urine in the bladder can become filled with bacteria from multiple causes, including:   Inflammation of the prostate gland (prostatitis).   Sexual intercourse in females.   Bladder infection (cystitis).   Bacteria traveling from the bloodstream to the tissue part of the kidney.  Problems that may increase your risk of getting a kidney infection include:  Diabetes.   Kidney stones or bladder stones.   Cancer.   Catheters placed in the bladder.   Other abnormalities of the kidney or ureter.  SYMPTOMS   Abdominal pain.   Pain in the side or flank area.   Fever.   Chills.   Upset stomach.   Blood in the urine (dark urine).   Frequent urination.   Strong or persistent urge to urinate.   Burning or stinging when urinating.  DIAGNOSIS  Your caregiver may diagnose your kidney infection based on your symptoms. A urine sample may also be taken. TREATMENT  In general, treatment depends on how severe the infection is.   If the infection is mild and caught early, your caregiver may treat you with oral antibiotics and send you home.   If the infection is more severe, the bacteria may have gotten  into the bloodstream. This will require intravenous (IV) antibiotics and a hospital stay. Symptoms may include:   High fever.   Severe flank pain.   Shaking chills.   Even after a hospital stay, your caregiver may require you to be on oral antibiotics for a period of time.   Other treatments may be required depending upon the cause of the infection.  HOME CARE INSTRUCTIONS   Take your antibiotics as directed. Finish them even if you start to feel better.   Make an appointment to have your urine checked to make sure the infection is gone.   Drink enough fluids to keep your urine clear or pale yellow.   Take medicines for the bladder if you have urgency and frequency of urination as directed by your caregiver.  SEEK IMMEDIATE MEDICAL CARE IF:   You have a fever.   You are unable to take your antibiotics or fluids.   You develop shaking chills.   You experience extreme weakness or fainting.   There is no improvement after 2 days of treatment.  MAKE SURE YOU:  Understand these instructions.   Will watch your condition.   Will get help right away if you are not doing well or get worse.  Document Released: 04/20/2005 Document Revised: 04/09/2011 Document Reviewed: 09/24/2010 [facility] Patient Information 2012 [place], [place].

## 2011-11-01 NOTE — ED Provider Notes (Signed)
History     CSN: [id number]  Arrival date & time 11/01/11  1510   First MD Initiated Contact with Patient 11/01/11 1643      Chief Complaint  Patient presents with  . Alcohol Problem  . Urinary Tract Infection  . Stress    (Consider location/radiation/quality/duration/timing/severity/associated sxs/prior treatment) Patient is a 41 y.o. female presenting with abdominal pain. The history is provided by the patient.  Abdominal Pain The primary symptoms of the illness include abdominal pain. The primary symptoms of the illness do not include fever, nausea, vomiting, dysuria, vaginal discharge or vaginal bleeding. The current episode started more than 2 days ago. The onset of the illness was gradual. The problem has not changed since onset. Symptoms associated with the illness do not include chills or back pain.  pt states she was discharged from the hospital 3 days ago. States she was discharged with no pain medications. States she continues to have pain in th right flank where she was diagnosed with pyelonephritis. States she is not having fever or pain with urination. Pain has not changed, it is just not improving.   Pt is also reporting that she is having problems with anxiety. She has ran out of her ativan, has been switching her medications. States she stopped prozac a week ago. Also not taking abilify  "for some time now." States has had alcohol problem, but stopped that as well a week ago. States " I want to detox and get off my medicatoins."    Past Medical History  Diagnosis Date  . Irritable bowel syndrome   . Other and unspecified noninfectious gastroenteritis and colitis   . Depressive disorder, not elsewhere classified   . Anxiety state, unspecified   . Nausea alone   . Esophageal reflux   . Ulcerative (chronic) proctitis   . MS (multiple sclerosis)   . Barrett esophagus   . Allergy   . Anemia   . Depressive disorder, not elsewhere classified   . Hiatal hernia   .  Iron deficiency anemia, unspecified     Past Surgical History  Procedure Date  . Nasal sinus surgery   . Mandible fracture surgery   . Appendectomy   . Cesarean section   . Partial hysterectomy     Family History  Problem Relation Age of Onset  . Colon cancer Neg Hx   . Diabetes Paternal Grandmother   . Diabetes Paternal Grandfather   . Clotting disorder Father     History  Substance Use Topics  . Smoking status: Never Smoker   . Smokeless tobacco: Never Used  . Alcohol Use: Yes     rare    OB History    Grav Para Term Preterm Abortions TAB SAB Ect Mult Living                  Review of Systems  Constitutional: Negative for fever and chills.  HENT: Negative for neck pain.   Respiratory: Negative.   Cardiovascular: Negative.   Gastrointestinal: Positive for abdominal pain. Negative for nausea and vomiting.  Genitourinary: Positive for flank pain. Negative for dysuria, vaginal bleeding, vaginal discharge, vaginal pain and pelvic pain.  Musculoskeletal: Negative for back pain.  Skin: Positive for wound.  Neurological: Negative for dizziness, weakness and numbness.  Psychiatric/Behavioral: The patient is nervous/anxious.     Allergies  Review of patient's allergies indicates no known allergies.  Home Medications   Current Outpatient Rx  Name Route Sig Dispense Refill  . AMPHETAMINE-DEXTROAMPHET ER  30 MG PO CP24 Oral Take 30 mg by mouth every morning.    Marland Kitchen DIAZEPAM 10 MG PO TABS Oral Take 10 mg by mouth every 6 (six) hours as needed. For anxiety    . ESOMEPRAZOLE MAGNESIUM 40 MG PO CPDR Oral Take 40 mg by mouth 2 (two) times daily.     Marland Kitchen FERROUS SULFATE 325 (65 FE) MG PO TABS Oral Take 1 tablet (325 mg total) by mouth 3 (three) times daily with meals. 90 tablet 0  . FLUTICASONE PROPIONATE 50 MCG/ACT NA SUSP Nasal Place 2 sprays into the nose daily.    Marland Kitchen FLUVOXAMINE MALEATE 50 MG PO TABS Oral Take 50 mg by mouth daily.    Marland Kitchen LEVOFLOXACIN 500 MG PO TABS Oral Take 1  tablet (500 mg total) by mouth daily. 7 tablet 0  . ONDANSETRON 8 MG PO TBDP  Take 1 tab on the tongue twice daily as needed for nausea 60 tablet 2  . OXYCODONE-ACETAMINOPHEN 10-325 MG PO TABS Oral Take 1 tablet by mouth every 4 (four) hours as needed for pain. 15 tablet 0  . TRAZODONE HCL 50 MG PO TABS Oral Take 50 mg by mouth at bedtime.      BP 104/84  Pulse 99  Temp 98.1 F (36.7 C) (Oral)  Resp 20  SpO2 100%  Physical Exam  Nursing note and vitals reviewed. Constitutional: She is oriented to person, place, and time. She appears well-developed and well-nourished.       shaky  HENT:  Head: Normocephalic and atraumatic.       Multiple sores all over the face, with scabs, also on lips, and oral mucosa  Eyes: Conjunctivae are normal.  Neck: Neck supple.  Cardiovascular: Normal rate, regular rhythm and normal heart sounds.   Pulmonary/Chest: Effort normal and breath sounds normal. No respiratory distress. She has no wheezes. She has no rales.  Abdominal: Soft. Bowel sounds are normal. She exhibits no distension. There is tenderness.       Left upper quadrant tenderness, left CVA tenderness  Musculoskeletal: She exhibits no edema.  Neurological: She is alert and oriented to person, place, and time.  Skin: Skin is warm and dry.       Multiple lesions all over the body, scabs    ED Course  Procedures (including critical care time)  Pt is very anxious but appears non toxic. VS normal.  Filed Vitals:   11/01/11 1752  BP: 104/84  Pulse: 99  Temp:   Resp: 20   She is afebrile, no vomiting in ED. Left falnk pain. Recent pyelonephritis by CT. Cultures with no growth. Will recheck UA. I ordere pt  Pain medications and anti anxiety medications. Labs pending.   Results for orders placed during the hospital encounter of 11/01/11  CBC WITH DIFFERENTIAL      Component Value Range   WBC 9.9  4.0 - 10.5 K/uL   RBC 3.25 (*) 3.87 - 5.11 MIL/uL   Hemoglobin 10.3 (*) 12.0 - 15.0 g/dL    HCT 47.8 (*) 29.5 - 46.0 %   MCV 95.7  78.0 - 100.0 fL   MCH 31.7  26.0 - 34.0 pg   MCHC 33.1  30.0 - 36.0 g/dL   RDW 62.1  30.8 - 65.7 %   Platelets 737 (*) 150 - 400 K/uL   Neutrophils Relative 85 (*) 43 - 77 %   Neutro Abs 8.5 (*) 1.7 - 7.7 K/uL   Lymphocytes Relative 10 (*) 12 - 46 %  Lymphs Abs 1.0  0.7 - 4.0 K/uL   Monocytes Relative 4  3 - 12 %   Monocytes Absolute 0.4  0.1 - 1.0 K/uL   Eosinophils Relative 0  0 - 5 %   Eosinophils Absolute 0.0  0.0 - 0.7 K/uL   Basophils Relative 0  0 - 1 %   Basophils Absolute 0.0  0.0 - 0.1 K/uL  COMPREHENSIVE METABOLIC PANEL      Component Value Range   Sodium 133 (*) 135 - 145 mEq/L   Potassium 3.8  3.5 - 5.1 mEq/L   Chloride 95 (*) 96 - 112 mEq/L   CO2 22  19 - 32 mEq/L   Glucose, Bld 89  70 - 99 mg/dL   BUN 11  6 - 23 mg/dL   Creatinine, Ser 4.54 (*) 0.50 - 1.10 mg/dL   Calcium 9.3  8.4 - 09.8 mg/dL   Total Protein 7.8  6.0 - 8.3 g/dL   Albumin 2.8 (*) 3.5 - 5.2 g/dL   AST 21  0 - 37 U/L   ALT 26  0 - 35 U/L   Alkaline Phosphatase 102  39 - 117 U/L   Total Bilirubin 0.3  0.3 - 1.2 mg/dL   GFR calc non Af Amer 58 (*) >90 mL/min   GFR calc Af Amer 68 (*) >90 mL/min  LIPASE, BLOOD      Component Value Range   Lipase 23  11 - 59 U/L  ETHANOL      Component Value Range   Alcohol, Ethyl (B) <11  0 - 11 mg/dL  URINE RAPID DRUG SCREEN (HOSP PERFORMED)      Component Value Range   Opiates NONE DETECTED  NONE DETECTED   Cocaine NONE DETECTED  NONE DETECTED   Benzodiazepines POSITIVE (*) NONE DETECTED   Amphetamines POSITIVE (*) NONE DETECTED   Tetrahydrocannabinol NONE DETECTED  NONE DETECTED   Barbiturates NONE DETECTED  NONE DETECTED  URINALYSIS, ROUTINE W REFLEX MICROSCOPIC      Component Value Range   Color, Urine YELLOW  YELLOW   APPearance CLOUDY (*) CLEAR   Specific Gravity, Urine 1.029  1.005 - 1.030   pH 5.5  5.0 - 8.0   Glucose, UA NEGATIVE  NEGATIVE mg/dL   Hgb urine dipstick SMALL (*) NEGATIVE   Bilirubin Urine  SMALL (*) NEGATIVE   Ketones, ur NEGATIVE  NEGATIVE mg/dL   Protein, ur 30 (*) NEGATIVE mg/dL   Urobilinogen, UA 0.2  0.0 - 1.0 mg/dL   Nitrite NEGATIVE  NEGATIVE   Leukocytes, UA SMALL (*) NEGATIVE  URINE MICROSCOPIC-ADD ON      Component Value Range   WBC, UA 21-50  <3 WBC/hpf   RBC / HPF 0-2  <3 RBC/hpf   Bacteria, UA RARE  RARE   Casts HYALINE CASTS (*) NEGATIVE   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 She would not qualify for inpatient detox. sheis already seeing a therapist. Instructed to follow up with them, will give outpatient detox resources. Urine still has WBCs in it. Pt non toxic, afebrile, no n/v. Currently on bactrim. Will add keflex, rocephin 1g given in ED. Will d/c her home with close follow up.     1. Pyelonephritis   2. Anxiety   3. Flank pain       MDM          Lottie Mussel, PA 11/02/11 [phone]

## 2011-11-01 NOTE — ED Notes (Signed)
Pt reports dealing with a lot of stress in her life recently. Pt diagnosed with kidney infection recently, continues to have abdominal pain radiating into back. Pt was released from Hospital on Thursday. Pt also c/o mouth sores. Pt reports her friend thinks she drinks too much. Pt denies being an alcoholic at present. Reports last drink was Sunday, denies feeling DT's at present. Pt shaky while sitting chair.

## 2011-11-02 NOTE — ED Provider Notes (Signed)
Medical screening examination/treatment/procedure(s) were performed by non-physician practitioner and as supervising physician I was immediately available for consultation/collaboration.  Jeiry Birnbaum K Linker, MD 11/02/11 1506 

## 2011-11-03 LAB — CULTURE, BLOOD (ROUTINE X 2)

## 2011-11-09 NOTE — ED Provider Notes (Signed)
Medical screening examination/treatment/procedure(s) were performed by non-physician practitioner and as supervising physician I was immediately available for consultation/collaboration.  Maize Brittingham, MD 11/09/11 0716 

## 2011-11-24 ENCOUNTER — Ambulatory Visit (INDEPENDENT_AMBULATORY_CARE_PROVIDER_SITE_OTHER): Payer: BC Managed Care – PPO | Admitting: Gastroenterology

## 2011-11-24 ENCOUNTER — Other Ambulatory Visit (INDEPENDENT_AMBULATORY_CARE_PROVIDER_SITE_OTHER): Payer: BC Managed Care – PPO

## 2011-11-24 ENCOUNTER — Encounter: Payer: Self-pay | Admitting: Gastroenterology

## 2011-11-24 VITALS — BP 115/80 | HR 104 | Ht 66.0 in | Wt 115.2 lb

## 2011-11-24 DIAGNOSIS — F419 Anxiety disorder, unspecified: Secondary | ICD-10-CM

## 2011-11-24 DIAGNOSIS — R110 Nausea: Secondary | ICD-10-CM

## 2011-11-24 DIAGNOSIS — K589 Irritable bowel syndrome without diarrhea: Secondary | ICD-10-CM

## 2011-11-24 DIAGNOSIS — F411 Generalized anxiety disorder: Secondary | ICD-10-CM

## 2011-11-24 DIAGNOSIS — R634 Abnormal weight loss: Secondary | ICD-10-CM

## 2011-11-24 DIAGNOSIS — K219 Gastro-esophageal reflux disease without esophagitis: Secondary | ICD-10-CM

## 2011-11-24 DIAGNOSIS — R599 Enlarged lymph nodes, unspecified: Secondary | ICD-10-CM

## 2011-11-24 DIAGNOSIS — R590 Localized enlarged lymph nodes: Secondary | ICD-10-CM

## 2011-11-24 DIAGNOSIS — R197 Diarrhea, unspecified: Secondary | ICD-10-CM

## 2011-11-24 DIAGNOSIS — F988 Other specified behavioral and emotional disorders with onset usually occurring in childhood and adolescence: Secondary | ICD-10-CM

## 2011-11-24 MED ORDER — AMBULATORY NON FORMULARY MEDICATION: Status: DC

## 2011-11-24 NOTE — Progress Notes (Signed)
This is a 41 year old Caucasian female with ADD and IBS. We have followed her for many years for her diarrhea predominant IBS with abdominal gas, bloating, and crampy abdominal pain. She recently had a UTI with pyelonephritis, and was admitted to [facility] for IV antibiotics. At that time her sedimentation rate was over 100, and she had a white count 19,000 which has subsequently decreased to normal. She has a chronic iron deficiency anemia which is stable, and she is greatly concerned about recent  CT scan that was obtained at the time of her hospitalization which showed some intra-abdominal lymphadenopathy. 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 Repeat sed rate was 78, but liver function test were normal.  Patient continues to complain of pain in the left side of her abdomen and chest with diarrhea, and has rather marked anxiety related to her recent problems. There is no melena, hematochezia, dysphagia, or hepatobiliary complaints. She denies abuse of alcohol, cigarettes, or NSAIDs.She has chronic acid reflux and is on regular Nexium 40 mg a day. Despite these complaints is been no anorexia, weight loss, fever, chills, or other systemic complaints. She does use when necessary Zofran, Valium, Paxil, Luvox, and Desyrel!!!!!  Current Medications, Allergies, Past Medical History, Past Surgical History, Family History and Social History were reviewed in [employer] record.  Pertinent Review of Systems Negative..she has photosensitivity and complains of arthritis in her hands and large joints. However, she's not had swollen joints or any associated skin rashes.   Physical Exam:blood pressure 115/80, pulse 104, weight 115 pounds with a BMI of 18.6. I cannot appreciate stigmata  of chronic liver disease or lymphadenopathy. Chest is clear I cannot appreciate significant murmurs gallops or rubs. Her abdomen shows no distention, organomegaly, masses or tenderness. Bowel sounds are normal. There is no peripheral edema, swollen joints, and mental status is normal. She does seem very agitated. The patient is on Adderall XR 30 mg a day.    Assessment and Plan:review of her previous CT scans for several years has shown some nonspecific intra-abdominal lymphadenopathy. I suspect a lot of her current problems are related to her recent pyelonephritis, her white count has returned to normal, and I suspect her sed rate will be decreasing. We will proceed with CT scan as recommended by her rheumatologist, repeat sedimentation rate, CRP, and standard stool exams. I've given her GI cocktail to take at bedtime or when necessary as needed. I will send this note to her rheumatologist and to Dr. Tracey Harries. Obviously, if her lymphadenopathy persists we will need to do fine needle biopsy of these lymph nodes.   Please copy her primary care physician, referring physician, and pertinent subspecialists.Dr.Bouska and Dr. Alfonso Ramus. [facility] orthopedic clinic [address] [place] [address].  Encounter Diagnoses  Name Primary?  . GERD (gastroesophageal reflux disease) Yes  . Nausea   . Diarrhea   . Weight loss     N

## 2011-11-24 NOTE — Patient Instructions (Addendum)
  You have been scheduled for a CT scan of the abdomen and pelvis at [facility] radiology department.  You are scheduled on 11/25/11 at 7:45 am. You should arrive 15 minutes prior to your appointment time for registration. Please follow the written instructions below on the day of your exam:  WARNING: IF YOU ARE ALLERGIC TO IODINE/X-RAY DYE, PLEASE NOTIFY RADIOLOGY IMMEDIATELY AT [id number]! YOU WILL BE GIVEN A 13 HOUR PREMEDICATION PREP.  1) Do not eat or drink anything after  4 am (4 hours prior to your test) 2) You have been given 2 bottles of oral contrast to drink. The solution may taste better if refrigerated, but do NOT add ice or any other liquid to this solution. Shake well before drinking.    Drink 1 bottle of contrast @  6 am (2 hours prior to your exam)  Drink 1 bottle of contrast @   7 am (1 hour prior to your exam)  You may take any medications as prescribed with a small amount of water except for the following: Metformin, Glucophage, Glucovance, Avandamet, Riomet, Fortamet, Actoplus Met, Janumet, Glumetza or Metaglip. The above medications must be held the day of the exam AND 48 hours after the exam.  The purpose of you drinking the oral contrast is to aid in the visualization of your intestinal tract. The contrast solution may cause some diarrhea. Before your exam is started, you will be given a small amount of fluid to drink. Depending on your individual set of symptoms, you may also receive an intravenous injection of x-ray contrast/dye.    We have given you a printed prescription for GI cocktail. Please head down to the lab after leaving here today.  CC: Tracey Harries MD __________________________________________________________

## 2011-11-25 ENCOUNTER — Encounter (HOSPITAL_COMMUNITY): Payer: Self-pay

## 2011-11-25 ENCOUNTER — Ambulatory Visit (HOSPITAL_COMMUNITY)
Admission: RE | Admit: 2011-11-25 | Discharge: 2011-11-25 | Disposition: A | Discharge status: Home / Self Care | Payer: BC Managed Care – PPO | Source: Ambulatory Visit | Attending: Gastroenterology | Admitting: Gastroenterology

## 2011-11-25 DIAGNOSIS — N83209 Unspecified ovarian cyst, unspecified side: Secondary | ICD-10-CM | POA: Insufficient documentation

## 2011-11-25 DIAGNOSIS — R197 Diarrhea, unspecified: Secondary | ICD-10-CM

## 2011-11-25 DIAGNOSIS — R110 Nausea: Secondary | ICD-10-CM

## 2011-11-25 DIAGNOSIS — R109 Unspecified abdominal pain: Secondary | ICD-10-CM | POA: Insufficient documentation

## 2011-11-25 DIAGNOSIS — K219 Gastro-esophageal reflux disease without esophagitis: Secondary | ICD-10-CM

## 2011-11-25 DIAGNOSIS — R634 Abnormal weight loss: Secondary | ICD-10-CM | POA: Insufficient documentation

## 2011-11-25 MED ORDER — IOHEXOL 300 MG/ML  SOLN
80.0000 mL | Freq: Once | INTRAMUSCULAR | Status: AC | PRN
  Administered 2011-11-25: 80 mL via INTRAVENOUS

## 2011-11-30 ENCOUNTER — Other Ambulatory Visit: Payer: Medicaid Other

## 2011-11-30 DIAGNOSIS — R634 Abnormal weight loss: Secondary | ICD-10-CM

## 2011-11-30 DIAGNOSIS — R197 Diarrhea, unspecified: Secondary | ICD-10-CM

## 2011-11-30 DIAGNOSIS — K219 Gastro-esophageal reflux disease without esophagitis: Secondary | ICD-10-CM

## 2011-11-30 DIAGNOSIS — R110 Nausea: Secondary | ICD-10-CM

## 2011-12-03 ENCOUNTER — Encounter: Payer: Self-pay | Admitting: Internal Medicine

## 2011-12-04 LAB — STOOL CULTURE

## 2012-01-08 ENCOUNTER — Other Ambulatory Visit: Payer: Self-pay | Admitting: Gastroenterology

## 2012-03-12 ENCOUNTER — Encounter (HOSPITAL_COMMUNITY): Payer: Self-pay

## 2012-03-12 ENCOUNTER — Emergency Department (HOSPITAL_COMMUNITY)
Admission: EM | Admit: 2012-03-12 | Discharge: 2012-03-12 | Disposition: A | Discharge status: Home / Self Care | Payer: Self-pay | Source: Home / Self Care | Attending: Family Medicine | Admitting: Family Medicine

## 2012-03-12 DIAGNOSIS — F419 Anxiety disorder, unspecified: Secondary | ICD-10-CM

## 2012-03-12 DIAGNOSIS — F411 Generalized anxiety disorder: Secondary | ICD-10-CM

## 2012-03-12 MED ORDER — HYDROXYZINE HCL 50 MG/ML IM SOLN
50.0000 mg | Freq: Four times a day (QID) | INTRAMUSCULAR | Status: DC | PRN
  Administered 2012-03-12: 50 mg via INTRAMUSCULAR
  Filled 2012-03-12: qty 1

## 2012-03-12 MED ORDER — METHYLPREDNISOLONE ACETATE 40 MG/ML IJ SUSP
80.0000 mg | Freq: Once | INTRAMUSCULAR | Status: AC
  Administered 2012-03-12: 80 mg via INTRAMUSCULAR

## 2012-03-12 MED ORDER — KETOROLAC TROMETHAMINE 30 MG/ML IJ SOLN
30.0000 mg | Freq: Once | INTRAMUSCULAR | Status: AC
  Administered 2012-03-12: 30 mg via INTRAMUSCULAR

## 2012-03-12 MED ORDER — KETOROLAC TROMETHAMINE 30 MG/ML IJ SOLN
INTRAMUSCULAR | Status: AC
  Filled 2012-03-12: qty 1

## 2012-03-12 MED ORDER — HYDROXYZINE HCL 50 MG/ML IM SOLN: 50.0000 mg | Freq: Four times a day (QID) | INTRAMUSCULAR | Status: DC | PRN

## 2012-03-12 MED ORDER — METHYLPREDNISOLONE ACETATE 80 MG/ML IJ SUSP
INTRAMUSCULAR | Status: AC
  Filled 2012-03-12: qty 1

## 2012-03-12 NOTE — ED Provider Notes (Signed)
History     CSN: [id number]  Arrival date & time 03/12/12  1845   First MD Initiated Contact with Patient 03/12/12 1856      Chief Complaint  Patient presents with  . Joint Swelling    swelling and pain , h/o lupus    (Consider location/radiation/quality/duration/timing/severity/associated sxs/prior treatment) Patient is a 41 y.o. female presenting with rash. The history is provided by the patient.  Rash  This is a chronic problem. The current episode started yesterday. The problem has been gradually worsening (hurting and swelling and skin itching beyond belief  dx'd with lupus in july. has appt next week with Dr. Vonna [place]yk. [place] The problem is associated with an unknown factor. There has been no fever. The rash is present on the face, right lower leg, left lower leg, left arm and right arm. The pain is moderate. Associated symptoms include itching.    Past Medical History  Diagnosis Date  . Irritable bowel syndrome   . Other and unspecified noninfectious gastroenteritis and colitis   . Depressive disorder, not elsewhere classified   . Anxiety state, unspecified   . Nausea alone   . Esophageal reflux   . Ulcerative (chronic) proctitis   . MS (multiple sclerosis)   . Barrett esophagus   . Allergy   . Anemia   . Depressive disorder, not elsewhere classified   . Hiatal hernia   . Iron deficiency anemia, unspecified   . Kidney infection   . Lupus   . RA (rheumatoid arthritis)     Past Surgical History  Procedure Date  . Nasal sinus surgery   . Mandible fracture surgery   . Appendectomy   . Cesarean section   . Partial hysterectomy     Family History  Problem Relation Age of Onset  . Colon cancer Neg Hx   . Diabetes Paternal Grandmother   . Diabetes Paternal Grandfather   . Clotting disorder Father     History  Substance Use Topics  . Smoking status: Never Smoker   . Smokeless tobacco: Never Used  . Alcohol Use: Yes     Comment: rare    OB History    Grav Para Term Preterm Abortions TAB SAB Ect Mult Living                  Review of Systems  Constitutional: Negative.   Musculoskeletal: Negative.   Skin: Positive for itching, rash and wound.    Allergies  Review of patient's allergies indicates no known allergies.  Home Medications   Current Outpatient Rx  Name  Route  Sig  Dispense  Refill  . FLUTICASONE PROPIONATE 50 MCG/ACT NA SUSP   Nasal   Place 2 sprays into the nose daily.         Marland Kitchen LORAZEPAM 0.5 MG PO TABS   Oral   Take 0.5 mg by mouth every 8 (eight) hours.         Marland Kitchen PAROXETINE HCL 10 MG PO TABS   Oral   Take 10 mg by mouth daily.         Marland Kitchen PREDNISONE (PAK) 5 MG PO TABS   Oral   Take 10 mg by mouth.         . TRAZODONE HCL 50 MG PO TABS   Oral   Take 50 mg by mouth at bedtime.         . AMBULATORY NON FORMULARY MEDICATION      Medication Name: GI COCKTAIL  20  cc Maalox 10 cc Donatal 20 cc Xylocaine   1 tbs every 4-6 hours as needed.   1200 mL   3   . AMPHETAMINE-DEXTROAMPHET ER 30 MG PO CP24   Oral   Take 30 mg by mouth every morning.         Marland Kitchen DIAZEPAM 10 MG PO TABS   Oral   Take 10 mg by mouth every 6 (six) hours as needed. For anxiety         . ESOMEPRAZOLE MAGNESIUM 40 MG PO CPDR   Oral   Take 40 mg by mouth 2 (two) times daily.          Marland Kitchen FERROUS SULFATE 325 (65 FE) MG PO TABS   Oral   Take 1 tablet (325 mg total) by mouth 3 (three) times daily with meals.   90 tablet   0   . FLUVOXAMINE MALEATE 50 MG PO TABS   Oral   Take 50 mg by mouth daily.         Marland Kitchen ONDANSETRON 8 MG PO TBDP      Take 1 tab on the tongue twice daily as needed for nausea   60 tablet   2     BP 150/75  Pulse 121  Temp 98.6 F (37 C) (Oral)  Resp 23  SpO2 100%  Physical Exam  Nursing note and vitals reviewed. Constitutional: She is oriented to person, place, and time. She appears well-developed and well-nourished.  HENT:  Head: Normocephalic.  Neurological: She is alert and  oriented to person, place, and time.  Skin: Skin is warm and dry.       No sts or edema, diffuse excoriations, no infections.  Psychiatric: Her mood appears anxious. Her affect is labile and inappropriate. Her speech is rapid and/or pressured. She is agitated and is hyperactive. She expresses impulsivity.    ED Course  Procedures (including critical care time)  Labs Reviewed - No data to display No results found.   1. Acute anxiety       MDM          Linna Hoff, MD 03/12/12 1924

## 2012-03-12 NOTE — ED Notes (Signed)
Pt states she has hx of lupus and she started with swelling and severe pain yesterday. States she  Can't take the pain.

## 2012-05-23 ENCOUNTER — Emergency Department (HOSPITAL_COMMUNITY)
Admission: EM | Admit: 2012-05-23 | Discharge: 2012-05-23 | Disposition: A | Discharge status: Home / Self Care | Payer: Medicaid Other | Attending: Emergency Medicine | Admitting: Emergency Medicine

## 2012-05-23 ENCOUNTER — Ambulatory Visit (INDEPENDENT_AMBULATORY_CARE_PROVIDER_SITE_OTHER): Payer: Self-pay | Admitting: Emergency Medicine

## 2012-05-23 ENCOUNTER — Emergency Department (HOSPITAL_COMMUNITY): Payer: Medicaid Other

## 2012-05-23 ENCOUNTER — Encounter: Payer: Self-pay | Admitting: Emergency Medicine

## 2012-05-23 VITALS — BP 124/82 | HR 120 | Temp 97.6°F | Resp 20 | Ht 66.0 in | Wt 128.4 lb

## 2012-05-23 DIAGNOSIS — F3289 Other specified depressive episodes: Secondary | ICD-10-CM | POA: Insufficient documentation

## 2012-05-23 DIAGNOSIS — R079 Chest pain, unspecified: Secondary | ICD-10-CM

## 2012-05-23 DIAGNOSIS — R61 Generalized hyperhidrosis: Secondary | ICD-10-CM | POA: Insufficient documentation

## 2012-05-23 DIAGNOSIS — F419 Anxiety disorder, unspecified: Secondary | ICD-10-CM

## 2012-05-23 DIAGNOSIS — R002 Palpitations: Secondary | ICD-10-CM | POA: Insufficient documentation

## 2012-05-23 DIAGNOSIS — R072 Precordial pain: Secondary | ICD-10-CM | POA: Insufficient documentation

## 2012-05-23 DIAGNOSIS — Z87448 Personal history of other diseases of urinary system: Secondary | ICD-10-CM | POA: Insufficient documentation

## 2012-05-23 DIAGNOSIS — K219 Gastro-esophageal reflux disease without esophagitis: Secondary | ICD-10-CM | POA: Insufficient documentation

## 2012-05-23 DIAGNOSIS — R350 Frequency of micturition: Secondary | ICD-10-CM | POA: Insufficient documentation

## 2012-05-23 DIAGNOSIS — R110 Nausea: Secondary | ICD-10-CM | POA: Insufficient documentation

## 2012-05-23 DIAGNOSIS — F411 Generalized anxiety disorder: Secondary | ICD-10-CM | POA: Insufficient documentation

## 2012-05-23 DIAGNOSIS — Z8669 Personal history of other diseases of the nervous system and sense organs: Secondary | ICD-10-CM | POA: Insufficient documentation

## 2012-05-23 DIAGNOSIS — F329 Major depressive disorder, single episode, unspecified: Secondary | ICD-10-CM | POA: Insufficient documentation

## 2012-05-23 DIAGNOSIS — R51 Headache: Secondary | ICD-10-CM | POA: Insufficient documentation

## 2012-05-23 DIAGNOSIS — Z8719 Personal history of other diseases of the digestive system: Secondary | ICD-10-CM | POA: Insufficient documentation

## 2012-05-23 DIAGNOSIS — Z79899 Other long term (current) drug therapy: Secondary | ICD-10-CM | POA: Insufficient documentation

## 2012-05-23 DIAGNOSIS — Z8739 Personal history of other diseases of the musculoskeletal system and connective tissue: Secondary | ICD-10-CM | POA: Insufficient documentation

## 2012-05-23 DIAGNOSIS — D649 Anemia, unspecified: Secondary | ICD-10-CM | POA: Insufficient documentation

## 2012-05-23 LAB — URINALYSIS, ROUTINE W REFLEX MICROSCOPIC
Bilirubin Urine: NEGATIVE
Glucose, UA: NEGATIVE mg/dL
Ketones, ur: NEGATIVE mg/dL
pH: 8 (ref 5.0–8.0)

## 2012-05-23 LAB — CBC
Hemoglobin: 14 g/dL (ref 12.0–15.0)
MCH: 33.3 pg (ref 26.0–34.0)
MCV: 98.3 fL (ref 78.0–100.0)
RBC: 4.2 MIL/uL (ref 3.87–5.11)

## 2012-05-23 LAB — URINE MICROSCOPIC-ADD ON

## 2012-05-23 LAB — BASIC METABOLIC PANEL
CO2: 22 mEq/L (ref 19–32)
Calcium: 9.3 mg/dL (ref 8.4–10.5)
Creatinine, Ser: 1.04 mg/dL (ref 0.50–1.10)
Glucose, Bld: 108 mg/dL — ABNORMAL HIGH (ref 70–99)

## 2012-05-23 LAB — TROPONIN I: Troponin I: <0.3 ng/mL (ref <0.30)

## 2012-05-23 LAB — POCT I-STAT TROPONIN I

## 2012-05-23 MED ORDER — LORAZEPAM 2 MG/ML IJ SOLN
1.0000 mg | Freq: Once | INTRAMUSCULAR | Status: AC
  Administered 2012-05-23: 1 mg via INTRAVENOUS
  Filled 2012-05-23: qty 1

## 2012-05-23 MED ORDER — LORAZEPAM 1 MG PO TABS: 1.0000 mg | ORAL_TABLET | Freq: Three times a day (TID) | ORAL | Status: DC | PRN

## 2012-05-23 MED ORDER — KETOROLAC TROMETHAMINE 30 MG/ML IJ SOLN
30.0000 mg | Freq: Once | INTRAMUSCULAR | Status: AC
  Administered 2012-05-23: 30 mg via INTRAVENOUS
  Filled 2012-05-23: qty 1

## 2012-05-23 MED ORDER — METOCLOPRAMIDE HCL 5 MG/ML IJ SOLN
10.0000 mg | Freq: Once | INTRAMUSCULAR | Status: AC
  Administered 2012-05-23: 10 mg via INTRAVENOUS
  Filled 2012-05-23: qty 2

## 2012-05-23 MED ORDER — METOCLOPRAMIDE HCL 10 MG PO TABS: 10.0000 mg | ORAL_TABLET | Freq: Four times a day (QID) | ORAL | Status: DC

## 2012-05-23 MED ORDER — DIPHENHYDRAMINE HCL 50 MG/ML IJ SOLN
25.0000 mg | Freq: Once | INTRAMUSCULAR | Status: AC
  Administered 2012-05-23: 25 mg via INTRAVENOUS
  Filled 2012-05-23: qty 1

## 2012-05-23 MED ORDER — ASPIRIN 325 MG PO TABS
325.0000 mg | ORAL_TABLET | Freq: Once | ORAL | Status: AC
  Administered 2012-05-23: 325 mg via ORAL
  Filled 2012-05-23: qty 1

## 2012-05-23 NOTE — ED Notes (Signed)
Pt given peanut butter, crackers, and diet cokes.

## 2012-05-23 NOTE — ED Provider Notes (Signed)
History     CSN: [id number]  Arrival date & time 05/23/12  1619   First MD Initiated Contact with Patient 05/23/12 1621      Chief Complaint  Patient presents with  . Chest Pain    (Consider location/radiation/quality/duration/timing/severity/associated sxs/prior treatment) The history is provided by the patient.  YOCHEVED DEPNER is a 42 y.o. female history of lupus on paxil, anxiety, IBS here with chest pain, headaches, nausea. She ran out of her Ativan a week ago. For the last 3 days she's been having some intermittent nausea as well as some headache. She has history of migraines and this is typical for migraines. She took Topamax but it did not help. Today around 1pm, she had substernal chest pain that is pleuritic in nature. She also had palpitations and diaphoresis. She felt very anxious about her symptoms. Went to urgent care and was sent for eval. She had similar episode last year and was diagnosed with UTI. She is urinating frequently but denies dysuria. No recent travel no hx of DVT. No hx of CAD. Not a smoker.    Past Medical History  Diagnosis Date  . Irritable bowel syndrome   . Other and unspecified noninfectious gastroenteritis and colitis   . Depressive disorder, not elsewhere classified   . Anxiety state, unspecified   . Nausea alone   . Esophageal reflux   . Ulcerative (chronic) proctitis   . MS (multiple sclerosis)   . Barrett esophagus   . Allergy   . Anemia   . Depressive disorder, not elsewhere classified   . Hiatal hernia   . Iron deficiency anemia, unspecified   . Kidney infection   . Lupus   . RA (rheumatoid arthritis)     Past Surgical History  Procedure Date  . Nasal sinus surgery   . Mandible fracture surgery   . Appendectomy   . Cesarean section   . Partial hysterectomy     Family History  Problem Relation Age of Onset  . Colon cancer Neg Hx   . Diabetes Paternal Grandmother   . Diabetes Paternal Grandfather   . Clotting disorder Father      History  Substance Use Topics  . Smoking status: Never Smoker   . Smokeless tobacco: Never Used  . Alcohol Use: Yes     Comment: rare    OB History    Grav Para Term Preterm Abortions TAB SAB Ect Mult Living                  Review of Systems  Cardiovascular: Positive for chest pain.  Gastrointestinal: Positive for nausea.  Neurological: Positive for headaches.  All other systems reviewed and are negative.    Allergies  Review of patient's allergies indicates no known allergies.  Home Medications   Current Outpatient Rx  Name  Route  Sig  Dispense  Refill  . AMPHETAMINE-DEXTROAMPHET ER 30 MG PO CP24   Oral   Take 30 mg by mouth every morning.         Marland Kitchen ESOMEPRAZOLE MAGNESIUM 40 MG PO CPDR   Oral   Take 40 mg by mouth 2 (two) times daily.          Marland Kitchen FERROUS SULFATE 325 (65 FE) MG PO TABS   Oral   Take 1 tablet (325 mg total) by mouth 3 (three) times daily with meals.   90 tablet   0   . FLUTICASONE PROPIONATE 50 MCG/ACT NA SUSP   Nasal  Place 2 sprays into the nose daily.         Marland Kitchen BACID PO TABS   Oral   Take 2 tablets by mouth daily.         Marland Kitchen LORAZEPAM 0.5 MG PO TABS   Oral   Take 0.5 mg by mouth every 8 (eight) hours.         . ONDANSETRON 8 MG PO TBDP      Take 1 tab on the tongue twice daily as needed for nausea   60 tablet   2   . PAROXETINE HCL 10 MG PO TABS   Oral   Take 10 mg by mouth daily.         . TOPIRAMATE 100 MG PO TABS   Oral   Take 100 mg by mouth daily.         . TRAZODONE HCL 50 MG PO TABS   Oral   Take 50 mg by mouth at bedtime.           BP 111/78  Pulse 92  Temp 99.3 F (37.4 C) (Oral)  Resp 16  SpO2 100%  Physical Exam  Nursing note and vitals reviewed. Constitutional: She is oriented to person, place, and time. She appears well-developed and well-nourished.       Anxious   HENT:  Head: Normocephalic.  Mouth/Throat: Oropharynx is clear and moist.  Eyes: Conjunctivae normal are  normal. Pupils are equal, round, and reactive to light.  Neck: Normal range of motion. Neck supple.  Cardiovascular: Regular rhythm and normal heart sounds.        Slightly tachy   Pulmonary/Chest: Effort normal and breath sounds normal. No respiratory distress. She has no wheezes. She has no rales.       + reproducible tenderness on substernal area.   Abdominal: Soft. Bowel sounds are normal. She exhibits no distension. There is no tenderness. There is no rebound.  Musculoskeletal: Normal range of motion.  Neurological: She is alert and oriented to person, place, and time.  Skin: Skin is warm and dry.  Psychiatric: She has a normal mood and affect. Her behavior is normal. Judgment and thought content normal.    ED Course  Procedures (including critical care time)  Labs Reviewed  BASIC METABOLIC PANEL - Abnormal; Notable for the following:    Potassium 3.4 (*)     Glucose, Bld 108 (*)     GFR calc non Af Amer 67 (*)     GFR calc Af Amer 77 (*)     All other components within normal limits  URINALYSIS, ROUTINE W REFLEX MICROSCOPIC - Abnormal; Notable for the following:    APPearance TURBID (*)     All other components within normal limits  URINE MICROSCOPIC-ADD ON - Abnormal; Notable for the following:    Squamous Epithelial / LPF FEW (*)     All other components within normal limits  CBC  D-DIMER, QUANTITATIVE  PREGNANCY, URINE  POCT I-STAT TROPONIN I  TROPONIN I   Dg Chest 2 View  05/23/2012  *RADIOLOGY REPORT*  Clinical Data: Chest pain, abnormal EKG, shortness of breath, history lupus  CHEST - 2 VIEW  Comparison: 04/08/2010  Findings: Normal heart size, mediastinal contours, and pulmonary vascularity. Lungs well expanded and clear. No pleural effusion or pneumothorax. Bones unremarkable.  IMPRESSION: No acute abnormalities.   Original Report Authenticated By: Ulyses Southward, M.D.      No diagnosis found.   Date: 05/23/2012  Rate: 95  Rhythm:  normal sinus rhythm  QRS Axis:  normal  Intervals: normal  ST/T Wave abnormalities: nonspecific ST changes  Conduction Disutrbances:none  Narrative Interpretation: + PACs   Old EKG Reviewed: changes noted    MDM  ANNIBELLE BRAZIE is a 42 y.o. female here with chest pain, headache, nausea. Headache and nausea likely from migraines. She has no red flags for subarachnoid hemorrhage. Chest pain is reproducible and is likely MSK strain vs anxiety. Also low risk for ACS and PE so will get trop x 2, d-dimer.   10:06 PM Trop neg x 2. D-dimer neg. Pain and headache improved with reglan, toradol, ativan. I think she likely has anxiety attack. Will refill her ativan and give her a prescription for reglan prn headaches.         Richardean Canal, MD 05/23/12 2207

## 2012-05-23 NOTE — ED Notes (Signed)
Pt in radiology 

## 2012-05-23 NOTE — Progress Notes (Signed)
[facility] [address], [place] [address] [phone]  Date:  05/23/2012   Name:  Brandi Santiago   DOB:  1970/06/18   MRN:  [id number]  PCP:  Aura Dials, MD    Chief Complaint: No chief complaint on file.   History of Present Illness:  Brandi Santiago is a 42 y.o. very pleasant female patient who presents with the following:  Chest pain that started one hour ago in center of chest.  Says that it feels as though someone punched her in the chest.  Has been out of medications for one week.  Is very anxious and feels as though she is jumping out of her skin.  No palpitations.  Feels as though her heart is racing. No diaphoresis, shortness of breath.  Some nausea no vomiting. Pain not radiating.  In school and not working. Under a lot of pressure with school and two children, one of whom has rather severe ADD and is not doing well academically.  Not sleeping past three days and has continuous heartburn not relieved by tums and OTC zantac.  Pain is pleuritic and worse with deep breathing.   Patient Active Problem List  Diagnosis  . ANXIETY, CHRONIC  . DEPRESSION  . GASTROESOPHAGEAL REFLUX DISEASE, CHRONIC  . NAUSEA, CHRONIC  . ADD (attention deficit disorder with hyperactivity)  . Hx of migraine headaches  . Acne cystica  . Fe deficiency anemia  . Pyelonephritis  . Anemia  . IBS (irritable bowel syndrome)  . Abnormal LFTs  . Abdominal pain  . Obsessive compulsive disorder    Past Medical History  Diagnosis Date  . Irritable bowel syndrome   . Other and unspecified noninfectious gastroenteritis and colitis   . Depressive disorder, not elsewhere classified   . Anxiety state, unspecified   . Nausea alone   . Esophageal reflux   . Ulcerative (chronic) proctitis   . MS (multiple sclerosis)   . Barrett esophagus   . Allergy   . Anemia   . Depressive disorder, not elsewhere classified   . Hiatal hernia   . Iron deficiency anemia, unspecified   .  Kidney infection   . Lupus   . RA (rheumatoid arthritis)     Past Surgical History  Procedure Date  . Nasal sinus surgery   . Mandible fracture surgery   . Appendectomy   . Cesarean section   . Partial hysterectomy     History  Substance Use Topics  . Smoking status: Never Smoker   . Smokeless tobacco: Never Used  . Alcohol Use: Yes     Comment: rare    Family History  Problem Relation Age of Onset  . Colon cancer Neg Hx   . Diabetes Paternal Grandmother   . Diabetes Paternal Grandfather   . Clotting disorder Father     No Known Allergies  Medication list has been reviewed and updated.  Current Outpatient Prescriptions on File Prior to Visit  Medication Sig Dispense Refill  . AMBULATORY NON FORMULARY MEDICATION Medication Name: GI COCKTAIL  20 cc Maalox 10 cc Donatal 20 cc Xylocaine   1 tbs every 4-6 hours as needed.  1200 mL  3  . amphetamine-dextroamphetamine (ADDERALL XR) 30 MG 24 hr capsule Take 30 mg by mouth every morning.      . diazepam (VALIUM) 10 MG tablet Take 10 mg by mouth every 6 (six) hours as needed. For anxiety      . esomeprazole (NEXIUM) 40  MG capsule Take 40 mg by mouth 2 (two) times daily.       . ferrous sulfate (FERROUSUL) 325 (65 FE) MG tablet Take 1 tablet (325 mg total) by mouth 3 (three) times daily with meals.  90 tablet  0  . fluticasone (FLONASE) 50 MCG/ACT nasal spray Place 2 sprays into the nose daily.      . fluvoxaMINE (LUVOX) 50 MG tablet Take 50 mg by mouth daily.      Marland Kitchen LORazepam (ATIVAN) 0.5 MG tablet Take 0.5 mg by mouth every 8 (eight) hours.      . ondansetron (ZOFRAN-ODT) 8 MG disintegrating tablet Take 1 tab on the tongue twice daily as needed for nausea  60 tablet  2  . PARoxetine (PAXIL) 10 MG tablet Take 10 mg by mouth daily.      . predniSONE (STERAPRED UNI-PAK) 5 MG TABS Take 10 mg by mouth.      . traZODone (DESYREL) 50 MG tablet Take 50 mg by mouth at bedtime.        Review of Systems:  As per HPI, otherwise  negative.    Physical Examination: There were no vitals filed for this visit. There were no vitals filed for this visit. There is no height or weight on file to calculate BMI. Ideal Body Weight:    GEN: WDWN, NAD, Non-toxic, A & O x 3 HEENT: Atraumatic, Normocephalic. Neck supple. No masses, No LAD. Ears and Nose: No external deformity. CV: RRR, No M/G/R. No JVD. No thrill. No extra heart sounds.  Chest wall tenderness over sternum.  Reproduces pain in chest. PULM: CTA B, no wheezes, crackles, rhonchi. No retractions. No resp. distress. No accessory muscle use. ABD: S, NT, ND, +BS. No rebound. No HSM. EXTR: No c/c/e NEURO Normal gait.  PSYCH: Normally interactive. Conversant. Not depressed or anxious appearing.  Calm demeanor.    Assessment and Plan: Chest wall pain SLE Rheumatoid arthritis Noncompliance with medications TO ER via EMS   Carmelina Dane, MD

## 2012-05-23 NOTE — ED Notes (Signed)
Presents with chest pain that began while watching a movie at 13:00 today. Pain is described as sharp and located in center of chest worse with touch, deep inspiration. Pain does not radiate. Reports nausea and reflux for the past three days. She recently ran out of her Lorazepam and was unsure if this is reason for pain. She states she is anxious and has a migraine as well. Pain is rated 10/10.

## 2012-06-18 ENCOUNTER — Other Ambulatory Visit: Payer: Self-pay

## 2012-07-13 ENCOUNTER — Telehealth: Payer: Self-pay | Admitting: Gastroenterology

## 2012-07-14 ENCOUNTER — Encounter: Payer: Self-pay | Admitting: Physician Assistant

## 2012-07-14 ENCOUNTER — Ambulatory Visit (INDEPENDENT_AMBULATORY_CARE_PROVIDER_SITE_OTHER): Payer: Medicaid Other | Admitting: Physician Assistant

## 2012-07-14 VITALS — BP 110/68 | HR 160 | Ht 66.0 in | Wt 136.0 lb

## 2012-07-14 DIAGNOSIS — I471 Supraventricular tachycardia: Secondary | ICD-10-CM

## 2012-07-14 DIAGNOSIS — R1013 Epigastric pain: Secondary | ICD-10-CM

## 2012-07-14 DIAGNOSIS — K219 Gastro-esophageal reflux disease without esophagitis: Secondary | ICD-10-CM

## 2012-07-14 DIAGNOSIS — R079 Chest pain, unspecified: Secondary | ICD-10-CM

## 2012-07-14 MED ORDER — ESOMEPRAZOLE MAGNESIUM 40 MG PO CPDR: DELAYED_RELEASE_CAPSULE | ORAL | Status: DC

## 2012-07-14 MED ORDER — LORAZEPAM 1 MG PO TABS: 1.0000 mg | ORAL_TABLET | Freq: Three times a day (TID) | ORAL | Status: DC | PRN

## 2012-07-14 NOTE — Patient Instructions (Addendum)
We sent prescription refill on the Nexium for once daily.  However, we gave you samples to take Nexium twice daily imto; the samples are gone. We are faxing the the refill of Ativan to [facility] [address].  Go to  [facility].  [name] Esterwood PA  Called the triage nurse there to advise them of your heart rate.

## 2012-07-14 NOTE — Progress Notes (Signed)
Subjective:    Patient ID: Brandi Santiago, female    DOB: 01-14-1971, 42 y.o.   MRN: [id number]  HPI Brandi Santiago is a 42 year old white female known to Dr. Jarold Santiago. She has history of ADD, GERD, and IBS. She had been treated for an episode of pyelonephritis in 2013. She had undergone colonoscopy in August of 2012 which was normal. She comes in today stating that she had been diagnosed with lupus and rheumatoid arthritis within the past couple of months and had seen a rheumatologist in [place] [facility] named Brandi Santiago. She has been on low-dose prednisone she believes 5 mg twice daily over the past month or so She states she has been having pains in her chest over the past 3-1/2 week which she describes as a tightness and pressure. She has had some nausea and occasional episodes of vomiting and says on occasion she is brought up tiny little clots. She says over the past 3 weeks she has just not felt right and has been told that her pulse is been elevated and that this is due to "panic attacks". She says sometimes the pain in her chest is so uncomfortable she feels short of breath. She has been eating a lot of calm which does not alleviate the pain. She also has a sense of having heartburn. He had been prescribed Nexium previously and has not had any in the past couple of months. Also has not been taking her regular ADD medicines over the past few weeks and had run out of lorazepam. He had an ER visit in January of 2014 with complaints of chest pain. At that time troponins were negative as she had some nonspecific EKG changes per the notes-R. rate was documented in the 90s. She says she has been taking her pulse at home and has been running anywhere from 105- 178 over the past couple of weeks and it has been consistently very fast over the past few days and this makes her generally feel bad.    Review of Systems  Constitutional: Positive for appetite change and fatigue.  HENT: Negative.   Eyes: Negative.    Respiratory: Positive for chest tightness and shortness of breath.   Cardiovascular: Positive for chest pain and palpitations.  Gastrointestinal: Positive for abdominal pain.  Endocrine: Negative.   Genitourinary: Negative.   Musculoskeletal: Negative.   Skin: Negative.   Allergic/Immunologic: Negative.   Neurological: Negative.   Hematological: Negative.   Psychiatric/Behavioral: The patient is nervous/anxious.    Outpatient Prescriptions Prior to Visit  Medication Sig Dispense Refill  . ferrous sulfate (FERROUSUL) 325 (65 FE) MG tablet Take 1 tablet (325 mg total) by mouth 3 (three) times daily with meals.  90 tablet  0  . lactobacillus acidophilus (BACID) TABS Take 1 tablet by mouth daily.       Marland Kitchen LORazepam (ATIVAN) 1 MG tablet Take 1 tablet (1 mg total) by mouth 3 (three) times daily as needed for anxiety.  15 tablet  0  . amphetamine-dextroamphetamine (ADDERALL XR) 30 MG 24 hr capsule Take 30 mg by mouth every morning.      . fluticasone (FLONASE) 50 MCG/ACT nasal spray Place 2 sprays into the nose daily.      Marland Kitchen PARoxetine (PAXIL) 10 MG tablet Take 10 mg by mouth daily.      Marland Kitchen topiramate (TOPAMAX) 100 MG tablet Take 100 mg by mouth daily.      . traZODone (DESYREL) 50 MG tablet Take 50 mg by mouth at bedtime.      Marland Kitchen  esomeprazole (NEXIUM) 40 MG capsule Take 40 mg by mouth 2 (two) times daily.       Marland Kitchen LORazepam (ATIVAN) 0.5 MG tablet Take 0.5 mg by mouth every 8 (eight) hours.      . metoCLOPramide (REGLAN) 10 MG tablet Take 1 tablet (10 mg total) by mouth every 6 (six) hours.  20 tablet  0  . ondansetron (ZOFRAN-ODT) 8 MG disintegrating tablet Take 1 tab on the tongue twice daily as needed for nausea  60 tablet  2   No facility-administered medications prior to visit.   No Known Allergies   Patient Active Problem List  Diagnosis  . ANXIETY, CHRONIC  . DEPRESSION  . GASTROESOPHAGEAL REFLUX DISEASE, CHRONIC  . NAUSEA, CHRONIC  . ADD (attention deficit disorder with  hyperactivity)  . Hx of migraine headaches  . Acne cystica  . Fe deficiency anemia  . Pyelonephritis  . Anemia  . IBS (irritable bowel syndrome)  . Abnormal LFTs  . Abdominal pain  . Obsessive compulsive disorder   History  Substance Use Topics  . Smoking status: Never Smoker   . Smokeless tobacco: Never Used  . Alcohol Use: Yes     Comment: rare   family history includes Clotting disorder in her father and Diabetes in her paternal grandfather and paternal grandmother.  There is no history of Colon cancer.     Objective:   Physical Exam well-developed white female in no acute distress, anxious blood pressure 110/68 pulse on repeated check between 160 and 170. Height 5 foot 6 weight 136... HEENT nontraumatic normocephalic EOMI PERRLA sclera anicteric, Supple no JVD, Cardiovascular very tachycardic, sounds regular with occasional ectopic no murmur rub or gallop Pulmonary clear bilaterally, Abdomen; soft ,minimally, tender in the epigastric area no guarding or rebound, No palpable mass or hepatosplenomegaly bowel sounds are active, Rectal; exam not done, Extremities; no clubbing cyanosis or edema skin warm and dry, Psych; mood and affect appropriate she is frustrated        Assessment & Plan:  #16 42 year old female with a significant tachycardia with heart rate of of 160-suspect SVT Patient had been told recently that this was due to panic attacks and is frustrated #2 chest pain-etiology is not clear this may be secondary to above, he may also have a component of reflux esophagitis #3 history of ADD #4 history of IBS #5 patient reporting new diagnosis of lupus and rheumatoid arthritis-apparently on low-dose steroids  Plan; patient is advised to go to the emergency room at [facility] for further evaluation of her tachycardia-she is reluctant to go as she had evaluation about a month ago however at that time her heart rate was documented the 90s. After long discussion with patient she  is agreeable to ER evaluation Start Nexium 40 mg by mouth twice daily over the next couple of weeks, samples given Patient asked for a refill of her lorazepam-have given a limited prescription 1 mg by mouth 3 times daily as needed #40 and no refills. Other prescriptions need to be through her primary care Syliva Mee. We can see her back in followup in 2 weeks, after her cardiac issues are sorted out.

## 2012-07-14 NOTE — Telephone Encounter (Signed)
Pt of Dr Norval Gable last seen 11/24/11; hx of Migraines, GERD, N/V, IBS, Diarrhea, ADD, Anxiety Disorder, Mesenteric Lymphedenopathy. CT was ordered which showed improving Pyleonephritis. She was seen in the ER on 05/23/12 for CP. The ER DOC thought her problem was d/t an anxiety attack, since pt had run out of her anxiety med. Today, she c/o LLQ pain for a few days. She states she always has diarrhea and now it is mixed with blood; also c/o pain between her breasts, mid sternal area.  Pt given an appt with Mike Gip, PA this pm..

## 2012-07-25 ENCOUNTER — Encounter: Payer: Self-pay | Admitting: Emergency Medicine

## 2012-07-25 ENCOUNTER — Encounter: Payer: Self-pay | Admitting: Cardiology

## 2012-08-22 ENCOUNTER — Encounter (HOSPITAL_COMMUNITY): Payer: Self-pay

## 2012-08-23 ENCOUNTER — Institutional Professional Consult (permissible substitution): Payer: Self-pay | Admitting: Cardiovascular Disease

## 2012-08-25 ENCOUNTER — Encounter: Payer: Self-pay | Admitting: Emergency Medicine

## 2012-09-08 ENCOUNTER — Ambulatory Visit (INDEPENDENT_AMBULATORY_CARE_PROVIDER_SITE_OTHER): Payer: Medicaid Other | Admitting: Cardiology

## 2012-09-08 ENCOUNTER — Encounter: Payer: Self-pay | Admitting: Cardiology

## 2012-09-08 ENCOUNTER — Ambulatory Visit: Payer: Self-pay | Admitting: Gastroenterology

## 2012-09-08 VITALS — BP 117/80 | HR 128 | Wt 144.0 lb

## 2012-09-08 DIAGNOSIS — R079 Chest pain, unspecified: Secondary | ICD-10-CM | POA: Insufficient documentation

## 2012-09-08 DIAGNOSIS — R000 Tachycardia, unspecified: Secondary | ICD-10-CM | POA: Insufficient documentation

## 2012-09-08 NOTE — Progress Notes (Signed)
HPI: 42 year old female for evaluation of tachycardia. Patient seen in the emergency room in January of 2014 with chest pain. Chest x-ray showed no acute abnormalities. Electrocardiogram showed sinus rhythm with PACs and nonspecific ST changes. Troponin normal and d-dimer negative. Hemoglobin 10.3. Electrocardiogram showed sinus rhythm, RV conduction delay and left axis deviation. Patient felt to be having an anxiety attack. Patient states that for the past several months she has had chest pain. It is in the left chest area and described as someone punching her. It radiates to her stomach. It has been continuous for several months without completely resolving. It is not related to food or position. It occurs both with exertion and at rest. She also has noticed an elevated heart rate. It has been as high as 178 by her report. This is a daily occurrence. She states she has gained 37 pounds. She has migraine headaches. She has dyspnea both with exertion and at rest. Because of the above we were asked to evaluate.  Current Outpatient Prescriptions  Medication Sig Dispense Refill  . esomeprazole (NEXIUM) 40 MG capsule Take 1 cap once daily before breakfast.  90 capsule  3  . FOLIC ACID PO Take 1 tablet by mouth daily.      Marland Kitchen LORazepam (ATIVAN) 1 MG tablet Take 1 tablet (1 mg total) by mouth 3 (three) times daily as needed for anxiety.  40 tablet  0  . metoprolol succinate (TOPROL-XL) 25 MG 24 hr tablet Take 25 mg by mouth daily.      . Probiotic Product (PROBIOTIC DAILY PO) Take 1 tablet by mouth daily.      Marland Kitchen topiramate (TOPAMAX) 100 MG tablet Take 100 mg by mouth daily.      Marland Kitchen venlafaxine (EFFEXOR) 75 MG tablet Take 75 mg by mouth 2 (two) times daily.       No current facility-administered medications for this visit.    No Known Allergies  Past Medical History  Diagnosis Date  . Irritable bowel syndrome   . Depressive disorder, not elsewhere classified   . Anxiety state, unspecified   .  Esophageal reflux   . Ulcerative (chronic) proctitis   . MS (multiple sclerosis)   . Barrett esophagus   . Allergy   . Anemia   . Hiatal hernia   . Iron deficiency anemia, unspecified   . Kidney infection   . Lupus   . RA (rheumatoid arthritis)   . Tachycardia   . Fibromyalgia     Past Surgical History  Procedure Laterality Date  . Nasal sinus surgery    . Mandible fracture surgery    . Appendectomy    . Cesarean section    . Partial hysterectomy      History   Social History  . Marital Status: Divorced    Spouse Name: N/A    Number of Children: 2  . Years of Education: N/A   Occupational History  . Student    Social History Main Topics  . Smoking status: Never Smoker   . Smokeless tobacco: Never Used  . Alcohol Use: Yes     Comment: rare  . Drug Use: No  . Sexually Active: Not on file   Other Topics Concern  . Not on file   Social History Narrative  . No narrative on file    Family History  Problem Relation Age of Onset  . Colon cancer Neg Hx   . Diabetes Paternal Grandmother   . Diabetes Paternal Grandfather   .  Clotting disorder Father     ROS: headaches and hematochezia but no fevers or chills, productive cough, hemoptysis, dysphasia, odynophagia, melena, hematochezia, dysuria, hematuria, rash, seizure activity, orthopnea, PND, pedal edema, claudication. Remaining systems are negative.  Physical Exam:   Blood pressure 117/80, pulse 128, weight 144 lb (65.318 kg).  General:  Well developed/well nourished in NAD Skin warm/dry Patient not depressed, anxious appearing No peripheral clubbing Back-normal HEENT-normal/normal eyelids Neck supple/normal carotid upstroke bilaterally; no bruits; no JVD; no thyromegaly chest - CTA/ normal expansion CV - tachycardic but regular rhythm/normal S1 and S2; no murmurs, rubs or gallops;  PMI nondisplaced Abdomen -NT/ND, no HSM, no mass, + bowel sounds, no bruit 2+ femoral pulses, no bruits Ext-no edema,  chords, 2+ DP Neuro-grossly nonfocal  ECG sinus tachycardia at a rate of 120. Cannot rule out prior inferior infarct. No ST changes. No delta wave.

## 2012-09-08 NOTE — Assessment & Plan Note (Signed)
Symptoms are not consistent with cardiac etiology. They have been continuous for several months without completely resolving. Her electrocardiogram does show question prior inferior infarct. Schedule echocardiogram to assess LV function and wall motion. If normal no further ischemia evaluation.

## 2012-09-08 NOTE — Patient Instructions (Addendum)
Your physician recommends that you schedule a follow-up appointment in: 8 WEEKS WITH DR Jens Som  Your physician has recommended that you wear a 48 HOUR holter monitor. Holter monitors are medical devices that record the heart's electrical activity. Doctors most often use these monitors to diagnose arrhythmias. Arrhythmias are problems with the speed or rhythm of the heartbeat. The monitor is a small, portable device. You can wear one while you do your normal daily activities. This is usually used to diagnose what is causing palpitations/syncope (passing out).  Your physician has requested that you have an echocardiogram. Echocardiography is a painless test that uses sound waves to create images of your heart. It provides your doctor with information about the size and shape of your heart and how well your heart's chambers and valves are working. This procedure takes approximately one hour. There are no restrictions for this procedure.

## 2012-09-08 NOTE — Assessment & Plan Note (Signed)
Patient had recent blood work including thyroid and hemoglobin by her report. I will ask for those results. We will schedule an echocardiogram to assess LV function. I will also schedule a 48 hour Holter monitor to further assess. She will continue on her beta blocker for now. I think there is likely a component of anxiety.

## 2012-09-09 ENCOUNTER — Other Ambulatory Visit (INDEPENDENT_AMBULATORY_CARE_PROVIDER_SITE_OTHER): Payer: Medicaid Other

## 2012-09-09 ENCOUNTER — Ambulatory Visit (INDEPENDENT_AMBULATORY_CARE_PROVIDER_SITE_OTHER): Payer: Medicaid Other | Admitting: Gastroenterology

## 2012-09-09 ENCOUNTER — Telehealth: Payer: Self-pay | Admitting: Gastroenterology

## 2012-09-09 ENCOUNTER — Encounter: Payer: Self-pay | Admitting: Gastroenterology

## 2012-09-09 VITALS — BP 100/80 | HR 101 | Ht 66.0 in | Wt 143.6 lb

## 2012-09-09 DIAGNOSIS — K920 Hematemesis: Secondary | ICD-10-CM

## 2012-09-09 DIAGNOSIS — R1013 Epigastric pain: Secondary | ICD-10-CM

## 2012-09-09 DIAGNOSIS — K219 Gastro-esophageal reflux disease without esophagitis: Secondary | ICD-10-CM

## 2012-09-09 DIAGNOSIS — G8929 Other chronic pain: Secondary | ICD-10-CM

## 2012-09-09 DIAGNOSIS — R197 Diarrhea, unspecified: Secondary | ICD-10-CM

## 2012-09-09 DIAGNOSIS — Z8719 Personal history of other diseases of the digestive system: Secondary | ICD-10-CM

## 2012-09-09 DIAGNOSIS — R635 Abnormal weight gain: Secondary | ICD-10-CM

## 2012-09-09 DIAGNOSIS — F411 Generalized anxiety disorder: Secondary | ICD-10-CM

## 2012-09-09 LAB — COMPREHENSIVE METABOLIC PANEL
Albumin: 3.8 g/dL (ref 3.5–5.2)
Alkaline Phosphatase: 47 U/L (ref 39–117)
CO2: 21 mEq/L (ref 19–32)
GFR: 53.52 mL/min — ABNORMAL LOW (ref >60.00)
Glucose, Bld: 93 mg/dL (ref 70–99)
Potassium: 3.4 mEq/L — ABNORMAL LOW (ref 3.5–5.1)
Sodium: 138 mEq/L (ref 135–145)
Total Protein: 7 g/dL (ref 6.0–8.3)

## 2012-09-09 LAB — HEPATIC FUNCTION PANEL
ALT: 13 U/L (ref 0–35)
AST: 29 U/L (ref 0–37)
Albumin: 3.8 g/dL (ref 3.5–5.2)

## 2012-09-09 LAB — CBC WITH DIFFERENTIAL/PLATELET
Eosinophils Relative: 2.8 % (ref 0.0–5.0)
Lymphocytes Relative: 17.6 % (ref 12.0–46.0)
Monocytes Relative: 3.9 % (ref 3.0–12.0)
Neutrophils Relative %: 75.2 % (ref 43.0–77.0)
Platelets: 317 10*3/uL (ref 150.0–400.0)
WBC: 5.4 10*3/uL (ref 4.5–10.5)

## 2012-09-09 LAB — VITAMIN B12: Vitamin B-12: 193 pg/mL — ABNORMAL LOW (ref 211–911)

## 2012-09-09 LAB — AMYLASE: Amylase: 55 U/L (ref 27–131)

## 2012-09-09 LAB — SEDIMENTATION RATE: Sed Rate: 2 mm/hr (ref 0–22)

## 2012-09-09 LAB — IBC PANEL: Saturation Ratios: 27.1 % (ref 20.0–50.0)

## 2012-09-09 MED ORDER — ONDANSETRON HCL 4 MG PO TABS: 8.0000 mg | ORAL_TABLET | Freq: Three times a day (TID) | ORAL | Status: DC | PRN

## 2012-09-09 MED ORDER — HYDROCODONE-ACETAMINOPHEN 5-325 MG PO TABS: 1.0000 | ORAL_TABLET | Freq: Four times a day (QID) | ORAL | Status: DC | PRN

## 2012-09-09 MED ORDER — SUCRALFATE 1 GM/10ML PO SUSP: 1.0000 g | Freq: Four times a day (QID) | ORAL | Status: DC

## 2012-09-09 MED ORDER — DIPHENOXYLATE-ATROPINE 2.5-0.025 MG PO TABS: 1.0000 | ORAL_TABLET | Freq: Three times a day (TID) | ORAL | Status: DC | PRN

## 2012-09-09 NOTE — Patient Instructions (Signed)
You have been scheduled for an endoscopy and colonoscopy with propofol. Please follow the written instructions given to you at your visit today. Please pick up your prep at the pharmacy within the next 1-3 days. If you use inhalers (even only as needed), please bring them with you on the day of your procedure. Your physician has requested that you go to www.startemmi.com and enter the access code given to you at your visit today. This web site gives a general overview about your procedure. However, you should still follow specific instructions given to you by our office regarding your preparation for the procedure.  We have sent the following medications to your pharmacy for you to pick up at your convenience: Lomotil please take three times daily as needed Carafate please take one tablespoon before meals and at bedtime  Your physician has requested that you go to the basement for lab work before leaving today.

## 2012-09-09 NOTE — Telephone Encounter (Signed)
PER DR. PATTERSON OK TO FILL ZOFRAN. PATIENT NOTIFIED

## 2012-09-09 NOTE — Progress Notes (Signed)
History of Present Illness:  This is a 42 year old Caucasian female with a long history of irritable bowel syndrome, GERD, and also previous acute duodenal ulcer seen at the time of endoscopy in 2012.Peggye Form seen her for many years and have treated her for anxiety, depression, and she now appears to have recurrebt supraventricular tachycardia and is been evaluated by cardiology with planned 2-D echocardiogram and Holter monitor..  Currently she is Toprol XL 25 mg a day, but continues with shortness of breath, palpitations, and atypical chest pain.  She continue her GERD symptoms despite Nexium 40 mg a day.  She's gained 37 pounds in weight over the last 6 months perhaps related to Effexor 75 mg a day per primary care for depression relapse...  She also continues to take Ativan 1 mg 3 times a day.  Other complaints include really diarrhea with crampy lower abdominal pain, and apparently she uses 6-10 Imodium tablets a day.  She vehemently denies antibiotic use or infectious disease exposure.  She denies a specific food intolerances, and denies use of sorbitol or fructose.  Patient also continues to complain of intermittent nausea and vomiting and hematemesis.  There is no history of alcohol, cigarette, or NSAID abuse.  Colonoscopy in 2008 showed mild rectal proctitis and perhaps solitary rectal ulcer syndrome.  She currently denies fever, chills, recurrent skin rashes, arthritis, mouth sores.  Other systemic complaints.   I have reviewed this patient's present history, medical and surgical past history, allergies and medications.     ROS:   All systems were reviewed and are negative unless otherwise stated in the HPI.    Physical Exam: Healthy-appearing patient in no acute distress.  Blood pressure 100/80, pulse 101, and weight under and 43 with a BMI of 23.19.  98% oxygen saturation on room air. General well developed well nourished patient in no acute distress, appearing their stated age Eyes PERRLA, no  icterus, fundoscopic exam per opthamologist Skin no lesions noted Neck supple, no adenopathy, no thyroid enlargement, no tenderness Chest clear to percussion and auscultation Heart no significant murmurs, gallops or rubs noted Abdomen no hepatosplenomegaly masses or tenderness, BS normal.  Rectal inspection normal no fissures, or fistulae noted.  No masses or tenderness on digital exam. Stool guaiac negative. Extremities no acute joint lesions, edema, phlebitis or evidence of cellulitis. Neurologic patient oriented x 3, cranial nerves intact, no focal neurologic deficits noted. Psychological mental status normal and normal affect.  Assessment and plan: I'm concerned with this patient's present severe acute and worsening upper and lower GI complaints.  She has a long history of IBS, but also has had peptic ulcer disease, GERD, proctitis, and may have underlying IBD.  I've ordered repeat labs, and we'll proceed with endoscopy to evaluate her hematemesis, also colonoscopy with random colon biopsies, and we'll evaluate her for possible C. difficile infection.  I have changed her to Lomotil 2.5 mg 3 times a day as needed for diarrhea with hydrocodone 5 mg every 6-8 hours for pain and diarrhea, added Carafate 1 g 3 times a day after meals, and have urged her to continue with her cardiology evaluation.  Serum gastrin level also ordered lab tests,  TSH level also requested.  If there is no other cause for her severe diarrhea, will probably need to stop her Nexium, also possibly Effexor.   Please copy her primary care physician, referring physician, and pertinent subspecialists. Encounter Diagnoses  Name Primary?  . Diarrhea Yes  . Vomiting blood

## 2012-09-12 ENCOUNTER — Telehealth: Payer: Self-pay | Admitting: *Deleted

## 2012-09-12 NOTE — Telephone Encounter (Signed)
We have discovered the following two patients who are being w/u for chest pain but appear to not have completed their cardiac w/u. They have been scheduled for EGD's. We will not be comfortable caring for them until they have been cleared by cardiology. Brandi Santiago [id number]  This was sent by Cathlyn Parsons in the [facility].  Spoke with pt to inform her we may need to postpone or r/s her ECL until she has received cardiac clearance. Pt stated understanding and states she should have that by Friday, 09/16/12. She will call me Friday and let me know if she needs to r/s.

## 2012-09-14 ENCOUNTER — Other Ambulatory Visit (HOSPITAL_COMMUNITY): Payer: Self-pay

## 2012-09-14 ENCOUNTER — Telehealth: Payer: Self-pay | Admitting: *Deleted

## 2012-09-14 NOTE — Telephone Encounter (Signed)
09/14/12 Pt No Show for monitor appt. TK

## 2012-09-15 ENCOUNTER — Other Ambulatory Visit: Payer: Self-pay | Admitting: *Deleted

## 2012-09-15 DIAGNOSIS — R197 Diarrhea, unspecified: Secondary | ICD-10-CM

## 2012-09-15 DIAGNOSIS — K920 Hematemesis: Secondary | ICD-10-CM

## 2012-09-15 DIAGNOSIS — K219 Gastro-esophageal reflux disease without esophagitis: Secondary | ICD-10-CM

## 2012-09-15 DIAGNOSIS — R1013 Epigastric pain: Secondary | ICD-10-CM

## 2012-09-15 MED ORDER — MOVIPREP 100 G PO SOLR: 1.0000 | Freq: Once | ORAL | Status: DC

## 2012-09-16 ENCOUNTER — Telehealth: Payer: Self-pay | Admitting: Cardiology

## 2012-09-16 NOTE — Telephone Encounter (Signed)
New problem   Pt is having colonoscopy Monday at 2pm and per Dr Jarold Motto pt need a clearance from Dr Jens Som by 5pm today. Please call pt

## 2012-09-16 NOTE — Telephone Encounter (Signed)
lmom for pt to call back. Cathlyn Parsons, CRNA still states pt cannot be done in the [facility] w/o cardiac clearance. Dr Jarold Motto had me ask Dr Jens Som for  cllearance; sent him a staff message asking for clearance. Pt called back at 4:21pm and I asked why she cancelled her cardiac procedures and she stated she had a family emergency. Told her of the situation and not to prep for the procedure on Monday. She asked me to call the doctor and I stated I sent him a message; I then asked her to call and she stated she will.

## 2012-09-16 NOTE — Telephone Encounter (Signed)
Spoke with pt, aware clearance is waiting for dr Jens Som review.

## 2012-09-16 NOTE — Telephone Encounter (Signed)
Pt was a NS for part of cardiac work up- treadmill. I spoke with Dr Jarold Motto and he will do Conscious Sedation on the pt rather than MAC. Left a Staff Message with Delice Bison at Cardiology noted the NS in the chart. Left a message with Cathlyn Parsons to call me back.

## 2012-09-19 ENCOUNTER — Other Ambulatory Visit: Payer: Self-pay | Admitting: Gastroenterology

## 2012-09-19 ENCOUNTER — Encounter: Payer: Self-pay | Admitting: Cardiology

## 2012-09-19 NOTE — Telephone Encounter (Signed)
I will clear once studies I ordered are complete.   Brandi Santiago      ----- Message -----   From: Linna Hoff, RN   Sent: 09/16/2012 3:52 PM   To: Lewayne Bunting, MD      This pt was a NS for her Stress Test and ECHO; will you clear her cardiac wise for her COLONOSCOPY? Thanks, I am the messenger, please don't shoot me! Thanks, Graciella Freer, RN for Dr Sheryn Bison ext [phone] or [phone]        Informed pt we cannot do her procedure until she has cardiac clearance. She stated she had spoken to Cardiology on 09/15/12 and the ofc informed her Dr Jens Som was unavailable. Encouraged pt to r/s her ECHO and Stress test; she stated understanding.

## 2012-09-20 ENCOUNTER — Telehealth: Payer: Self-pay | Admitting: Gastroenterology

## 2012-09-20 NOTE — Telephone Encounter (Signed)
Per dr Jens Som note to GI, pt will need testing done prior to clearance.

## 2012-09-20 NOTE — Telephone Encounter (Signed)
Do not reschedule her for any procedures or refill any of her medications we will not do anything until she gets her cardiac clearance completed. I may need to discharge her as a patient   ----- Message -----   From: Linna Hoff, RN   Sent: 09/19/2012 8:31 AM   To: Lewayne Bunting, MD, Mardella Layman, MD      Thank you so much! I completely understand!!!   ----- Message -----   From: Lewayne Bunting, MD   Sent: 09/17/2012 7:19 AM   To: Linna Hoff, RN      I will clear once studies I ordered are complete.   Olga Millers      ----- Message -----   From: Linna Hoff, RN   Sent: 09/16/2012 3:52 PM   To: Lewayne Bunting, MD      This pt was a NS for her Stress Test and ECHO; will you clear her cardiac wise for her COLONOSCOPY? Thanks, I am the messenger, please don't shoot me! Thanks, Graciella Freer, RN for Dr Sheryn Bison ext [phone] or [phone]                           Informed pt she needs an OV per Dr Jarold Motto; she will call back to schedule d/t she was driving.

## 2012-09-22 ENCOUNTER — Ambulatory Visit (HOSPITAL_COMMUNITY): Payer: Medicaid Other | Attending: Cardiology

## 2012-09-22 DIAGNOSIS — I079 Rheumatic tricuspid valve disease, unspecified: Secondary | ICD-10-CM | POA: Insufficient documentation

## 2012-09-22 DIAGNOSIS — R000 Tachycardia, unspecified: Secondary | ICD-10-CM | POA: Insufficient documentation

## 2012-09-22 DIAGNOSIS — I059 Rheumatic mitral valve disease, unspecified: Secondary | ICD-10-CM | POA: Insufficient documentation

## 2012-09-22 DIAGNOSIS — I379 Nonrheumatic pulmonary valve disorder, unspecified: Secondary | ICD-10-CM | POA: Insufficient documentation

## 2012-09-22 DIAGNOSIS — M069 Rheumatoid arthritis, unspecified: Secondary | ICD-10-CM | POA: Insufficient documentation

## 2012-09-22 DIAGNOSIS — M329 Systemic lupus erythematosus, unspecified: Secondary | ICD-10-CM | POA: Insufficient documentation

## 2012-09-22 DIAGNOSIS — IMO0001 Reserved for inherently not codable concepts without codable children: Secondary | ICD-10-CM | POA: Insufficient documentation

## 2012-09-22 DIAGNOSIS — R072 Precordial pain: Secondary | ICD-10-CM

## 2012-09-22 DIAGNOSIS — G35 Multiple sclerosis: Secondary | ICD-10-CM | POA: Insufficient documentation

## 2012-09-22 LAB — CELIAC PANEL 10
Gliadin IgG: 11.1 U/mL (ref <20)
IgA: 401 mg/dL — ABNORMAL HIGH (ref 69–380)

## 2012-09-22 NOTE — Progress Notes (Signed)
Echocardiogram performed.  

## 2012-09-27 ENCOUNTER — Encounter (INDEPENDENT_AMBULATORY_CARE_PROVIDER_SITE_OTHER): Payer: Medicaid Other

## 2012-09-27 ENCOUNTER — Encounter: Payer: Self-pay | Admitting: *Deleted

## 2012-09-27 DIAGNOSIS — R000 Tachycardia, unspecified: Secondary | ICD-10-CM

## 2012-09-27 NOTE — Progress Notes (Unsigned)
Patient ID: Brandi Santiago, female   DOB: 01-31-1971, 42 y.o.   MRN: [id number] 48 Hour Holter monitor placed on patient.

## 2012-09-29 NOTE — Telephone Encounter (Signed)
Pt has not called back.

## 2012-10-05 ENCOUNTER — Telehealth: Payer: Self-pay | Admitting: *Deleted

## 2012-10-05 NOTE — Telephone Encounter (Signed)
[facility] Pharmacy Prior Authorization line.   Did PA for Nexium 40 mg, 1 cap daily. One year approval: PA  # [id number] P.  Prescriber [name] Esterwood PA-C.  [facility] [address] [facility] [address] [phone].

## 2012-10-09 ENCOUNTER — Emergency Department (HOSPITAL_BASED_OUTPATIENT_CLINIC_OR_DEPARTMENT_OTHER): Payer: BC Managed Care – PPO

## 2012-10-09 ENCOUNTER — Encounter (HOSPITAL_BASED_OUTPATIENT_CLINIC_OR_DEPARTMENT_OTHER): Payer: Self-pay | Admitting: *Deleted

## 2012-10-09 ENCOUNTER — Emergency Department (HOSPITAL_BASED_OUTPATIENT_CLINIC_OR_DEPARTMENT_OTHER)
Admission: EM | Admit: 2012-10-09 | Discharge: 2012-10-09 | Disposition: A | Discharge status: Home / Self Care | Payer: BC Managed Care – PPO | Attending: Emergency Medicine | Admitting: Emergency Medicine

## 2012-10-09 DIAGNOSIS — IMO0001 Reserved for inherently not codable concepts without codable children: Secondary | ICD-10-CM | POA: Insufficient documentation

## 2012-10-09 DIAGNOSIS — S22000A Wedge compression fracture of unspecified thoracic vertebra, initial encounter for closed fracture: Secondary | ICD-10-CM

## 2012-10-09 DIAGNOSIS — Z8739 Personal history of other diseases of the musculoskeletal system and connective tissue: Secondary | ICD-10-CM | POA: Insufficient documentation

## 2012-10-09 DIAGNOSIS — Z87440 Personal history of urinary (tract) infections: Secondary | ICD-10-CM | POA: Insufficient documentation

## 2012-10-09 DIAGNOSIS — Z8719 Personal history of other diseases of the digestive system: Secondary | ICD-10-CM | POA: Insufficient documentation

## 2012-10-09 DIAGNOSIS — S199XXA Unspecified injury of neck, initial encounter: Secondary | ICD-10-CM | POA: Insufficient documentation

## 2012-10-09 DIAGNOSIS — S298XXA Other specified injuries of thorax, initial encounter: Secondary | ICD-10-CM | POA: Insufficient documentation

## 2012-10-09 DIAGNOSIS — Y9289 Other specified places as the place of occurrence of the external cause: Secondary | ICD-10-CM | POA: Insufficient documentation

## 2012-10-09 DIAGNOSIS — Z8781 Personal history of (healed) traumatic fracture: Secondary | ICD-10-CM | POA: Insufficient documentation

## 2012-10-09 DIAGNOSIS — F3289 Other specified depressive episodes: Secondary | ICD-10-CM | POA: Insufficient documentation

## 2012-10-09 DIAGNOSIS — K219 Gastro-esophageal reflux disease without esophagitis: Secondary | ICD-10-CM | POA: Insufficient documentation

## 2012-10-09 DIAGNOSIS — F329 Major depressive disorder, single episode, unspecified: Secondary | ICD-10-CM | POA: Insufficient documentation

## 2012-10-09 DIAGNOSIS — S0993XA Unspecified injury of face, initial encounter: Secondary | ICD-10-CM | POA: Insufficient documentation

## 2012-10-09 DIAGNOSIS — F411 Generalized anxiety disorder: Secondary | ICD-10-CM | POA: Insufficient documentation

## 2012-10-09 DIAGNOSIS — Z8669 Personal history of other diseases of the nervous system and sense organs: Secondary | ICD-10-CM | POA: Insufficient documentation

## 2012-10-09 DIAGNOSIS — S22009A Unspecified fracture of unspecified thoracic vertebra, initial encounter for closed fracture: Secondary | ICD-10-CM | POA: Insufficient documentation

## 2012-10-09 DIAGNOSIS — Y9301 Activity, walking, marching and hiking: Secondary | ICD-10-CM | POA: Insufficient documentation

## 2012-10-09 DIAGNOSIS — M069 Rheumatoid arthritis, unspecified: Secondary | ICD-10-CM | POA: Insufficient documentation

## 2012-10-09 DIAGNOSIS — Z79899 Other long term (current) drug therapy: Secondary | ICD-10-CM | POA: Insufficient documentation

## 2012-10-09 DIAGNOSIS — W108XXA Fall (on) (from) other stairs and steps, initial encounter: Secondary | ICD-10-CM | POA: Insufficient documentation

## 2012-10-09 DIAGNOSIS — D509 Iron deficiency anemia, unspecified: Secondary | ICD-10-CM | POA: Insufficient documentation

## 2012-10-09 MED ORDER — KETOROLAC TROMETHAMINE 60 MG/2ML IM SOLN
60.0000 mg | Freq: Once | INTRAMUSCULAR | Status: AC
  Administered 2012-10-09: 60 mg via INTRAMUSCULAR
  Filled 2012-10-09: qty 2

## 2012-10-09 MED ORDER — OXYCODONE-ACETAMINOPHEN 5-325 MG PO TABS: 2.0000 | ORAL_TABLET | ORAL | Status: DC | PRN

## 2012-10-09 MED ORDER — PROMETHAZINE HCL 25 MG/ML IJ SOLN
25.0000 mg | Freq: Once | INTRAMUSCULAR | Status: AC
  Administered 2012-10-09: 25 mg via INTRAMUSCULAR
  Filled 2012-10-09: qty 1

## 2012-10-09 MED ORDER — HYDROMORPHONE HCL PF 1 MG/ML IJ SOLN
1.0000 mg | Freq: Once | INTRAMUSCULAR | Status: AC
  Administered 2012-10-09: 1 mg via INTRAMUSCULAR
  Filled 2012-10-09: qty 1

## 2012-10-09 NOTE — ED Notes (Signed)
Patient was given her d/c papers but requested to be given percocet 10-500 instead of 5-325. No changes were made to her medication prescription and the patient said that she'd call tomorrow if it isn't strong enough. Informed the patient that she could see her PCP, be re-seen here, but that we would not be able to do anything for her over the phone.

## 2012-10-09 NOTE — ED Provider Notes (Signed)
History  This chart was scribed for Gilda Crease, MD by Ardelia Mems, ED Scribe. This patient was seen in room MH05/MH05 and the patient's care was started at 5:25 PM.   CSN: [id number]  Arrival date & time 10/09/12  1556     Chief Complaint  Patient presents with  . Fall    The history is provided by the patient. No language interpreter was used.   HPI Comments: Brandi Santiago is a 42 y.o. female who presents to the Emergency Department complaining of constant, moderate chest, neck and back pain onset after a fall that occurred earlier today. Pt states that she is in the process of moving and was walking down steps carrying a tumbler, and that she accidentally rolled and fell down concrete steps. She states that she has a h/o back problems and that her current back pain is in her upper middle back. She states that her chest pain is central and non-radiating. She denies head injury, LOC, numbness, tingling of extremities, fever, vomiting, diarrhea or any other symptoms.  Past Medical History  Diagnosis Date  . Irritable bowel syndrome   . Depressive disorder, not elsewhere classified   . Anxiety state, unspecified   . Esophageal reflux   . Ulcerative (chronic) proctitis   . MS (multiple sclerosis)   . Barrett esophagus   . Allergy   . Anemia   . Hiatal hernia   . Iron deficiency anemia, unspecified   . Kidney infection   . Lupus   . RA (rheumatoid arthritis)   . Tachycardia   . Fibromyalgia     Past Surgical History  Procedure Laterality Date  . Nasal sinus surgery    . Mandible fracture surgery    . Appendectomy    . Cesarean section    . Partial hysterectomy      Family History  Problem Relation Age of Onset  . Colon cancer Neg Hx   . Diabetes Paternal Grandmother   . Diabetes Paternal Grandfather   . Clotting disorder Father     History  Substance Use Topics  . Smoking status: Never Smoker   . Smokeless tobacco: Never Used  . Alcohol Use: Yes      Comment: rare    OB History   Grav Para Term Preterm Abortions TAB SAB Ect Mult Living                  Review of Systems  Constitutional: Negative for fever and chills.  HENT: Positive for neck pain.   Cardiovascular: Positive for chest pain.  Gastrointestinal: Negative for nausea, vomiting and diarrhea.  Musculoskeletal: Positive for back pain.  Neurological: Negative for syncope, weakness and numbness.  All other systems reviewed and are negative.   A complete 10 system review of systems was obtained and all systems are negative except as noted in the HPI and PMH.   Allergies  Review of patient's allergies indicates no known allergies.  Home Medications   Current Outpatient Rx  Name  Route  Sig  Dispense  Refill  . diphenoxylate-atropine (LOMOTIL) 2.5-0.025 MG per tablet   Oral   Take 1 tablet by mouth 3 (three) times daily as needed for diarrhea or loose stools.   30 tablet   1   . esomeprazole (NEXIUM) 40 MG capsule      Take 1 cap once daily before breakfast.   90 capsule   3   . FOLIC ACID PO   Oral   Take  1 tablet by mouth daily.         Marland Kitchen HYDROcodone-acetaminophen (NORCO/VICODIN) 5-325 MG per tablet   Oral   Take 1 tablet by mouth every 6 (six) hours as needed for pain.   30 tablet   0   . LORazepam (ATIVAN) 1 MG tablet   Oral   Take 1 tablet (1 mg total) by mouth 3 (three) times daily as needed for anxiety.   40 tablet   0   . metoprolol succinate (TOPROL-XL) 25 MG 24 hr tablet   Oral   Take 25 mg by mouth 2 (two) times daily.          Marland Kitchen MOVIPREP 100 G SOLR   Oral   Take 1 kit (100 g total) by mouth once.   1 kit   0     Dispense as written.   . ondansetron (ZOFRAN) 4 MG tablet   Oral   Take 2 tablets (8 mg total) by mouth every 8 (eight) hours as needed for nausea.   30 tablet   1   . Probiotic Product (PROBIOTIC DAILY PO)   Oral   Take 1 tablet by mouth daily.         . sucralfate (CARAFATE) 1 GM/10ML suspension    Oral   Take 10 mLs (1 g total) by mouth 4 (four) times daily.   420 mL   1   . topiramate (TOPAMAX) 100 MG tablet   Oral   Take 25 mg by mouth 3 (three) times daily.          Marland Kitchen venlafaxine (EFFEXOR) 75 MG tablet   Oral   Take 75 mg by mouth 2 (two) times daily.           Triage Vitals: BP 129/99  Pulse 94  Temp(Src) 97.9 F (36.6 C) (Oral)  Resp 18  Ht 5\' 6"  (1.676 m)  Wt 140 lb (63.504 kg)  BMI 22.61 kg/m2  SpO2 100%  Physical Exam  Constitutional: She is oriented to person, place, and time. She appears well-developed and well-nourished. No distress.  HENT:  Head: Normocephalic and atraumatic.  Right Ear: Hearing normal.  Left Ear: Hearing normal.  Nose: Nose normal.  Mouth/Throat: Oropharynx is clear and moist and mucous membranes are normal.  Eyes: Conjunctivae and EOM are normal. Pupils are equal, round, and reactive to light.  Neck: Normal range of motion. Neck supple.  Cardiovascular: Regular rhythm, S1 normal and S2 normal.  Exam reveals no gallop and no friction rub.   No murmur heard. Pulmonary/Chest: Effort normal and breath sounds normal. No respiratory distress. She exhibits no tenderness.    Abdominal: Soft. Normal appearance and bowel sounds are normal. There is no hepatosplenomegaly. There is no tenderness. There is no rebound, no guarding, no tenderness at McBurney's point and negative Murphy's sign. No hernia.  Musculoskeletal: Normal range of motion.       Thoracic back: She exhibits tenderness.       Lumbar back: She exhibits tenderness.  Neurological: She is alert and oriented to person, place, and time. She has normal strength. No cranial nerve deficit or sensory deficit. Coordination normal. GCS eye subscore is 4. GCS verbal subscore is 5. GCS motor subscore is 6.  Skin: Skin is warm, dry and intact. No rash noted. No cyanosis.  Psychiatric: She has a normal mood and affect. Her speech is normal and behavior is normal. Thought content normal.     ED Course  Procedures (including critical  care time)  DIAGNOSTIC STUDIES: Oxygen Saturation is 100% on RA, normal by my interpretation.    COORDINATION OF CARE: 5:29 PM- Pt advised of plan for treatment and pt agrees.   Medications  ketorolac (TORADOL) injection 60 mg (60 mg Intramuscular Given 10/09/12 1829)     Labs Reviewed - No data to display Dg Chest 2 View  10/09/2012   *RADIOLOGY REPORT*  Clinical Data: Fall.  Right-sided chest pain with inspiration.  CHEST - 2 VIEW  Comparison: 05/23/2012.  Findings:  Cardiopericardial silhouette within normal limits. Mediastinal contours normal. Trachea midline.  No airspace disease or effusion. No pneumothorax.  Thoracic spine appears unchanged.  IMPRESSION: No active cardiopulmonary disease.   Original Report Authenticated By: Andreas Newport, M.D.   Dg Cervical Spine Complete  10/09/2012   *RADIOLOGY REPORT*  Clinical Data: Fall.  Neck pain.  CERVICAL SPINE - COMPLETE 4+ VIEW  Comparison: CT 03/18/2009.  Findings: Anatomic alignment.  No fracture.  Soft tissues appear within normal limits.  The mandibular screws are present. Craniocervical junction and cervicothoracic junction visualized and normal.  Prevertebral soft tissues normal.  IMPRESSION: Negative.   Original Report Authenticated By: Andreas Newport, M.D.   Dg Thoracic Spine 2 View  10/09/2012   *RADIOLOGY REPORT*  Clinical Data: Fall.  Back pain.  THORACIC SPINE - 2 VIEW  Comparison: 05/23/2012.  Findings: There appears to be depression of the superior T7 endplate, with cortical irregularity anteriorly.  Findings suggest an acute or subacute compression fracture in this patient with history of recent trauma.  Other vertebral bodies appear within normal limits.  There is no radiographic evidence of retropulsion.  Vertebral body height loss is less than 25%. On the frontal view, there may be some thickening of the paraspinal line on the left.  IMPRESSION: Findings suspicious for T7 mild  compression fracture with loss vertebral body height less than 25% and no retropulsion. MRI may be useful in further evaluation to confirm the fracture and evaluate the age.   Original Report Authenticated By: Andreas Newport, M.D.   Dg Lumbar Spine Complete  10/09/2012   *RADIOLOGY REPORT*  Clinical Data: Fall.  Back pain.  LUMBAR SPINE - COMPLETE 4+ VIEW  Comparison: 11/25/2011.  Findings: No fracture.  Mild dextroconvex thoracolumbar curvature appears similar to the prior CT.  Vertebral body height is preserved.  No fracture.  Disc spaces are also preserved. Lumbosacral junction normal.  IMPRESSION: No acute osseous abnormality.   Original Report Authenticated By: Andreas Newport, M.D.   Ct Thoracic Spine Wo Contrast  10/09/2012   *RADIOLOGY REPORT*  Clinical Data: T7 compression fracture.  CT THORACIC SPINE WITHOUT CONTRAST  Technique:  Multidetector CT imaging of the thoracic spine was performed without intravenous contrast administration. Multiplanar CT image reconstructions were also generated  Comparison: Radiographs today.  No process of all imaging comparison.  Findings: Age indeterminant anterior compression fractures of T7 and T9.  T9 demonstrates a superior endplate compression fracture with less than 25% loss of vertebral body height.  T7 shows biconcave compression fractures, more prominent along the superior endplate.  The T7 compression fracture is more convincing for acute fracture based on end plate sclerosis suggesting impaction. Scattered Schmorl's nodes are present at other levels.  There is no retropulsion at either level.  Mild levoconvex curvature.  Facet joints appear within normal limits.  No epidural hematoma.  Sternum appears intact.  Visualized lungs show dependent atelectasis. Central airway is patent.  IMPRESSION: Age indeterminant T7 and T9 compression fractures with less  than 25% loss vertebral body height and no retropulsion.  The T7 fracture has features that suggest acute or  subacute time course while the T9 fracture remains indeterminant. T9 and may well represent a Schmorl's node as the disc is degenerated and vacuum disc is present at T8-T9.  MRI was suggested previously which would allow for assessment of marrow edema to determine the acuity of the fractures.   Original Report Authenticated By: Andreas Newport, M.D.     Diagnosis: 1. Multiple contusions secondary to fall 2. T7 and T9 compression fractures, age indeterminate    MDM  The patient presents to the ER after a fall. She is complaining of pain in the back mostly. She had some tenderness across her upper chest as well. There is no difficulty breathing. Patient does not have abdominal tenderness.  The patient refused cervical collar. Patient was very demanding for pain medication.  X-ray of chest and cervical spine did not show any evidence of acute fracture. Thoracic x-rays did show possible T7 compression fracture. MRI not available, so CT scan was performed. There is no retropulsion. There is evidence for compression of T7 and T9, age is indeterminate. Patient does have back pain after fall and it is reasonable to consider that at least one of these may be new. She does not have any neurologic findings and does not require any emergent intervention. She was treated with analgesia and is to follow up with her doctor tomorrow for ongoing chronic pain management and referrals if necessary.       I personally performed the services described in this documentation, which was scribed in my presence. The recorded information has been reviewed and is accurate.     Gilda Crease, MD 10/09/12 [phone]

## 2012-10-09 NOTE — ED Notes (Signed)
Pt keeps saying we are slow. She just needs some pain  Medication. Pt placed in room and MD already at bedside.

## 2012-10-09 NOTE — ED Notes (Signed)
Pt states she was carrying a tumbler while walking down the steps and fell. Now C/O neck, chest and back pain. Denies numbness/tingling to ext. C-Collar applied at triage.

## 2012-10-09 NOTE — ED Notes (Signed)
MD at bedside. Dr. Pollina. 

## 2012-10-11 ENCOUNTER — Telehealth: Payer: Self-pay | Admitting: Cardiology

## 2012-10-11 NOTE — Telephone Encounter (Signed)
Left pt a message to call back. The only labs results on pt's records are the ones order by Dr Jarold Motto Laurette Schimke) on 09/09/12.

## 2012-10-11 NOTE — Telephone Encounter (Signed)
New problem ° ° ° °Pt calling for lab results °

## 2012-10-11 NOTE — Telephone Encounter (Signed)
Pt called back ,she wants the Monitor and Echo results. Echo results given . The monitor report read per Dr. Patty Sermons DOD: "Sinus tachycardia Max rate 173 beats/minute noted at 0940 ?" no PAC's or PVC . Pt aware Pt C/O of having chest pain and is not relieved by Tylenol > an appointment was made for pt with Jacolyn Reedy PA tomorrow 10/12/12 at 8:30 AM. Pt aware.

## 2012-10-12 ENCOUNTER — Encounter: Payer: Medicaid Other | Admitting: Physician Assistant

## 2012-10-12 NOTE — Assessment & Plan Note (Signed)
Patient is a no show

## 2012-10-12 NOTE — Progress Notes (Signed)
Patient ID: Brandi Santiago, female   DOB: 1970-07-31, 42 y.o.   MRN: [id number]

## 2012-10-23 ENCOUNTER — Emergency Department (INDEPENDENT_AMBULATORY_CARE_PROVIDER_SITE_OTHER)
Admission: EM | Admit: 2012-10-23 | Discharge: 2012-10-23 | Disposition: A | Discharge status: Home / Self Care | Payer: Medicaid Other | Source: Home / Self Care

## 2012-10-23 ENCOUNTER — Encounter (HOSPITAL_COMMUNITY): Payer: Self-pay | Admitting: *Deleted

## 2012-10-23 DIAGNOSIS — T148XXA Other injury of unspecified body region, initial encounter: Secondary | ICD-10-CM

## 2012-10-23 DIAGNOSIS — R238 Other skin changes: Secondary | ICD-10-CM

## 2012-10-23 MED ORDER — KETOROLAC TROMETHAMINE 60 MG/2ML IM SOLN
60.0000 mg | Freq: Once | INTRAMUSCULAR | Status: AC
  Administered 2012-10-23: 60 mg via INTRAMUSCULAR

## 2012-10-23 MED ORDER — METHYLPREDNISOLONE ACETATE 40 MG/ML IJ SUSP
40.0000 mg | Freq: Once | INTRAMUSCULAR | Status: AC
  Administered 2012-10-23: 40 mg via INTRAMUSCULAR

## 2012-10-23 MED ORDER — METHYLPREDNISOLONE ACETATE 40 MG/ML IJ SUSP
INTRAMUSCULAR | Status: AC
  Filled 2012-10-23: qty 5

## 2012-10-23 MED ORDER — HYDROXYZINE HCL 50 MG/ML IM SOLN: 25.0000 mg | Freq: Once | INTRAMUSCULAR | Status: DC

## 2012-10-23 MED ORDER — DIPHENHYDRAMINE HCL 50 MG/ML IJ SOLN
INTRAMUSCULAR | Status: AC
  Filled 2012-10-23: qty 1

## 2012-10-23 MED ORDER — DIPHENHYDRAMINE HCL 50 MG/ML IJ SOLN
25.0000 mg | Freq: Once | INTRAMUSCULAR | Status: AC
  Administered 2012-10-23: 25 mg via INTRAMUSCULAR

## 2012-10-23 MED ORDER — KETOROLAC TROMETHAMINE 60 MG/2ML IM SOLN
INTRAMUSCULAR | Status: AC
  Filled 2012-10-23: qty 2

## 2012-10-23 NOTE — ED Notes (Signed)
Rash on forehead that burns, hurts and itches.  She said its from her lupus.  When she gets stressed it causes swelling-c/o swelling of her forehead, eyebrows up.

## 2012-10-23 NOTE — ED Provider Notes (Signed)
History     CSN: [id number]  Arrival date & time 10/23/12  1654   First MD Initiated Contact with Patient 10/23/12 1656      Chief Complaint  Patient presents with  . Lupus    (Consider location/radiation/quality/duration/timing/severity/associated sxs/prior treatment) HPI  42 yo female presents today with complaints of blisters on forehead.  States that she was treated here for this about 5 months ago and the medications that were given did great.  I reviewed that visit.  Patient states that she has lupus and has seen Dr Terri Piedra dermatologist for the blisters in the past.  He did a biopsy and referred her to [facility] but she has not gone yet.    Denies vision changes.  Blisters on forehead and around eyebrows are painful and pruritic.  Denies fever, chills.    Past Medical History  Diagnosis Date  . Irritable bowel syndrome   . Depressive disorder, not elsewhere classified   . Anxiety state, unspecified   . Esophageal reflux   . Ulcerative (chronic) proctitis   . MS (multiple sclerosis)   . Barrett esophagus   . Allergy   . Anemia   . Hiatal hernia   . Iron deficiency anemia, unspecified   . Kidney infection   . Lupus   . RA (rheumatoid arthritis)   . Tachycardia   . Fibromyalgia     Past Surgical History  Procedure Laterality Date  . Nasal sinus surgery    . Mandible fracture surgery    . Appendectomy    . Cesarean section    . Partial hysterectomy      Family History  Problem Relation Age of Onset  . Colon cancer Neg Hx   . Diabetes Paternal Grandmother   . Diabetes Paternal Grandfather   . Clotting disorder Father     History  Substance Use Topics  . Smoking status: Never Smoker   . Smokeless tobacco: Never Used  . Alcohol Use: Yes     Comment: rare    OB History   Grav Para Term Preterm Abortions TAB SAB Ect Mult Living                  Review of Systems  Constitutional: Negative.   HENT:       Blisters on forehead.   Eyes: Negative.    Respiratory: Negative.   Cardiovascular: Negative.   Genitourinary: Negative.   Neurological: Negative.     Allergies  Review of patient's allergies indicates no known allergies.  Home Medications   Current Outpatient Rx  Name  Route  Sig  Dispense  Refill  . diphenoxylate-atropine (LOMOTIL) 2.5-0.025 MG per tablet   Oral   Take 1 tablet by mouth 3 (three) times daily as needed for diarrhea or loose stools.   30 tablet   1   . esomeprazole (NEXIUM) 40 MG capsule      Take 1 cap once daily before breakfast.   90 capsule   3   . FOLIC ACID PO   Oral   Take 1 tablet by mouth daily.         Marland Kitchen HYDROcodone-acetaminophen (NORCO/VICODIN) 5-325 MG per tablet   Oral   Take 1 tablet by mouth every 6 (six) hours as needed for pain.   30 tablet   0   . LORazepam (ATIVAN) 1 MG tablet   Oral   Take 1 tablet (1 mg total) by mouth 3 (three) times daily as needed for anxiety.  40 tablet   0   . metoprolol succinate (TOPROL-XL) 25 MG 24 hr tablet   Oral   Take 25 mg by mouth 2 (two) times daily.          Marland Kitchen MOVIPREP 100 G SOLR   Oral   Take 1 kit (100 g total) by mouth once.   1 kit   0     Dispense as written.   . ondansetron (ZOFRAN) 4 MG tablet   Oral   Take 2 tablets (8 mg total) by mouth every 8 (eight) hours as needed for nausea.   30 tablet   1   . oxyCODONE-acetaminophen (PERCOCET) 5-325 MG per tablet   Oral   Take 2 tablets by mouth every 4 (four) hours as needed for pain.   10 tablet   0   . Probiotic Product (PROBIOTIC DAILY PO)   Oral   Take 1 tablet by mouth daily.         . sucralfate (CARAFATE) 1 GM/10ML suspension   Oral   Take 10 mLs (1 g total) by mouth 4 (four) times daily.   420 mL   1   . topiramate (TOPAMAX) 100 MG tablet   Oral   Take 25 mg by mouth 3 (three) times daily.          Marland Kitchen venlafaxine (EFFEXOR) 75 MG tablet   Oral   Take 75 mg by mouth 2 (two) times daily.           BP 143/101  Pulse 89  Temp(Src) 99.3 F  (37.4 C) (Oral)  Resp 19  SpO2 100%  Physical Exam  Constitutional: She appears well-developed and well-nourished.  HENT:  Head: Normocephalic and atraumatic.  Painful blisters on forehead around bilat eyebrows.  No drainage.  Does not affect the eyes.   Eyes: Pupils are equal, round, and reactive to light.  Neck: Normal range of motion. Neck supple.  Cardiovascular: Normal rate and regular rhythm.   Pulmonary/Chest: Effort normal and breath sounds normal.  Musculoskeletal: Normal range of motion.  Skin: Skin is warm and dry.  Psychiatric: She has a normal mood and affect.    ED Course  Procedures (including critical care time)  Labs Reviewed - No data to display No results found.   1. Blisters of multiple sites       MDM  Gave patient the same injections that she had ordered by dr Artis Flock Jan 2014.  Advised her to f/u with dr Terri Piedra this week and she must call his office tomorrow to sched appt.  She voices understanding.     Meds ordered this encounter  Medications  . hydrOXYzine (VISTARIL) injection 25 mg    Sig:   . methylPREDNISolone acetate (DEPO-MEDROL) injection 40 mg    Sig:   . ketorolac (TORADOL) injection 60 mg    Sig:   . buPROPion (WELLBUTRIN) 75 MG tablet    Sig: Take 75 mg by mouth 2 (two) times daily.        Zonia Kief, PA-C 10/23/12 1808

## 2012-10-24 ENCOUNTER — Telehealth: Payer: Self-pay | Admitting: Cardiology

## 2012-10-24 NOTE — Telephone Encounter (Signed)
Left message for pt to call.

## 2012-10-24 NOTE — Telephone Encounter (Signed)
Spoke with pt, echo results sent to PCP

## 2012-10-24 NOTE — ED Provider Notes (Signed)
Medical screening examination/treatment/procedure(s) were performed by resident physician or non-physician practitioner and as supervising physician I was immediately available for consultation/collaboration.   Jaiyla Granados DOUGLAS MD.   Logyn Kendrick D Abdulrahman Bracey, MD 10/24/12 1930 

## 2012-10-24 NOTE — Telephone Encounter (Signed)
New Problem  Pt is wanting you to call her PCP Attn Elpidio Anis, [phone])  and let them know that everything was fine so she can get her medication refilled.

## 2012-10-25 ENCOUNTER — Telehealth: Payer: Self-pay | Admitting: Cardiology

## 2012-10-25 NOTE — Telephone Encounter (Signed)
Spoke with pt, aware could cause palpitations worse. Pt voiced understanding and needs the med for school. Okay given to shantae for pt to use vyvanse.

## 2012-10-25 NOTE — Telephone Encounter (Signed)
New problem   Shontae/Regional Physician calling in referene to Holter Monitor and possible use of vyvanse. Please call

## 2012-10-25 NOTE — Telephone Encounter (Signed)
Ok to try vyvanse but may make palpitations and tachycardia worse Brandi Santiago

## 2012-10-25 NOTE — Telephone Encounter (Signed)
Spoke with shontae, they want to know if the pt is clear to use vyvanse. I let them know she has a follow up appt 11-07-12. Results of holter monitor including heart rate given. Will forward for dr Jens Som review

## 2012-11-07 ENCOUNTER — Encounter: Payer: Self-pay | Admitting: Cardiology

## 2012-11-07 ENCOUNTER — Ambulatory Visit (INDEPENDENT_AMBULATORY_CARE_PROVIDER_SITE_OTHER): Payer: Medicaid Other | Admitting: Cardiology

## 2012-11-07 ENCOUNTER — Telehealth: Payer: Self-pay | Admitting: *Deleted

## 2012-11-07 VITALS — BP 124/86 | HR 117 | Ht 66.0 in | Wt 146.1 lb

## 2012-11-07 DIAGNOSIS — R000 Tachycardia, unspecified: Secondary | ICD-10-CM

## 2012-11-07 DIAGNOSIS — R079 Chest pain, unspecified: Secondary | ICD-10-CM

## 2012-11-07 MED ORDER — ONDANSETRON HCL 4 MG PO TABS: 8.0000 mg | ORAL_TABLET | Freq: Three times a day (TID) | ORAL | Status: DC | PRN

## 2012-11-07 MED ORDER — METOPROLOL SUCCINATE ER 25 MG PO TB24: ORAL_TABLET | ORAL | Status: DC

## 2012-11-07 NOTE — Telephone Encounter (Signed)
Request to refill Zofran. Ok to refill?

## 2012-11-07 NOTE — Assessment & Plan Note (Signed)
Pain is not consistent with cardiac etiology. Echocardiogram normal. Followup primary care.

## 2012-11-07 NOTE — Assessment & Plan Note (Signed)
Etiology unclear; TSH normal; Hgb reasonable; echo shows normal LV function. Continue Toprol was increased to 75 mg daily.

## 2012-11-07 NOTE — Patient Instructions (Addendum)
Your physician wants you to follow-up in: 6 MONTHS WITH DR Jens Som You will receive a reminder letter in the mail two months in advance. If you don't receive a letter, please call our office to schedule the follow-up appointment.   INCREASE METOPROLOL TO 75 MG ONCE DAILY

## 2012-11-07 NOTE — Progress Notes (Signed)
HPI: Pleasant female for fu of palpitations. Patient seen in the emergency room in January of 2014 with chest pain. Chest x-ray showed no acute abnormalities. Electrocardiogram showed sinus rhythm with PACs and nonspecific ST changes. Troponin normal and d-dimer negative. Hemoglobin 10.3. Patient felt to be having an anxiety attack. Echocardiogram in May of 2014 showed normal LV function and grade 1 diastolic dysfunction. Holter monitor in May of 2014 showed sinus rhythm with sinus tachycardia. TSH in May of 2014 0.82; Hgb 12.9. Since she was last seen, there is no dyspnea or syncope. She continues to have chest pain which never resolved. She continues to feel palpitations particularly when she is stressed.    Current Outpatient Prescriptions  Medication Sig Dispense Refill  . buPROPion (WELLBUTRIN) 75 MG tablet Take 75 mg by mouth 2 (two) times daily.      . diphenoxylate-atropine (LOMOTIL) 2.5-0.025 MG per tablet Take 1 tablet by mouth 3 (three) times daily as needed for diarrhea or loose stools.  30 tablet  1  . esomeprazole (NEXIUM) 40 MG capsule Take 1 cap once daily before breakfast.  90 capsule  3  . FOLIC ACID PO Take 1 tablet by mouth daily.      Marland Kitchen LORazepam (ATIVAN) 1 MG tablet Take 1 tablet (1 mg total) by mouth 3 (three) times daily as needed for anxiety.  40 tablet  0  . metoprolol succinate (TOPROL-XL) 25 MG 24 hr tablet Take 25 mg by mouth 2 (two) times daily.       Marland Kitchen MOVIPREP 100 G SOLR Take 1 kit (100 g total) by mouth once.  1 kit  0  . ondansetron (ZOFRAN) 4 MG tablet Take 2 tablets (8 mg total) by mouth every 8 (eight) hours as needed for nausea.  30 tablet  1  . Probiotic Product (PROBIOTIC DAILY PO) Take 1 tablet by mouth daily.      . sucralfate (CARAFATE) 1 GM/10ML suspension Take 10 mLs (1 g total) by mouth 4 (four) times daily.  420 mL  1   No current facility-administered medications for this visit.     Past Medical History  Diagnosis Date  . Irritable bowel  syndrome   . Depressive disorder, not elsewhere classified   . Anxiety state, unspecified   . Esophageal reflux   . Ulcerative (chronic) proctitis   . MS (multiple sclerosis)   . Barrett esophagus   . Allergy   . Anemia   . Hiatal hernia   . Iron deficiency anemia, unspecified   . Kidney infection   . Lupus   . RA (rheumatoid arthritis)   . Tachycardia   . Fibromyalgia     Past Surgical History  Procedure Laterality Date  . Nasal sinus surgery    . Mandible fracture surgery    . Appendectomy    . Cesarean section    . Partial hysterectomy      History   Social History  . Marital Status: Divorced    Spouse Name: N/A    Number of Children: 2  . Years of Education: N/A   Occupational History  . Student    Social History Main Topics  . Smoking status: Never Smoker   . Smokeless tobacco: Never Used  . Alcohol Use: Yes     Comment: occasional  . Drug Use: No  . Sexually Active: Yes    Birth Control/ Protection: Surgical   Other Topics Concern  . Not on file   Social History Narrative  .  No narrative on file    ROS: no fevers or chills, productive cough, hemoptysis, dysphasia, odynophagia, melena, hematochezia, dysuria, hematuria, rash, seizure activity, orthopnea, PND, pedal edema, claudication. Remaining systems are negative.  Physical Exam: Well-developed well-nourished in no acute distress.  Skin is warm and dry.  HEENT is normal.  Neck is supple.  Chest is clear to auscultation with normal expansion.  Cardiovascular exam is regular rate and rhythm.  Abdominal exam nontender or distended. No masses palpated. Extremities show no edema. neuro grossly intact

## 2012-11-07 NOTE — Telephone Encounter (Signed)
No. She did complete her Cardiology appt at last. Write on script she must have an OV 1st. Thanks.

## 2012-11-07 NOTE — Assessment & Plan Note (Signed)
Patient has inquired about vyvanse; she does not have structural heart disease. I have explained that this could potentially cause increase in heart rate. Otherwise I see no reason that she needs to avoid these medications.

## 2012-11-11 ENCOUNTER — Telehealth: Payer: Self-pay | Admitting: Gastroenterology

## 2012-11-11 MED ORDER — ONDANSETRON HCL 4 MG PO TABS: 8.0000 mg | ORAL_TABLET | Freq: Three times a day (TID) | ORAL | Status: AC | PRN

## 2012-11-11 MED ORDER — SUCRALFATE 1 GM/10ML PO SUSP: 1.0000 g | Freq: Four times a day (QID) | ORAL | Status: DC

## 2012-11-11 NOTE — Telephone Encounter (Signed)
Patient made follow up appointment for 11-24-2012. Per Aram Beecham ok to fill Zofran and Carafate but patient must keep office appointment for further refills. Patient notified Patient verbalized understanding

## 2012-11-11 NOTE — Telephone Encounter (Signed)
Pt needs an OV  

## 2012-11-15 ENCOUNTER — Emergency Department (HOSPITAL_COMMUNITY)
Admission: EM | Admit: 2012-11-15 | Discharge: 2012-11-15 | Disposition: A | Discharge status: Home / Self Care | Payer: Medicaid Other | Source: Home / Self Care | Attending: Emergency Medicine | Admitting: Emergency Medicine

## 2012-11-15 ENCOUNTER — Encounter (HOSPITAL_COMMUNITY): Payer: Self-pay | Admitting: *Deleted

## 2012-11-15 DIAGNOSIS — M329 Systemic lupus erythematosus, unspecified: Secondary | ICD-10-CM

## 2012-11-15 MED ORDER — METHYLPREDNISOLONE ACETATE 80 MG/ML IJ SUSP
INTRAMUSCULAR | Status: AC
  Filled 2012-11-15: qty 1

## 2012-11-15 MED ORDER — DIPHENHYDRAMINE HCL 50 MG/ML IJ SOLN
INTRAMUSCULAR | Status: AC
  Filled 2012-11-15: qty 1

## 2012-11-15 MED ORDER — DIPHENHYDRAMINE HCL 50 MG/ML IJ SOLN
25.0000 mg | Freq: Once | INTRAMUSCULAR | Status: AC
  Administered 2012-11-15: 25 mg via INTRAMUSCULAR

## 2012-11-15 MED ORDER — HYDROXYZINE HCL 25 MG PO TABS: 50.0000 mg | ORAL_TABLET | Freq: Four times a day (QID) | ORAL | Status: DC | PRN

## 2012-11-15 MED ORDER — LORAZEPAM 2 MG/ML IJ SOLN
INTRAMUSCULAR | Status: AC
  Filled 2012-11-15: qty 1

## 2012-11-15 MED ORDER — METHYLPREDNISOLONE ACETATE 80 MG/ML IJ SUSP
80.0000 mg | Freq: Once | INTRAMUSCULAR | Status: AC
  Administered 2012-11-15: 80 mg via INTRAMUSCULAR

## 2012-11-15 MED ORDER — MAGIC MOUTHWASH: 5.0000 mL | Freq: Four times a day (QID) | ORAL | Status: DC

## 2012-11-15 MED ORDER — HYDROXYZINE HCL 50 MG/ML IM SOLN: 50.0000 mg | Freq: Once | INTRAMUSCULAR | Status: DC

## 2012-11-15 MED ORDER — HYDROCODONE-ACETAMINOPHEN 5-325 MG PO TABS: ORAL_TABLET | ORAL | Status: DC

## 2012-11-15 MED ORDER — KETOROLAC TROMETHAMINE 60 MG/2ML IM SOLN
60.0000 mg | Freq: Once | INTRAMUSCULAR | Status: AC
  Administered 2012-11-15: 60 mg via INTRAMUSCULAR

## 2012-11-15 MED ORDER — KETOROLAC TROMETHAMINE 60 MG/2ML IM SOLN
INTRAMUSCULAR | Status: AC
  Filled 2012-11-15: qty 2

## 2012-11-15 MED ORDER — ONDANSETRON 4 MG PO TBDP
ORAL_TABLET | ORAL | Status: AC
  Filled 2012-11-15: qty 1

## 2012-11-15 MED ORDER — LORAZEPAM 2 MG/ML IJ SOLN
1.0000 mg | Freq: Once | INTRAMUSCULAR | Status: AC
  Administered 2012-11-15: 1 mg via INTRAMUSCULAR

## 2012-11-15 MED ORDER — PREDNISONE 20 MG PO TABS: 20.0000 mg | ORAL_TABLET | Freq: Two times a day (BID) | ORAL | Status: DC

## 2012-11-15 NOTE — ED Provider Notes (Signed)
Chief Complaint:   Chief Complaint  Patient presents with  . Rash  . Facial Pain    History of Present Illness:   Brandi Santiago is a 42 year old female with a one-year history of systemic lupus erythematosus. She is being followed by Dr. Terri Piedra Dr. Earnest Bailey. She has been here several times in the past with flareups. She usually gets a cocktail of hydroxyzine 50 mg IM, Depo-Medrol 80 mg IM, and Toradol 60 mg IM. Current flareup has been going on for about a week and has been precipitated by stress. She notes facial pain and a rash on her face extending up into the scalp event her mouth. She notes overall aching and swelling. Her abdomen is swollen. She denies any fever or chills. She's not had any chest pain or shortness of breath. She denies any vomiting, diarrhea, blood in the stool. She has no known history of kidney involvement with her lupus.  Review of Systems:  Other than noted above, the patient denies any of the following symptoms. Systemic:  No fever, chills, sweats, fatigue, myalgias, headache, or anorexia. Eye:  No redness, pain or drainage. ENT:  No earache, nasal congestion, rhinorrhea, sinus pressure, or sore throat. Lungs:  No cough, sputum production, wheezing, shortness of breath.  Cardiovascular:  No chest pain, palpitations, or syncope. GI:  No nausea, vomiting, abdominal pain or diarrhea. GU:  No dysuria, frequency, or hematuria. Skin:  No rash or pruritis.  PMFSH:  Past medical history, family history, social history, meds, and allergies were reviewed.  She has multiple medical problems including rheumatoid arthritis, fibromyalgia, gastroesophageal reflux, and irritable bowel syndrome. She takes Wellbutrin, Lomotil, Nexium, Ativan, Toprol, Zofran, and Carafate.  Physical Exam:   Vital signs:  BP 144/98  Pulse 120  Temp(Src) 98.6 F (37 C) (Oral)  Resp 22  SpO2 100% General:  Alert, very anxious, otherwise in no distress. Eye:  PERRL, full EOMs.  Lids and conjunctivas  were normal. ENT:  TMs and canals were normal, without erythema or inflammation.  Nasal mucosa was clear and uncongested, without drainage.  Mucous membranes were moist.  Pharynx was clear, without exudate or drainage.  There were no oral ulcerations or lesions. I was unable to see any oral lesions or ulcerations. Neck:  Supple, no adenopathy, tenderness or mass. Thyroid was normal. Lungs:  No respiratory distress.  Lungs were clear to auscultation, without wheezes, rales or rhonchi.  Breath sounds were clear and equal bilaterally. Heart:  Regular rhythm, without gallops, murmers or rubs. Abdomen:  Soft, flat, and non-tender to palpation.  No hepatosplenomagaly or mass. Extremities: She has pain to palpation in all of her joint and muscle areas but no obvious swelling. Skin:  Clear, warm, and dry, she has multiple shallow ulcerations on her face and in her scalp as well as on her hands.  Course in Urgent Care Center:   Given hydroxyzine 50 mg IM, Depo-Medrol 80 mg IM, and Toradol 60 mg IM.  Assessment:  The encounter diagnosis was Systemic lupus erythematosus.  Systemic lupus with a flare up. She needs rheumatology followup. Her primary care doctor is trying to get her an appointment in [facility] which she desperately needs.  Plan:   1.  The following meds were prescribed:   New Prescriptions   HYDROCODONE-ACETAMINOPHEN (NORCO/VICODIN) 5-325 MG PER TABLET    1 to 2 tabs every 4 to 6 hours as needed for pain.   HYDROXYZINE (ATARAX/VISTARIL) 25 MG TABLET    Take 2 tablets (50  mg total) by mouth every 6 (six) hours as needed for itching.   PREDNISONE (DELTASONE) 20 MG TABLET    Take 1 tablet (20 mg total) by mouth 2 (two) times daily.   2.  The patient was instructed in symptomatic care and handouts were given. 3.  The patient was told to return if becoming worse in any way, if no better in 3 or 4 days, and given some red flag symptoms such as worsening pain or new neurological symptoms that  would indicate earlier return. 4.  Follow up with her primary care physician, Dr. Tracey Harries, or with a rheumatologist in [facility].       Reuben Likes, MD 11/15/12 [phone]

## 2012-11-15 NOTE — ED Notes (Signed)
C/o inflammation, blisters, swelling to face and all over body and itching onset yesterday.  Hx. Lupus.

## 2012-11-15 NOTE — ED Notes (Signed)
Requested Hydroxyzine from hospital pharmacy with A-11.  Pt. Understands she has to wait for [place] to get it.

## 2012-11-16 ENCOUNTER — Encounter: Payer: Self-pay | Admitting: Gastroenterology

## 2012-11-16 NOTE — ED Notes (Signed)
Call from [facility], [place] , requesting formulation of magic mouthwash. Per Hayden Rasmussen, NP, please use 60/60/60 benadryl, lidocaine, benadryl, quant 240 ml , NR, use 5 ml, swish and spit. [facility], left message on machine

## 2012-11-24 ENCOUNTER — Ambulatory Visit: Payer: Medicaid Other | Admitting: Gastroenterology

## 2012-11-24 ENCOUNTER — Telehealth: Payer: Self-pay | Admitting: *Deleted

## 2012-11-24 NOTE — Telephone Encounter (Signed)
Per Dr Sheryn Bison, patient should be charged for no show to her scheduled 11/24/12 appointment. She should also be discharged from the practice. She has no showed for 5 appointments (11/24/12, 09/20/11, 01/08/11, 01/01/11 and 10/28/10) and has been non compliant with following through with recommended treatment. Discharge letter has been created and is awaiting delivery to patient. All available medication refills have also been discontinued (spoke with [facility] @ [facility]).

## 2012-11-25 ENCOUNTER — Telehealth: Payer: Self-pay | Admitting: Gastroenterology

## 2012-11-25 NOTE — Telephone Encounter (Signed)
Dismissal Letter sent by Certified Mail on 11/25/2012  Received the Return Receipt showing someone picked up the Dismissal Letter  12/05/2012

## 2013-01-11 ENCOUNTER — Encounter: Payer: Self-pay | Admitting: Cardiovascular Disease

## 2013-01-11 ENCOUNTER — Telehealth: Payer: Self-pay | Admitting: Cardiology

## 2013-01-11 ENCOUNTER — Ambulatory Visit (INDEPENDENT_AMBULATORY_CARE_PROVIDER_SITE_OTHER): Payer: Medicaid Other | Admitting: Nurse Practitioner

## 2013-01-11 VITALS — BP 120/88 | HR 100 | Resp 20

## 2013-01-11 DIAGNOSIS — R000 Tachycardia, unspecified: Secondary | ICD-10-CM

## 2013-01-11 MED ORDER — METOPROLOL SUCCINATE ER 25 MG PO TB24: ORAL_TABLET | ORAL | Status: DC

## 2013-01-11 NOTE — Progress Notes (Signed)
Patient presents ambulatory from lobby in no acute distress here for nurse visit/EKG for c/o rapid heart rate and chest pain.  Patient states she takes Toprol XL 25 mg 3 pills every day and that it works well but she is out of medication. Patient is alert and oriented to person, place, time; skin warm dry and acyanotic.  Patient taken to exam room for vital signs and 12-lead EKG.  Patient states that the chest pain is not as bad as when she talked to me earlier as she has tried to calm herself down.  EKG taken to Dr. Johney Frame, DOD; EKG shows sinus tachycardia.  Dr. Johney Frame advised that patient continue Toprol XL 25 mg 3 pills QD and to follow up with Dr. Jens Som in the next couple of weeks.  Appointment scheduled for 9/23 @ 11:15 and Rx sent to patient's pharmacy as she states she has no more refills.  Patient ambulatory to discharge in no acute distress.

## 2013-01-11 NOTE — Telephone Encounter (Signed)
Out of med that Dr. Jens Som gave me for Tackycardia.  Took  2 Antivan and heart is high  @ 144.

## 2013-01-11 NOTE — Telephone Encounter (Signed)
Spoke with patient who c/o fast heart rate of 135-144 onset this morning.  Patient has a hx of sinus tachycardia and takes Toprol XL 25 mg 3 tabs QD which she has run out of.  Patient c/o chest pain that she rates a 7 on 0-10 scale, describes as "punched by a bat" and nausea today; states she is very stressed and has a hx of a bleeding stomach ulcer.  Patient states she is a little SOB.  I advised patient that Dr. Jens Som is not in the office today but that I will review her complaint with Dr. Johney Frame, DOD.  Patient verbalized understanding and agreement with plan.   I advised patient that Dr. Johney Frame advised that patient come to [facility] Office for EKG/nurse visit.  Patient verbalized agreement with plan of care and is en route.

## 2013-01-16 ENCOUNTER — Telehealth: Payer: Self-pay | Admitting: *Deleted

## 2013-01-16 NOTE — Telephone Encounter (Signed)
Have called patient to find out if medicaid has been paying for her toprol xl, I have received a PA note

## 2013-01-24 ENCOUNTER — Encounter: Payer: Self-pay | Admitting: Cardiology

## 2013-01-24 ENCOUNTER — Ambulatory Visit (INDEPENDENT_AMBULATORY_CARE_PROVIDER_SITE_OTHER): Payer: Medicaid Other | Admitting: Cardiology

## 2013-01-24 VITALS — BP 110/72 | HR 86 | Ht 66.0 in | Wt 139.0 lb

## 2013-01-24 DIAGNOSIS — R079 Chest pain, unspecified: Secondary | ICD-10-CM

## 2013-01-24 DIAGNOSIS — R000 Tachycardia, unspecified: Secondary | ICD-10-CM

## 2013-01-24 NOTE — Patient Instructions (Addendum)
Your physician recommends that you continue on your current medications as directed. Please refer to the Current Medication list given to you today.  Your physician wants you to follow-up in: 6 months. You will receive a reminder letter in the mail two months in advance. If you don't receive a letter, please call our office to schedule the follow-up appointment.  

## 2013-01-24 NOTE — Progress Notes (Signed)
HPI: Pleasant female for fu of palpitations. Patient seen in the emergency room in January of 2014 with chest pain. Chest x-ray showed no acute abnormalities. Electrocardiogram showed sinus rhythm with PACs and nonspecific ST changes. Troponin normal and d-dimer negative. Hemoglobin 10.3. Patient felt to be having an anxiety attack. Echocardiogram in May of 2014 showed normal LV function and grade 1 diastolic dysfunction. Holter monitor in May of 2014 showed sinus rhythm with sinus tachycardia. TSH in May of 2014 0.82; Hgb 12.9. The patient recently ran out of her Toprol. She developed tachycardia and chest pain. She presented to the office and her prescription was refilled. She now returns for followup. She does occasionally feel elevated heart rate but improved with beta blocker. No dyspnea. She has chronic unchanged chest pain. Her most recent episode has been continuous for 4 days.   Current Outpatient Prescriptions  Medication Sig Dispense Refill  . diphenoxylate-atropine (LOMOTIL) 2.5-0.025 MG per tablet Take 1 tablet by mouth 3 (three) times daily as needed for diarrhea or loose stools.  30 tablet  1  . esomeprazole (NEXIUM) 40 MG capsule Take 1 cap once daily before breakfast.  90 capsule  3  . FOLIC ACID PO Take 1 tablet by mouth daily.      Marland Kitchen LORazepam (ATIVAN) 1 MG tablet Take 1 tablet (1 mg total) by mouth 3 (three) times daily as needed for anxiety.  40 tablet  0  . metoprolol succinate (TOPROL-XL) 25 MG 24 hr tablet Take three tablets once daily  90 tablet  3  . MOVIPREP 100 G SOLR Take 1 kit (100 g total) by mouth once.  1 kit  0  . ondansetron (ZOFRAN) 4 MG tablet Take 2 tablets (8 mg total) by mouth every 8 (eight) hours as needed for nausea.  30 tablet  0  . Probiotic Product (PROBIOTIC DAILY PO) Take 1 tablet by mouth daily.      . sucralfate (CARAFATE) 1 GM/10ML suspension Take 10 mLs (1 g total) by mouth 4 (four) times daily.  420 mL  0  . hydrOXYzine (ATARAX/VISTARIL) 25 MG  tablet Take 2 tablets (50 mg total) by mouth every 6 (six) hours as needed for itching.  30 tablet  0   No current facility-administered medications for this visit.     Past Medical History  Diagnosis Date  . Irritable bowel syndrome   . Depressive disorder, not elsewhere classified   . Anxiety state, unspecified   . Esophageal reflux   . Ulcerative (chronic) proctitis   . MS (multiple sclerosis)   . Barrett esophagus   . Allergy   . Anemia   . Hiatal hernia   . Iron deficiency anemia, unspecified   . Kidney infection   . Lupus   . RA (rheumatoid arthritis)   . Tachycardia   . Fibromyalgia     Past Surgical History  Procedure Laterality Date  . Nasal sinus surgery    . Mandible fracture surgery    . Appendectomy    . Cesarean section    . Partial hysterectomy      History   Social History  . Marital Status: Divorced    Spouse Name: N/A    Number of Children: 2  . Years of Education: N/A   Occupational History  . Student    Social History Main Topics  . Smoking status: Never Smoker   . Smokeless tobacco: Never Used  . Alcohol Use: Yes     Comment: occasional  .  Drug Use: No  . Sexual Activity: Yes    Birth Control/ Protection: Surgical   Other Topics Concern  . Not on file   Social History Narrative  . No narrative on file    ROS: no fevers or chills, productive cough, hemoptysis, dysphasia, odynophagia, melena, hematochezia, dysuria, hematuria, rash, seizure activity, orthopnea, PND, pedal edema, claudication. Remaining systems are negative.  Physical Exam: Well-developed well-nourished in no acute distress.  Skin is warm and dry.  HEENT is normal.  Neck is supple.  Chest is clear to auscultation with normal expansion.  Cardiovascular exam is regular rate and rhythm.  Abdominal exam nontender or distended. No masses palpated. Extremities show no edema. neuro grossly intact  ECG sinus rhythm at a rate of 86. RV conduction delay. No ST  changes.

## 2013-01-24 NOTE — Assessment & Plan Note (Addendum)
Continue Toprol. I explained that she should not run out of this medication.

## 2013-01-24 NOTE — Assessment & Plan Note (Signed)
Pain does not appear to be cardiac.

## 2013-02-07 ENCOUNTER — Telehealth: Payer: Self-pay | Admitting: Gastroenterology

## 2013-02-12 ENCOUNTER — Emergency Department (HOSPITAL_COMMUNITY)
Admission: EM | Admit: 2013-02-12 | Discharge: 2013-02-12 | Disposition: A | Discharge status: Home / Self Care | Payer: Medicaid Other | Attending: Emergency Medicine | Admitting: Emergency Medicine

## 2013-02-12 ENCOUNTER — Encounter (HOSPITAL_COMMUNITY): Payer: Self-pay | Admitting: Emergency Medicine

## 2013-02-12 DIAGNOSIS — F411 Generalized anxiety disorder: Secondary | ICD-10-CM | POA: Insufficient documentation

## 2013-02-12 DIAGNOSIS — F3289 Other specified depressive episodes: Secondary | ICD-10-CM | POA: Insufficient documentation

## 2013-02-12 DIAGNOSIS — Z79899 Other long term (current) drug therapy: Secondary | ICD-10-CM | POA: Insufficient documentation

## 2013-02-12 DIAGNOSIS — W010XXA Fall on same level from slipping, tripping and stumbling without subsequent striking against object, initial encounter: Secondary | ICD-10-CM | POA: Insufficient documentation

## 2013-02-12 DIAGNOSIS — Z8739 Personal history of other diseases of the musculoskeletal system and connective tissue: Secondary | ICD-10-CM | POA: Insufficient documentation

## 2013-02-12 DIAGNOSIS — S0100XA Unspecified open wound of scalp, initial encounter: Secondary | ICD-10-CM | POA: Insufficient documentation

## 2013-02-12 DIAGNOSIS — Y92009 Unspecified place in unspecified non-institutional (private) residence as the place of occurrence of the external cause: Secondary | ICD-10-CM | POA: Insufficient documentation

## 2013-02-12 DIAGNOSIS — R21 Rash and other nonspecific skin eruption: Secondary | ICD-10-CM | POA: Diagnosis present

## 2013-02-12 DIAGNOSIS — S0990XA Unspecified injury of head, initial encounter: Secondary | ICD-10-CM | POA: Insufficient documentation

## 2013-02-12 DIAGNOSIS — D509 Iron deficiency anemia, unspecified: Secondary | ICD-10-CM | POA: Insufficient documentation

## 2013-02-12 DIAGNOSIS — K219 Gastro-esophageal reflux disease without esophagitis: Secondary | ICD-10-CM | POA: Insufficient documentation

## 2013-02-12 DIAGNOSIS — S0101XA Laceration without foreign body of scalp, initial encounter: Secondary | ICD-10-CM

## 2013-02-12 DIAGNOSIS — W19XXXA Unspecified fall, initial encounter: Secondary | ICD-10-CM

## 2013-02-12 DIAGNOSIS — Y939 Activity, unspecified: Secondary | ICD-10-CM | POA: Insufficient documentation

## 2013-02-12 DIAGNOSIS — Z87448 Personal history of other diseases of urinary system: Secondary | ICD-10-CM | POA: Insufficient documentation

## 2013-02-12 DIAGNOSIS — Z8669 Personal history of other diseases of the nervous system and sense organs: Secondary | ICD-10-CM | POA: Insufficient documentation

## 2013-02-12 DIAGNOSIS — F329 Major depressive disorder, single episode, unspecified: Secondary | ICD-10-CM | POA: Insufficient documentation

## 2013-02-12 MED ORDER — HYDROCORTISONE VALERATE 0.2 % EX OINT: TOPICAL_OINTMENT | Freq: Two times a day (BID) | CUTANEOUS | Status: AC

## 2013-02-12 MED ORDER — OXYCODONE-ACETAMINOPHEN 5-325 MG PO TABS: 1.0000 | ORAL_TABLET | Freq: Four times a day (QID) | ORAL | Status: DC | PRN

## 2013-02-12 MED ORDER — HYDROMORPHONE HCL PF 1 MG/ML IJ SOLN
1.0000 mg | Freq: Once | INTRAMUSCULAR | Status: AC
  Administered 2013-02-12: 1 mg via INTRAMUSCULAR
  Filled 2013-02-12: qty 1

## 2013-02-12 NOTE — ED Notes (Addendum)
Pt called out asking for more pain medication. Romeo Apple, MD is aware.

## 2013-02-12 NOTE — ED Notes (Signed)
Patient arrived via [facility] post fall at home. Patient slipped on wet floor striking her head with an inch lac and abrasions to her forehead. Denies any LOC. VSS.

## 2013-02-12 NOTE — ED Provider Notes (Signed)
CSN: [id number]     Arrival date & time 02/12/13  2143 History   First MD Initiated Contact with Patient 02/12/13 2145     Chief Complaint  Patient presents with  . Fall   (Consider location/radiation/quality/duration/timing/severity/associated sxs/prior Treatment) Patient is a 42 y.o. female presenting with fall. The history is provided by the patient.  Fall This is a new problem. The current episode started less than 1 hour ago. Episode frequency: once. The problem has been resolved. Associated symptoms include headaches. Pertinent negatives include no chest pain, no abdominal pain and no shortness of breath. Nothing aggravates the symptoms. Nothing relieves the symptoms. She has tried nothing for the symptoms. The treatment provided no relief.    Past Medical History  Diagnosis Date  . Irritable bowel syndrome   . Depressive disorder, not elsewhere classified   . Anxiety state, unspecified   . Esophageal reflux   . Ulcerative (chronic) proctitis   . MS (multiple sclerosis)   . Barrett esophagus   . Allergy   . Anemia   . Hiatal hernia   . Iron deficiency anemia, unspecified   . Kidney infection   . Lupus   . RA (rheumatoid arthritis)   . Tachycardia   . Fibromyalgia    Past Surgical History  Procedure Laterality Date  . Nasal sinus surgery    . Mandible fracture surgery    . Appendectomy    . Cesarean section    . Partial hysterectomy     Family History  Problem Relation Age of Onset  . Diabetes Paternal Grandmother   . Diabetes Paternal Grandfather   . Clotting disorder Father   . Cancer Father   . Colon cancer Father    History  Substance Use Topics  . Smoking status: Never Smoker   . Smokeless tobacco: Never Used  . Alcohol Use: Yes     Comment: occasional   OB History   Grav Para Term Preterm Abortions TAB SAB Ect Mult Living                 Review of Systems  Constitutional: Negative for fever and fatigue.  HENT: Negative for congestion and  drooling.   Eyes: Negative for pain.  Respiratory: Negative for cough and shortness of breath.   Cardiovascular: Negative for chest pain.  Gastrointestinal: Negative for nausea, vomiting, abdominal pain and diarrhea.  Genitourinary: Negative for dysuria and hematuria.  Musculoskeletal: Negative for back pain, gait problem and neck pain.  Skin: Negative for color change.  Neurological: Positive for headaches. Negative for dizziness.  Hematological: Negative for adenopathy.  Psychiatric/Behavioral: Negative for behavioral problems.  All other systems reviewed and are negative.    Allergies  Review of patient's allergies indicates no known allergies.  Home Medications   Current Outpatient Rx  Name  Route  Sig  Dispense  Refill  . diphenoxylate-atropine (LOMOTIL) 2.5-0.025 MG per tablet   Oral   Take 1 tablet by mouth 3 (three) times daily as needed for diarrhea or loose stools.   30 tablet   1   . esomeprazole (NEXIUM) 40 MG capsule      Take 1 cap once daily before breakfast.   90 capsule   3   . FOLIC ACID PO   Oral   Take 1 tablet by mouth daily.         . hydrOXYzine (ATARAX/VISTARIL) 25 MG tablet   Oral   Take 2 tablets (50 mg total) by mouth every 6 (six) hours  as needed for itching.   30 tablet   0   . LORazepam (ATIVAN) 1 MG tablet   Oral   Take 1 tablet (1 mg total) by mouth 3 (three) times daily as needed for anxiety.   40 tablet   0   . metoprolol succinate (TOPROL-XL) 25 MG 24 hr tablet      Take three tablets once daily   90 tablet   3   . MOVIPREP 100 G SOLR   Oral   Take 1 kit (100 g total) by mouth once.   1 kit   0     Dispense as written.   . ondansetron (ZOFRAN) 4 MG tablet   Oral   Take 2 tablets (8 mg total) by mouth every 8 (eight) hours as needed for nausea.   30 tablet   0     PATIENT NEEDS AN OFFICE VISIT FOR FURTHER REFILLS   . Probiotic Product (PROBIOTIC DAILY PO)   Oral   Take 1 tablet by mouth daily.         .  sucralfate (CARAFATE) 1 GM/10ML suspension   Oral   Take 10 mLs (1 g total) by mouth 4 (four) times daily.   420 mL   0    There were no vitals taken for this visit. Physical Exam  Nursing note and vitals reviewed. Constitutional: She is oriented to person, place, and time. She appears well-developed and well-nourished.  HENT:  Head: Normocephalic.  Mouth/Throat: Oropharynx is clear and moist. No oropharyngeal exudate.  Tm's clear bilaterally.   3cm superficial, well approximated, hemostatic laceration to the right frontal parietal area.   No vertebral ttp.   Eyes: Conjunctivae and EOM are normal. Pupils are equal, round, and reactive to light.  Neck: Normal range of motion. Neck supple.  Cardiovascular: Normal rate, regular rhythm, normal heart sounds and intact distal pulses.  Exam reveals no gallop and no friction rub.   No murmur heard. Pulmonary/Chest: Effort normal and breath sounds normal. No respiratory distress. She has no wheezes.  Abdominal: Soft. Bowel sounds are normal. There is no tenderness. There is no rebound and no guarding.  Musculoskeletal: Normal range of motion. She exhibits no edema and no tenderness.  Neurological: She is alert and oriented to person, place, and time.  Skin: Skin is warm and dry.  Psychiatric: She has a normal mood and affect. Her behavior is normal.    ED Course  LACERATION REPAIR Date/Time: 02/12/2013 11:08 PM Performed by: Purvis Sheffield, S Authorized by: Purvis Sheffield, S Consent: Verbal consent obtained. Risks and benefits: risks, benefits and alternatives were discussed Consent given by: patient Patient identity confirmed: verbally with patient, arm band and provided demographic data Time out: Immediately prior to procedure a "time out" was called to verify the correct patient, procedure, equipment, support staff and site/side marked as required. Body area: head/neck Location details: scalp Laceration length: 3  cm Patient sedated: no Irrigation solution: shur clens. Skin closure: glue Patient tolerance: Patient tolerated the procedure well with no immediate complications.   (including critical care time) Labs Review Labs Reviewed - No data to display Imaging Review No results found.  EKG Interpretation   None       MDM   1. Fall, initial encounter   2. Laceration of scalp, initial encounter   3. Malar rash    9:57 PM 42 y.o. female who presents with a mechanical fall which occurred prior to arrival. The patient states that she fell  on her bottom and her head went backwards hitting the door. She denies any loss of consciousness. She suffered a laceration to her right frontal parietal area. She has a moderate headache on exam now. She appears well and I have a low suspicion for serious injury. She notes that she has been on narcotics in the past and has a high tolerance, will give her eye and Dilaudid for her headache.  11:05 PM: Pt feeling better. Wouldl like refill of ointment for lupus facial rash.  I have discussed the diagnosis/risks/treatment options with the patient and family and believe the pt to be eligible for discharge home to follow-up with pcp as needed. We also discussed returning to the ED immediately if new or worsening sx occur. We discussed the sx which are most concerning (e.g., worsening pain) that necessitate immediate return. Any new prescriptions provided to the patient are listed below.  New Prescriptions   HYDROCORTISONE VALERATE OINTMENT (WESTCORT) 0.2 %    Apply topically 2 (two) times daily.     Junius Argyle, MD 02/12/13 2308

## 2013-02-12 NOTE — ED Notes (Signed)
Harrison, MD at bedside. 

## 2013-02-16 NOTE — Telephone Encounter (Signed)
Will speak to her in the am.

## 2013-02-17 NOTE — Telephone Encounter (Signed)
Spoke with Dr Jarold Motto and he will not accept pt back as a pt. He states he is retiring and she needs to find another doctor in another practice.lmom for pt to call back.

## 2013-02-17 NOTE — Telephone Encounter (Signed)
Informed pt Dr Jarold Motto will not accept her back as a pt. Pt stated understanding. Pt has been non compliant for many years, documented No Show appts on 11/24/12, 09/10/11, 01/08/11, 01/01/11, 10/28/10. She would ask for a  Med or for a procedure, call for a med refill and then cancel the appt.Marland Kitchen

## 2013-03-09 ENCOUNTER — Other Ambulatory Visit: Payer: Self-pay

## 2013-07-17 ENCOUNTER — Other Ambulatory Visit: Payer: Self-pay

## 2013-07-17 MED ORDER — METOPROLOL SUCCINATE ER 25 MG PO TB24: 75.0000 mg | ORAL_TABLET | Freq: Every day | ORAL | Status: DC

## 2013-07-21 ENCOUNTER — Other Ambulatory Visit: Payer: Self-pay | Admitting: Physician Assistant

## 2013-10-25 ENCOUNTER — Telehealth: Payer: Self-pay | Admitting: Cardiology

## 2013-10-25 NOTE — Telephone Encounter (Signed)
New problem ° ° °Pt having chest pain. °

## 2013-10-25 NOTE — Telephone Encounter (Signed)
The pt c/o CP, SOB and mild diaphoresis that has been going on for 1 week. Per Dr Mayford Knife (DOD) the pt is advised to go to the ER due to her s/s and because the office is closed at this time.The pt wants to see Dr Jens Som and is aware that he has no available appointments at this time. She states that if she does not go to the ER or Urgent care tonight she will call the office in the morning to see if she can be seen, if not by Dr Jens Som, by any available provider. She is aware that Dr Jens Som is not in the office this week. Will forward note to Deliah Goody, Dr Waunita Schooner nurse as Lorain Childes.

## 2013-10-27 NOTE — Telephone Encounter (Signed)
Spoke with pt, she cont to have cp and SOB. It has been going on for about one week now. It occurs every time she walks up the stairs. She also reports that she gets SOB with little exertion. She did not go to the ER as recommended because she did not want to wait. She was made a f/u appt on Monday and voiced understanding to go to the ER if symptoms change. She has a lot of anxiety issues and feels that maybe the cause of some of this.

## 2013-10-30 ENCOUNTER — Ambulatory Visit (INDEPENDENT_AMBULATORY_CARE_PROVIDER_SITE_OTHER): Payer: Medicaid Other | Admitting: Physician Assistant

## 2013-10-30 ENCOUNTER — Ambulatory Visit (INDEPENDENT_AMBULATORY_CARE_PROVIDER_SITE_OTHER)
Admission: RE | Admit: 2013-10-30 | Discharge: 2013-10-30 | Disposition: A | Discharge status: Home / Self Care | Payer: Medicaid Other | Source: Ambulatory Visit | Attending: Physician Assistant | Admitting: Physician Assistant

## 2013-10-30 ENCOUNTER — Encounter: Payer: Self-pay | Admitting: Physician Assistant

## 2013-10-30 ENCOUNTER — Telehealth: Payer: Self-pay | Admitting: *Deleted

## 2013-10-30 VITALS — BP 102/80 | HR 90 | Ht 66.0 in | Wt 119.0 lb

## 2013-10-30 DIAGNOSIS — R000 Tachycardia, unspecified: Secondary | ICD-10-CM

## 2013-10-30 DIAGNOSIS — R55 Syncope and collapse: Secondary | ICD-10-CM | POA: Diagnosis not present

## 2013-10-30 DIAGNOSIS — R0602 Shortness of breath: Secondary | ICD-10-CM | POA: Diagnosis not present

## 2013-10-30 DIAGNOSIS — R079 Chest pain, unspecified: Secondary | ICD-10-CM

## 2013-10-30 LAB — TROPONIN I: Troponin I: <0.3 ng/mL (ref <0.06)

## 2013-10-30 LAB — CBC WITH DIFFERENTIAL/PLATELET
Basophils Absolute: 0 10*3/uL (ref 0.0–0.1)
Basophils Relative: 0.7 % (ref 0.0–3.0)
EOS ABS: 0.1 10*3/uL (ref 0.0–0.7)
Eosinophils Relative: 2.5 % (ref 0.0–5.0)
HCT: 40 % (ref 36.0–46.0)
HEMOGLOBIN: 13.2 g/dL (ref 12.0–15.0)
LYMPHS PCT: 19.8 % (ref 12.0–46.0)
Lymphs Abs: 0.8 10*3/uL (ref 0.7–4.0)
MCHC: 32.9 g/dL (ref 30.0–36.0)
MCV: 96.4 fl (ref 78.0–100.0)
Monocytes Absolute: 0.3 10*3/uL (ref 0.1–1.0)
Monocytes Relative: 5.9 % (ref 3.0–12.0)
NEUTROS ABS: 3 10*3/uL (ref 1.4–7.7)
Neutrophils Relative %: 71.1 % (ref 43.0–77.0)
PLATELETS: 304 10*3/uL (ref 150.0–400.0)
RBC: 4.15 Mil/uL (ref 3.87–5.11)
RDW: 13.4 % (ref 11.5–15.5)
WBC: 4.3 10*3/uL (ref 4.0–10.5)

## 2013-10-30 LAB — BASIC METABOLIC PANEL
BUN: 13 mg/dL (ref 6–23)
CALCIUM: 9.7 mg/dL (ref 8.4–10.5)
CO2: 26 mEq/L (ref 19–32)
CREATININE: 1.2 mg/dL (ref 0.4–1.2)
Chloride: 108 mEq/L (ref 96–112)
GFR: 50.74 mL/min — AB (ref >60.00)
GLUCOSE: 79 mg/dL (ref 70–99)
Potassium: 4.2 mEq/L (ref 3.5–5.1)
SODIUM: 141 meq/L (ref 135–145)

## 2013-10-30 LAB — BRAIN NATRIURETIC PEPTIDE: Pro B Natriuretic peptide (BNP): 15 pg/mL (ref 0.0–100.0)

## 2013-10-30 LAB — TSH: TSH: 0.69 u[IU]/mL (ref 0.35–4.50)

## 2013-10-30 LAB — SEDIMENTATION RATE: Sed Rate: 13 mm/hr (ref 0–22)

## 2013-10-30 LAB — C-REACTIVE PROTEIN: CRP: 0.6 mg/dL (ref 0.5–20.0)

## 2013-10-30 MED ORDER — IOHEXOL 350 MG/ML SOLN
80.0000 mL | Freq: Once | INTRAVENOUS | Status: AC | PRN
  Administered 2013-10-30: 80 mL via INTRAVENOUS

## 2013-10-30 NOTE — Telephone Encounter (Signed)
Received a call from Robin in the lab, patients Troponin less than 0.30. Will forward to [employer] PA so he will be aware

## 2013-10-30 NOTE — Telephone Encounter (Signed)
pt notified about STAT Troponin results as well as CT-A results from today, NO PE. Pt aware scheduler will call to schedule ETT ECHO, and that this needs to be done before f/u 11/06/13.

## 2013-10-30 NOTE — Addendum Note (Signed)
Addended by: Tarri Fuller on: 10/30/2013 05:19 PM   Modules accepted: Orders

## 2013-10-30 NOTE — Telephone Encounter (Signed)
Ok Continue with current treatment plan. Tereso NewcomerScott Weaver, PA-C   10/30/2013 4:37 PM

## 2013-10-30 NOTE — Patient Instructions (Addendum)
YOU WILL HAVE CHEST CT-A TODAY DX 786.50, 786.05 AND TO r/o PE  LAB WORK TODAY, BMET, CBC , TSH, LFT, TROPONIN (STAT),, ESR, CRP  Your physician recommends that you schedule a follow-up appointment on: 11/06/13 @ 2:40 [place], PAC

## 2013-10-30 NOTE — Progress Notes (Signed)
Cardiology Office Note    Date:  10/30/2013   ID:  Brandi Santiago 1971/02/18, MRN [id number]  PCP:  Phineas Inches, MD  Cardiologist:  Dr. Kirk Ruths      History of Present Illness: Brandi Santiago is a 43 y.o. female with a history of palpitations, IBS, GERD. She was seen in emergency room for chest pain in 05/2012. Chest x-ray was unremarkable. ECG demonstrated nonspecific changes. Cardiac enzymes were normal. D-dimer was negative. Echocardiogram in 09/2012 demonstrated normal LV function. Holter monitor 09/2012 demonstrated NSR and sinus tachycardia. Last seen by Dr. Stanford Breed 01/2013. She has been maintained on beta blocker therapy for sinus tachycardia.  She called in last week with chest pain and dyspnea.  She was advised to go to the ED but chose not to do this.  She returns today for evaluation.  She notes sudden onset of chest pain and dyspnea ~ 1 week ago while driving in town. She feels sore in her L chest.  Also notes heaviness and tightness.  Pain has been constant since last week.  She notes worsening symptoms with deep breathing as well as with any type of activity.  She notes DOE.  She is NYHA Class 2b.  She denies orthopnea, PND, edema.  She does note 2 episodes of orthostatic lightheadedness followed by reported syncope.  She feels tired.  Bilateral arms have been numb at times.  She has monitored her HR with her iPhone and has recorded HRs in the 916B with this application.  She feels lightheaded with this.  She denies any recent travel, injury to her legs, surgery or hospitalization.  She denies tobacco abuse, OCP use or significant FHx of hypercoagulable state.  Studies:  - Echo (09/22/12):  EF 55-60%, normal wall motion, grade 1 diastolic dysfunction   Recent Labs: No results found for requested labs within last 365 days.  Wt Readings from Last 3 Encounters:  10/30/13 119 lb (53.978 kg)  01/24/13 139 lb (63.05 kg)  11/07/12 146 lb 1.9 oz (66.28 kg)     Past  Medical History  Diagnosis Date  . Irritable bowel syndrome   . Depressive disorder, not elsewhere classified   . Anxiety state, unspecified   . Esophageal reflux   . Ulcerative (chronic) proctitis   . MS (multiple sclerosis)   . Barrett esophagus   . Allergy   . Anemia   . Hiatal hernia   . Iron deficiency anemia, unspecified   . Kidney infection   . Lupus   . RA (rheumatoid arthritis)   . Tachycardia   . Fibromyalgia     Current Outpatient Prescriptions  Medication Sig Dispense Refill  . buPROPion (WELLBUTRIN XL) 150 MG 24 hr tablet Take 150 mg by mouth daily.      . cetirizine (ZYRTEC) 10 MG tablet Take 10 mg by mouth daily.      Marland Kitchen esomeprazole (NEXIUM) 40 MG capsule Take 40 mg by mouth daily before breakfast.      . fluticasone (FLONASE) 50 MCG/ACT nasal spray Place 2 sprays into both nostrils daily.      . hydrocortisone valerate ointment (WESTCORT) 0.2 % Apply topically 2 (two) times daily.  45 g  0  . LORazepam (ATIVAN) 1 MG tablet Take 1 tablet (1 mg total) by mouth 3 (three) times daily as needed for anxiety.  40 tablet  0  . metoprolol succinate (TOPROL-XL) 25 MG 24 hr tablet Take 3 tablets (75 mg total) by mouth daily.  90 tablet  3  . ondansetron (ZOFRAN) 4 MG tablet Take 2 tablets (8 mg total) by mouth every 8 (eight) hours as needed for nausea.  30 tablet  0  . topiramate (TOPAMAX) 25 MG tablet Take 25 mg by mouth 3 (three) times daily.       No current facility-administered medications for this visit.    Allergies:   Review of patient's allergies indicates no known allergies.   Social History:  The patient  reports that she has never smoked. She has never used smokeless tobacco. She reports that she drinks alcohol. She reports that she does not use illicit drugs.   Family History:  The patient's family history includes Cancer in her father; Clotting disorder in her father; Colon cancer in her father; Diabetes in her paternal grandfather and paternal grandmother.     ROS:  Please see the history of present illness.   She notes an unintentional 20 lb weight loss over the last 3-4 mos.  She has occ sweats.  She has chronic nausea.   All other systems reviewed and negative.   PHYSICAL EXAM: VS:  BP 102/80  Pulse 90  Ht 5' 6" (1.676 m)  Wt 119 lb (53.978 kg)  BMI 19.22 kg/m2  O2 96% on RA  Orthostatic VS: BP Lying:  113/79   HR:  96 BP Sitting:  123/80  HR:  66 BP Standing:  126/75  HR:  9  Well nourished, well developed, in no acute distress HEENT: normal Neck: no JVD Vascular:  No carotid bruits Cardiac:  normal S1, S2; RRR; no murmur Lungs:  clear to auscultation bilaterally, no wheezing, rhonchi or rales Abd: soft, nontender, no hepatomegaly Ext: no edema Skin: warm and dry Neuro:  CNs 2-12 intact, no focal abnormalities noted  EKG:  NSR, HR 90, LAD, inf and ant Q waves, no change from prior tracing      ASSESSMENT AND PLAN:  1. Chest pain, unspecified:  Etiology not clear.  The description of her symptoms are concerning for pulmonary embolism.  I have recommended proceeding with Chest CTA to rule out pulmonary embolism as well aortic dissection.  I will also obtain Troponin, BMET, BNP, CBC, TSH, LFTs. I reviewed her case with Dr. Ena Dawley (DOD).  Will also get a CRP and ESR.  If ESR elevated, consider NSAID + Colchicine for possible pericarditis (no ST elevation noted on ECG).  If troponin normal, proceed with stress testing.  If Troponin elevated, send to the ED for admission and cardiac cath.  Plan early follow up.   2. Shortness of breath:  As noted, proceed with workup as above. 3. Tachycardia:  Plan event monitor if workup unrevealing.  Continue beta blocker. 4. Syncope, unspecified syncope type:  Orthostatics normal.  No driving for now.  Proceed with workup as above.  Plan event monitor if workup unremarkable.   5. Disposition:  F/u with Dr. Kirk Ruths or me in 1 week.   Signed, Versie Starks, MHS 10/30/2013  12:49 PM    [facility], [place], [place]  [address] Phone: [phone]; Fax: [phone]

## 2013-10-31 ENCOUNTER — Telehealth: Payer: Self-pay | Admitting: *Deleted

## 2013-10-31 ENCOUNTER — Ambulatory Visit: Payer: Medicaid Other

## 2013-10-31 DIAGNOSIS — R7989 Other specified abnormal findings of blood chemistry: Secondary | ICD-10-CM

## 2013-10-31 LAB — HEPATIC FUNCTION PANEL
ALBUMIN: 4.4 g/dL (ref 3.5–5.2)
ALT: 15 U/L (ref 0–35)
AST: 19 U/L (ref 0–37)
Alkaline Phosphatase: 41 U/L (ref 39–117)
Bilirubin, Direct: 0.1 mg/dL (ref 0.0–0.3)
Total Bilirubin: 0.3 mg/dL (ref 0.2–1.2)
Total Protein: 7.8 g/dL (ref 6.0–8.3)

## 2013-10-31 NOTE — Telephone Encounter (Signed)
Great news lmom labs normal

## 2013-11-01 ENCOUNTER — Ambulatory Visit (HOSPITAL_COMMUNITY): Payer: Medicaid Other | Attending: Cardiology | Admitting: Radiology

## 2013-11-01 VITALS — BP 95/77 | Ht 66.0 in | Wt 118.0 lb

## 2013-11-01 DIAGNOSIS — R079 Chest pain, unspecified: Secondary | ICD-10-CM

## 2013-11-01 DIAGNOSIS — R002 Palpitations: Secondary | ICD-10-CM | POA: Insufficient documentation

## 2013-11-01 DIAGNOSIS — R5383 Other fatigue: Secondary | ICD-10-CM

## 2013-11-01 DIAGNOSIS — R0789 Other chest pain: Secondary | ICD-10-CM | POA: Insufficient documentation

## 2013-11-01 DIAGNOSIS — R0602 Shortness of breath: Secondary | ICD-10-CM | POA: Diagnosis not present

## 2013-11-01 DIAGNOSIS — R5381 Other malaise: Secondary | ICD-10-CM | POA: Insufficient documentation

## 2013-11-01 DIAGNOSIS — R0989 Other specified symptoms and signs involving the circulatory and respiratory systems: Secondary | ICD-10-CM | POA: Insufficient documentation

## 2013-11-01 DIAGNOSIS — R0609 Other forms of dyspnea: Secondary | ICD-10-CM | POA: Insufficient documentation

## 2013-11-01 MED ORDER — TECHNETIUM TC 99M SESTAMIBI GENERIC - CARDIOLITE
10.0000 | Freq: Once | INTRAVENOUS | Status: AC | PRN
  Administered 2013-11-01: 10 via INTRAVENOUS

## 2013-11-01 MED ORDER — TECHNETIUM TC 99M SESTAMIBI GENERIC - CARDIOLITE
30.0000 | Freq: Once | INTRAVENOUS | Status: AC | PRN
  Administered 2013-11-01: 30 via INTRAVENOUS

## 2013-11-01 NOTE — Progress Notes (Signed)
[facility] SITE 3 NUCLEAR MED [address] [place], [place] [address] [phone]    Cardiology Nuclear Med Study  Brandi Santiago is a 43 y.o. female     MRN : [id number]     DOB: [birth year]  Procedure Date: 11/01/2013  Nuclear Med Background Indication for Stress Test:  Evaluation for Ischemia History:  No H/O CAD 09/2012 ECHO: EF: 55-60% Cardiac Risk Factors: None  Symptoms:  Chest Tightness, DOE, Fatigue, Palpitations and SOB   Nuclear Pre-Procedure Caffeine/Decaff Intake:  None> 12 hrs NPO After: 11:00pm   Lungs:  clear O2 Sat: 100% on room air. IV 0.9% NS with Angio Cath:  20g  IV Site: R Antecubital x 1, tolerated well IV Started by:  Irean Hong, RN  Chest Size (in):  34 Cup Size: C  Height: 5\' 6"  (1.676 m)  Weight:  118 lb (53.524 kg)  BMI:  Body mass index is 19.05 kg/(m^2). Tech Comments:  Patient held Toprol 24 hrs. Irean Hong, RN.    Nuclear Med Study 1 or 2 day study: 1 day  Stress Test Type:  Stress  Reading MD: N/A  Order Authorizing Provider:  Olga Millers, MD  Resting Radionuclide: Technetium 74m Sestamibi  Resting Radionuclide Dose: 11.0 mCi   Stress Radionuclide:  Technetium 5m Sestamibi  Stress Radionuclide Dose: 33.0 mCi           Stress Protocol Rest HR: 79 Stress HR: 160  Rest BP: 95/77 Stress BP: 120/78  Exercise Time (min): 7:30 METS: 9.30   Predicted Max HR: 180 bpm % Max HR: 88.89 bpm Rate Pressure Product: 88891   Dose of Adenosine (mg):  n/a Dose of Lexiscan: n/a mg  Dose of Atropine (mg): n/a Dose of Dobutamine: n/a mcg/kg/min (at max HR)  Stress Test Technologist: Milana Na, EMT-P  Nuclear Technologist:  Domenic Polite, CNMT     Rest Procedure:  Myocardial perfusion imaging was performed at rest 45 minutes following the intravenous administration of Technetium 67m Sestamibi. Rest ECG:   Stress Procedure:  The patient exercised on the treadmill utilizing the Bruce Protocol for 9:30 minutes. The patient  stopped due to sob, fatigue, and denied any chest tightness.  Technetium 60m Sestamibi was injected at peak exercise and myocardial perfusion imaging was performed after a brief delay. Stress ECG: No significant change from baseline ECG  QPS Raw Data Images:  Normal; no motion artifact; normal heart/lung ratio. Stress Images:  Very small area of slight decreased activity at the apical inferior segment. Rest Images:  Normal homogeneous uptake in all areas of the myocardium. Subtraction (SDS):  Maybe very slight reversibility at the apical inferior segment. Transient Ischemic Dilatation (Normal <1.22):  0.91 Lung/Heart Ratio (Normal <0.45):  0.28  Quantitative Gated Spect Images QGS EDV:  65 ml QGS ESV:  28 ml  Impression Exercise Capacity:  Fairly good exercise capacity. BP Response:  Normal blood pressure response. Clinical Symptoms:  Chest tightness ECG Impression:  No significant ST segment change suggestive of ischemia. Comparison with Prior Nuclear Study: No images to compare  Overall Impression:  This is a low-risk scan. There is a very small area of slight decreased activity with slight reversibility at the apical inferior segment. This could be from diaphragmatic attenuation. Wall motion is normal. This argues against a significant abnormality. I cannot rule out the possibility of very slight ischemia.  LV Ejection Fraction: 57%.  LV Wall Motion:  Normal Wall Motion.  Willa Rough , MD

## 2013-11-02 ENCOUNTER — Telehealth: Payer: Self-pay | Admitting: Cardiology

## 2013-11-02 ENCOUNTER — Encounter: Payer: Self-pay | Admitting: Physician Assistant

## 2013-11-02 NOTE — Telephone Encounter (Signed)
Spoke with pt, she is having pain in her chest that if she presses on the area with her two fingers the pain will go away. Reassurance given to pt that is not related to her heart. She will try ibuprofen or tylenol for the discomfort. Patient voiced understanding

## 2013-11-02 NOTE — Telephone Encounter (Signed)
New message    Patient calling c/o pain hurting in chest. Wants to know can Dr. Jens Som call her in something. Will be going out of town to [place]   Pain level 7-8   Took am medication    No blood pressure reading.

## 2013-11-06 ENCOUNTER — Ambulatory Visit: Payer: Medicaid Other | Admitting: Physician Assistant

## 2013-11-09 ENCOUNTER — Encounter: Payer: Self-pay | Admitting: Physician Assistant

## 2013-11-21 ENCOUNTER — Ambulatory Visit: Payer: Medicaid Other | Admitting: Physician Assistant

## 2013-11-24 ENCOUNTER — Encounter: Payer: Self-pay | Admitting: Cardiology

## 2013-11-24 ENCOUNTER — Ambulatory Visit (INDEPENDENT_AMBULATORY_CARE_PROVIDER_SITE_OTHER): Payer: Medicaid Other | Admitting: Cardiology

## 2013-11-24 VITALS — BP 103/70 | HR 58 | Ht 66.0 in | Wt 118.0 lb

## 2013-11-24 DIAGNOSIS — R071 Chest pain on breathing: Secondary | ICD-10-CM

## 2013-11-24 DIAGNOSIS — I209 Angina pectoris, unspecified: Secondary | ICD-10-CM

## 2013-11-24 DIAGNOSIS — R079 Chest pain, unspecified: Secondary | ICD-10-CM | POA: Diagnosis not present

## 2013-11-24 MED ORDER — OXYCODONE-ACETAMINOPHEN 7.5-325 MG PO TABS: 1.0000 | ORAL_TABLET | ORAL | Status: DC | PRN

## 2013-11-24 MED ORDER — HYDROCODONE-ACETAMINOPHEN 7.5-500 MG PO TABS: 1.0000 | ORAL_TABLET | Freq: Four times a day (QID) | ORAL | Status: DC | PRN

## 2013-11-24 NOTE — Patient Instructions (Addendum)
This may be costochondritis an inflammation in the tissue in your chest wall.  Vicodin 1-2 tabs every 4-6 hours as needed.  Best not to drive on these meds.  Follow up next week with Dr. Velta Addison or APP for further plans.

## 2013-11-24 NOTE — Progress Notes (Signed)
11/30/2013   PCP: Aura DialsBOUSKA,DAVID E, MD   Chief Complaint  Patient presents with  . Follow-up    C/o chest pain 7.5/10- "having chest pain right now, hurts to breathe," can't catch breath, legs ache.    Primary Cardiologist:Dr. Velta AddisonB. Crenshaw   HPI:  43 year old female with a hx of palpitations, IBS, GERD originally seen in ER with chest pain.  Negative MI, normal CXR, EKG without acute changes.  Holter monitor with NSR.  She is on BB and has done well with less episodes of S tach.  She was seen in June with constant ches tpain, increases with deep breath.  Arm numbness at times.  She recently had echo in May   EF 55-60%, normal wall motion, grade 1 diastolic dysfunction. After visit in June she had CTA neg for PE, negative stress test.  She presents today for continued pain and she is now frustrated that the pain continues.      No Known Allergies  Current Outpatient Prescriptions  Medication Sig Dispense Refill  . buPROPion (WELLBUTRIN XL) 150 MG 24 hr tablet Take 150 mg by mouth daily.      . cetirizine (ZYRTEC) 10 MG tablet Take 10 mg by mouth daily.      Marland Kitchen. esomeprazole (NEXIUM) 40 MG capsule Take 40 mg by mouth daily before breakfast.      . fluticasone (FLONASE) 50 MCG/ACT nasal spray Place 2 sprays into both nostrils daily.      . hydrocortisone valerate ointment (WESTCORT) 0.2 % Apply topically 2 (two) times daily.  45 g  0  . LORazepam (ATIVAN) 1 MG tablet Take 1 tablet (1 mg total) by mouth 3 (three) times daily as needed for anxiety.  40 tablet  0  . ondansetron (ZOFRAN) 4 MG tablet Take 2 tablets (8 mg total) by mouth every 8 (eight) hours as needed for nausea.  30 tablet  0  . topiramate (TOPAMAX) 25 MG tablet Take 25 mg by mouth 3 (three) times daily.      . metoprolol succinate (TOPROL-XL) 25 MG 24 hr tablet Take 3 tablets (75 mg total) by mouth daily.  90 tablet  12  . oxyCODONE-acetaminophen (PERCOCET) 7.5-325 MG per tablet Take 1 tablet by mouth every 4  (four) hours as needed for pain.  30 tablet  0   No current facility-administered medications for this visit.    Past Medical History  Diagnosis Date  . Irritable bowel syndrome   . Depressive disorder, not elsewhere classified   . Anxiety state, unspecified   . Esophageal reflux   . Ulcerative (chronic) proctitis   . MS (multiple sclerosis)   . Barrett esophagus   . Allergy   . Anemia   . Hiatal hernia   . Iron deficiency anemia, unspecified   . Kidney infection   . Lupus   . RA (rheumatoid arthritis)   . Tachycardia   . Fibromyalgia   . Hx of cardiovascular stress test     ETT-Myoview (11/2013):  Apical inferior reversibility (diaph atten vs very slight ischemia), normal wall motion, EF 57%; low risk    Past Surgical History  Procedure Laterality Date  . Nasal sinus surgery    . Mandible fracture surgery    . Appendectomy    . Cesarean section    . Partial hysterectomy      OZH:YQMVHQI:ONROS:General:no colds or fevers, no weight changes Skin:no rashes or ulcers HEENT:no blurred vision, no  congestion CV:see HPI PUL:see HPI GI:no diarrhea constipation or melena, +  Indigestion on medicaiton GU:no hematuria, no dysuria MS:no joint pain, no claudication Neuro:no syncope, no lightheadedness Endo:no diabetes, no thyroid disease  Wt Readings from Last 3 Encounters:  11/28/13 122 lb (55.339 kg)  11/24/13 118 lb (53.524 kg)  11/01/13 118 lb (53.524 kg)    PHYSICAL EXAM BP 103/70  Pulse 58  Ht 5\' 6"  (1.676 m)  Wt 118 lb (53.524 kg)  BMI 19.05 kg/m2 General:Pleasant affect, NAD Skin:Warm and dry, brisk capillary refill HEENT:normocephalic, sclera clear, mucus membranes moist Neck:supple, no JVD, no bruits  Heart:S1S2 RRR without murmur, gallup, rub or click, + tenderness to palpation of chest wasll Lungs:clear without rales, rhonchi, or wheezes WUJ:WJXB, non tender, + BS, do not palpate liver spleen or masses Ext:no lower ext edema, 2+ pedal pulses, 2+ radial  pulses Neuro:alert and oriented, MAE, follows commands, + facial symmetry EKG:SB at 58 with no acute changes. LAFB,   ASSESSMENT AND PLAN Chest pain Felt to be costochondritis , all test have been negative for cardiac pain.  I would prefer to add NSAIDS but she cannot take due to her stomach issues.  I discussed with Dr. Judie Petit. Croitoru- will add pain meds for now.  GERD may be playing a role as well. VIcodin ordered for pain.  She thought she would see Dr. Velta Addison today, but he is not in office.  We will arrange a follow up with him ASAP.

## 2013-11-28 ENCOUNTER — Encounter: Payer: Self-pay | Admitting: Cardiology

## 2013-11-28 ENCOUNTER — Ambulatory Visit (INDEPENDENT_AMBULATORY_CARE_PROVIDER_SITE_OTHER): Payer: Medicaid Other | Admitting: Cardiology

## 2013-11-28 VITALS — BP 96/65 | HR 71 | Ht 66.0 in | Wt 122.0 lb

## 2013-11-28 DIAGNOSIS — R000 Tachycardia, unspecified: Secondary | ICD-10-CM | POA: Diagnosis not present

## 2013-11-28 DIAGNOSIS — R079 Chest pain, unspecified: Secondary | ICD-10-CM | POA: Diagnosis not present

## 2013-11-28 DIAGNOSIS — R55 Syncope and collapse: Secondary | ICD-10-CM | POA: Insufficient documentation

## 2013-11-28 MED ORDER — METOPROLOL SUCCINATE ER 25 MG PO TB24: 75.0000 mg | ORAL_TABLET | Freq: Every day | ORAL | Status: DC

## 2013-11-28 NOTE — Patient Instructions (Signed)
Your physician wants you to follow-up in: 6 MONTHS WITH DR CRENSHAW You will receive a reminder letter in the mail two months in advance. If you don't receive a letter, please call our office to schedule the follow-up appointment.  

## 2013-11-28 NOTE — Assessment & Plan Note (Signed)
Symptoms sound most likely to be musculoskeletal. I have reviewed her nuclear study. I believe it shows inferior thinning but no ischemia. Her CT showed no pulmonary embolus. I do not think further cardiac workup is necessary. We discussed nonsteroidals but apparently that has caused problems from a gastrointestinal standpoint in the past. I recommended Tylenol and followup with her primary care.

## 2013-11-28 NOTE — Assessment & Plan Note (Signed)
Symptoms sound to have been orthostatic mediated. LV function is normal.

## 2013-11-28 NOTE — Progress Notes (Signed)
HPI: FU palpitations. She was seen in emergency room for chest pain in 05/2012. Chest x-ray was unremarkable. ECG demonstrated nonspecific changes. Cardiac enzymes were normal. D-dimer was negative. Echocardiogram in 09/2012 demonstrated normal LV function and grade 1 diastolic dysfunction. Holter monitor 09/2012 demonstrated NSR and sinus tachycardia. Seen recently by Tereso NewcomerScott Weaver with complaints of chest pain and syncope. CTA in June of 2015 showed no pulmonary embolus. Nuclear study repeated in July 2015. Ejection fraction 57%. There was felt to be diaphragmatic attenuation but slight ischemia could not be excluded. Laboratories in June of 2015 showed normal liver functions, normal BMP, normal hemoglobin and TSH. Normal sedimentation rate. Patient with multiple complaints today. She continues to have chest pain. It has been continuous for 6 weeks without completely resolving. It increases with cough and certain movements. It also increases with bending over. She also describes dyspnea with exertion. She states her symptoms increase with stress.     Current Outpatient Prescriptions  Medication Sig Dispense Refill  . buPROPion (WELLBUTRIN XL) 150 MG 24 hr tablet Take 150 mg by mouth daily.      . cetirizine (ZYRTEC) 10 MG tablet Take 10 mg by mouth daily.      Marland Kitchen. esomeprazole (NEXIUM) 40 MG capsule Take 40 mg by mouth daily before breakfast.      . fluticasone (FLONASE) 50 MCG/ACT nasal spray Place 2 sprays into both nostrils daily.      . hydrocortisone valerate ointment (WESTCORT) 0.2 % Apply topically 2 (two) times daily.  45 g  0  . LORazepam (ATIVAN) 1 MG tablet Take 1 tablet (1 mg total) by mouth 3 (three) times daily as needed for anxiety.  40 tablet  0  . metoprolol succinate (TOPROL-XL) 25 MG 24 hr tablet Take 3 tablets (75 mg total) by mouth daily.  90 tablet  3  . ondansetron (ZOFRAN) 4 MG tablet Take 2 tablets (8 mg total) by mouth every 8 (eight) hours as needed for nausea.  30  tablet  0  . oxyCODONE-acetaminophen (PERCOCET) 7.5-325 MG per tablet Take 1 tablet by mouth every 4 (four) hours as needed for pain.  30 tablet  0  . topiramate (TOPAMAX) 25 MG tablet Take 25 mg by mouth 3 (three) times daily.       No current facility-administered medications for this visit.     Past Medical History  Diagnosis Date  . Irritable bowel syndrome   . Depressive disorder, not elsewhere classified   . Anxiety state, unspecified   . Esophageal reflux   . Ulcerative (chronic) proctitis   . MS (multiple sclerosis)   . Barrett esophagus   . Allergy   . Anemia   . Hiatal hernia   . Iron deficiency anemia, unspecified   . Kidney infection   . Lupus   . RA (rheumatoid arthritis)   . Tachycardia   . Fibromyalgia   . Hx of cardiovascular stress test     ETT-Myoview (11/2013):  Apical inferior reversibility (diaph atten vs very slight ischemia), normal wall motion, EF 57%; low risk    Past Surgical History  Procedure Laterality Date  . Nasal sinus surgery    . Mandible fracture surgery    . Appendectomy    . Cesarean section    . Partial hysterectomy      History   Social History  . Marital Status: Divorced    Spouse Name: N/A    Number of Children: 2  . Years of  Education: N/A   Occupational History  . Student    Social History Main Topics  . Smoking status: Never Smoker   . Smokeless tobacco: Never Used  . Alcohol Use: Yes     Comment: occasional  . Drug Use: No  . Sexual Activity: Yes    Birth Control/ Protection: Surgical   Other Topics Concern  . Not on file   Social History Narrative  . No narrative on file    ROS: no fevers or chills, productive cough, hemoptysis, dysphasia, odynophagia, melena, hematochezia, dysuria, hematuria, rash, seizure activity, orthopnea, PND, pedal edema, claudication. Remaining systems are negative.  Physical Exam: Well-developed well-nourished in no acute distress.  Skin is warm and dry.  HEENT is normal.    Neck is supple.  Chest is clear to auscultation with normal expansion.  Cardiovascular exam is regular rate and rhythm.  Abdominal exam nontender or distended. No masses palpated. Extremities show no edema. neuro grossly intact  Electrocardiogram shows sinus rhythm, RV conduction delay.No ST changes.

## 2013-11-28 NOTE — Assessment & Plan Note (Signed)
Continue beta blocker. 

## 2013-11-30 NOTE — Assessment & Plan Note (Addendum)
Felt to be costochondritis , all test have been negative for cardiac pain.  I would prefer to add NSAIDS but she cannot take due to her stomach issues.  I discussed with Dr. Judie Petit. Croitoru- will add pain meds for now. If symptoms continue then cardiac cath may be warranted but she should see her primary cardiologist first. GERD may be playing a role as well. VIcodin ordered for pain.  She thought she would see Dr. Velta Addison today, but he is not in office.  We will arrange a follow up with him ASAP.

## 2013-12-20 ENCOUNTER — Other Ambulatory Visit: Payer: Self-pay | Admitting: *Deleted

## 2013-12-20 ENCOUNTER — Telehealth: Payer: Self-pay | Admitting: Cardiology

## 2013-12-20 MED ORDER — METOPROLOL SUCCINATE ER 25 MG PO TB24: 75.0000 mg | ORAL_TABLET | Freq: Every day | ORAL | Status: DC

## 2013-12-20 NOTE — Telephone Encounter (Signed)
Brand toprol sent to patient's pharmacy per her request. Notified patient this has been done.

## 2013-12-20 NOTE — Telephone Encounter (Signed)
New Prob    Pt states she needs TOPROL-XL instead the generic version as her insurance company will not cover the generic medication. Pharmacy of choice: [facility] on [address]. Please call.

## 2014-02-06 ENCOUNTER — Ambulatory Visit (INDEPENDENT_AMBULATORY_CARE_PROVIDER_SITE_OTHER): Payer: Medicaid Other | Admitting: Psychiatry

## 2014-02-06 ENCOUNTER — Encounter (HOSPITAL_COMMUNITY): Payer: Self-pay | Admitting: Psychiatry

## 2014-02-06 VITALS — BP 107/87 | HR 76 | Ht 66.5 in | Wt 121.6 lb

## 2014-02-06 DIAGNOSIS — F410 Panic disorder [episodic paroxysmal anxiety] without agoraphobia: Secondary | ICD-10-CM | POA: Insufficient documentation

## 2014-02-06 DIAGNOSIS — R55 Syncope and collapse: Secondary | ICD-10-CM

## 2014-02-06 DIAGNOSIS — F411 Generalized anxiety disorder: Secondary | ICD-10-CM

## 2014-02-06 DIAGNOSIS — R000 Tachycardia, unspecified: Secondary | ICD-10-CM

## 2014-02-06 DIAGNOSIS — F332 Major depressive disorder, recurrent severe without psychotic features: Secondary | ICD-10-CM

## 2014-02-06 DIAGNOSIS — R0602 Shortness of breath: Secondary | ICD-10-CM

## 2014-02-06 MED ORDER — HYDROXYZINE PAMOATE 25 MG PO CAPS: ORAL_CAPSULE | ORAL | Status: DC

## 2014-02-06 MED ORDER — LORAZEPAM 1 MG PO TABS: 1.0000 mg | ORAL_TABLET | Freq: Three times a day (TID) | ORAL | Status: DC | PRN

## 2014-02-06 MED ORDER — ARIPIPRAZOLE 5 MG PO TABS: 5.0000 mg | ORAL_TABLET | Freq: Every day | ORAL | Status: DC

## 2014-02-06 MED ORDER — BUPROPION HCL ER (XL) 150 MG PO TB24: 300.0000 mg | ORAL_TABLET | Freq: Every day | ORAL | Status: DC
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
## 2014-03-04 IMAGING — CT CT T SPINE W/O CM
3 series · 11 of 33 positions shown, 13 images · non-contrast
Comparison: Radiographs today.  No process of all imaging
comparison.

CLINICAL DATA: T7 compression fracture.

CT THORACIC SPINE WITHOUT CONTRAST
TECHNIQUE: Multidetector CT imaging of the thoracic spine was
performed without intravenous contrast administration. Multiplanar
CT image reconstructions were also generated

[Series 4: spine 2.0 b31s st · axial · 0.27mm/px · z∈[-300,-84]mm · 3 of 177 slices shown, 4 images]
[im 41/177  soft-tissue]
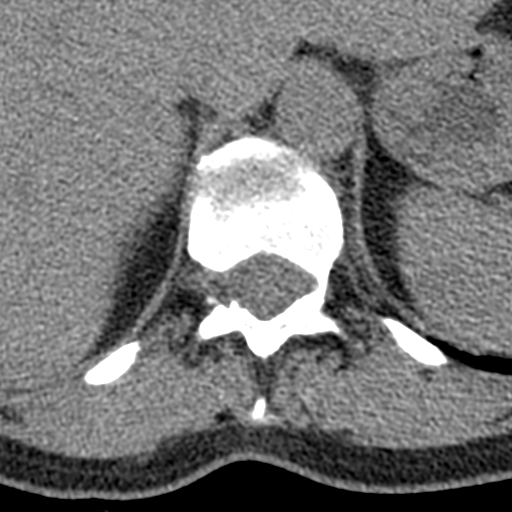
[im 41/177  bone]
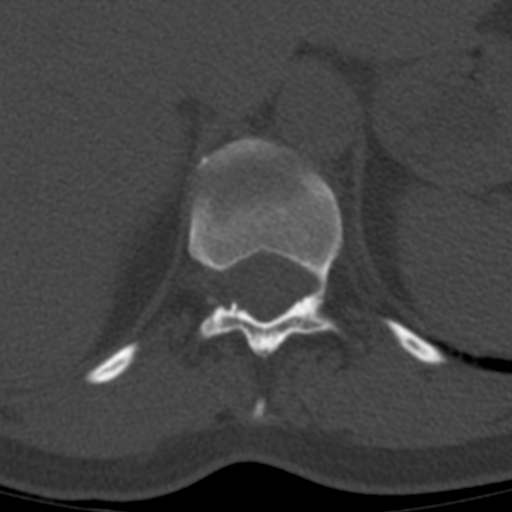
[im 95/177  bone]
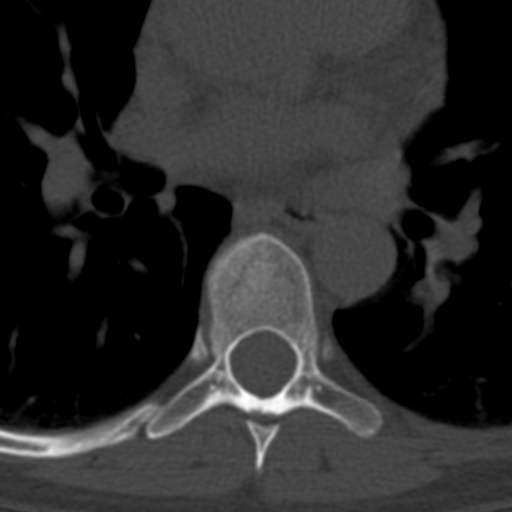
[im 149/177  bone]
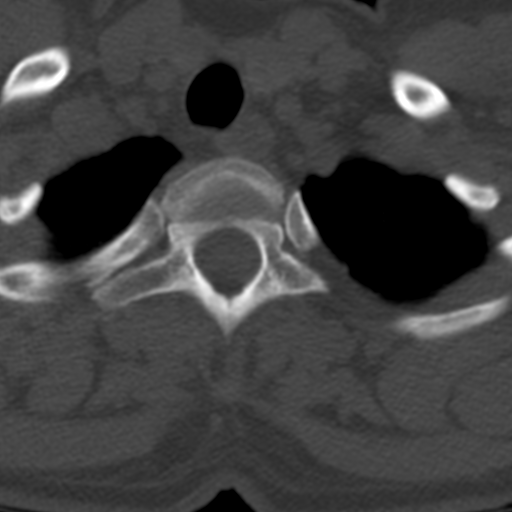

[Series 7: spine 2.0 coronal st · coronal · 0.38mm/px · 3 of 72 slices shown]
[im 15/72  bone]
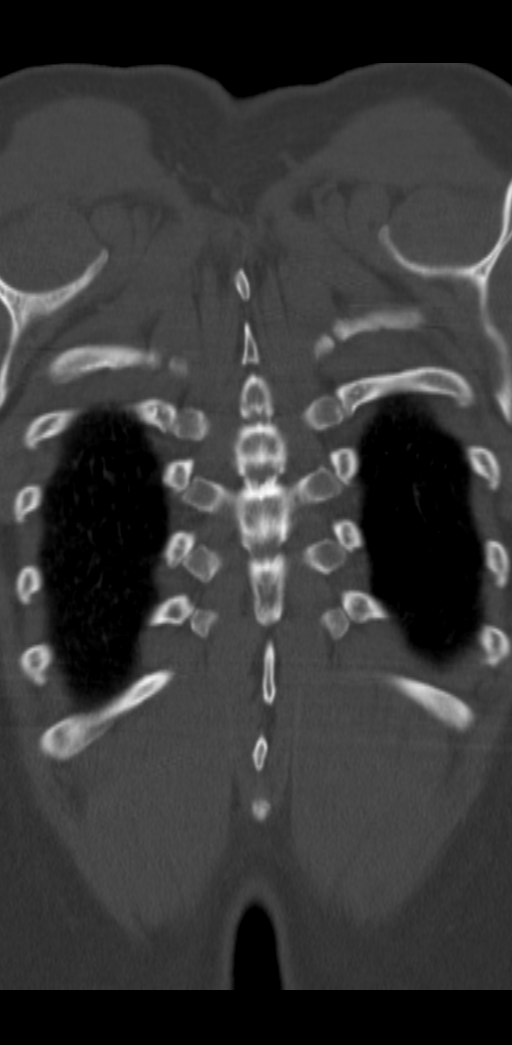
[im 29/72  bone]
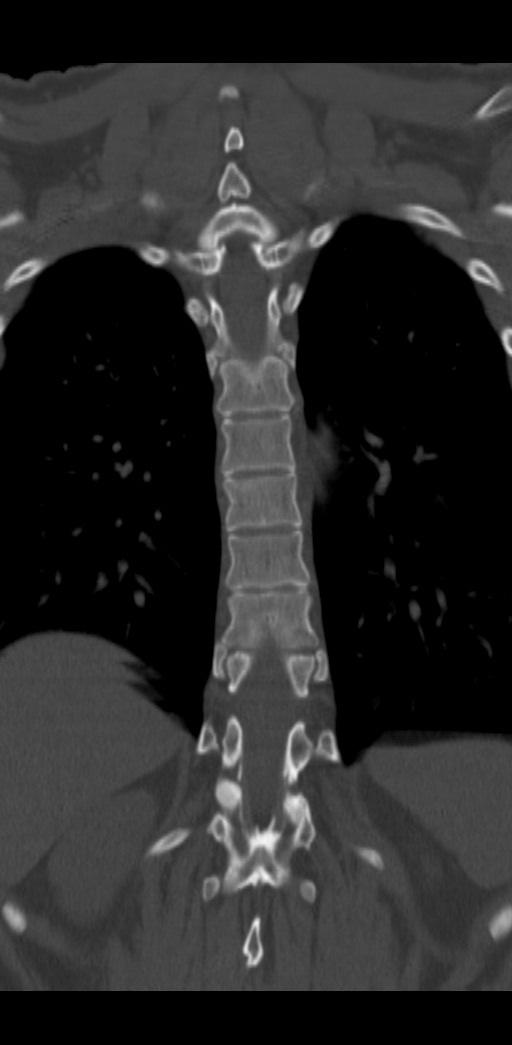
[im 43/72  bone]
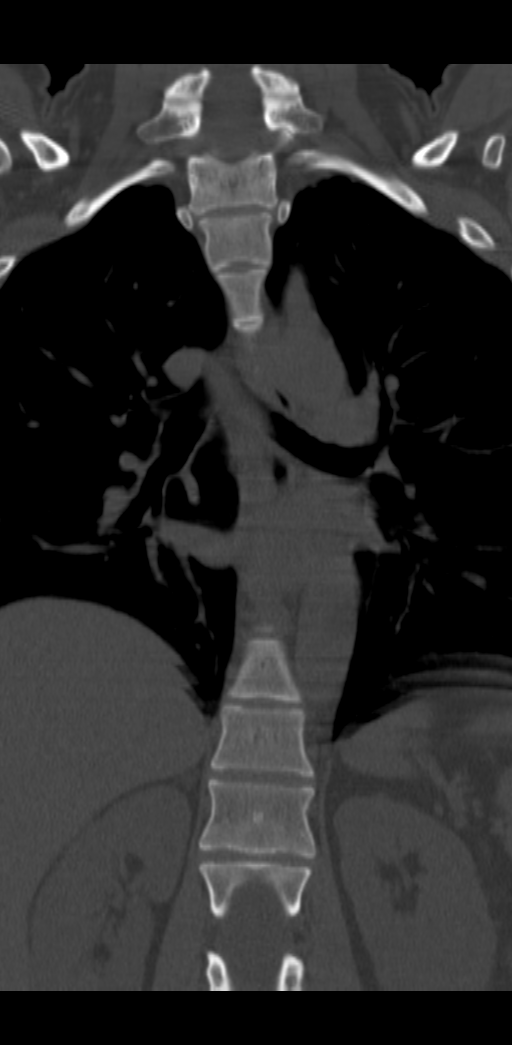

[Series 8: spine 2.0 sagittal st · sagittal · 0.45mm/px · 5 of 57 slices shown, 6 images]
[im 19/57  bone]
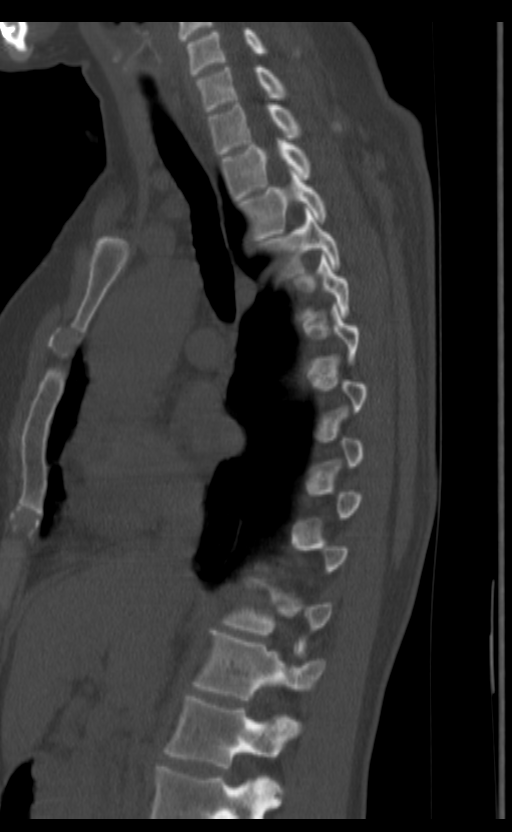
[im 24/57  bone]
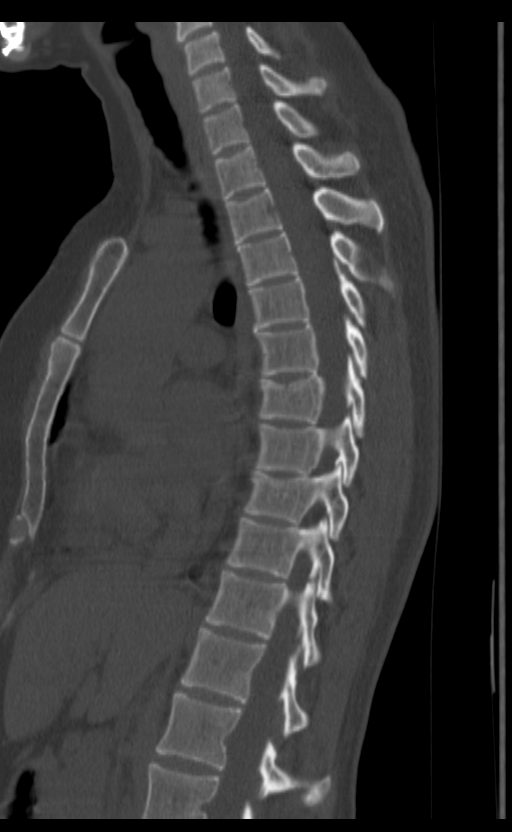
[im 29/57  soft-tissue]
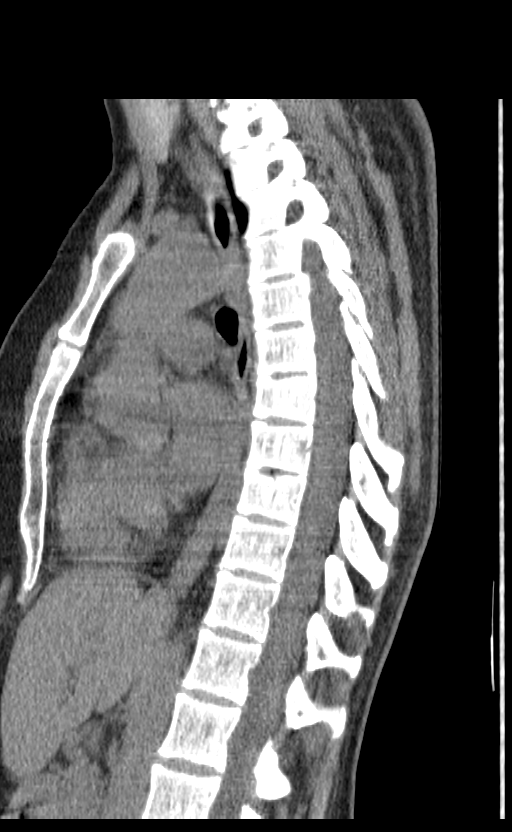
[im 29/57  bone]
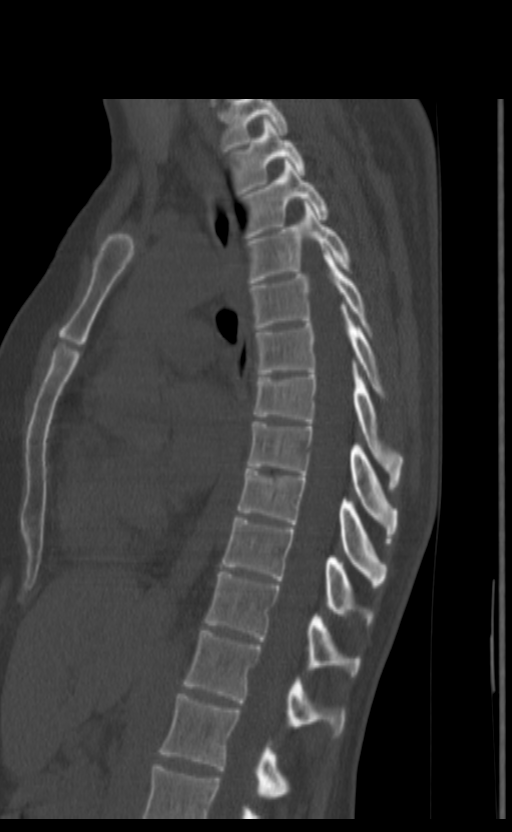
[im 33/57  bone]
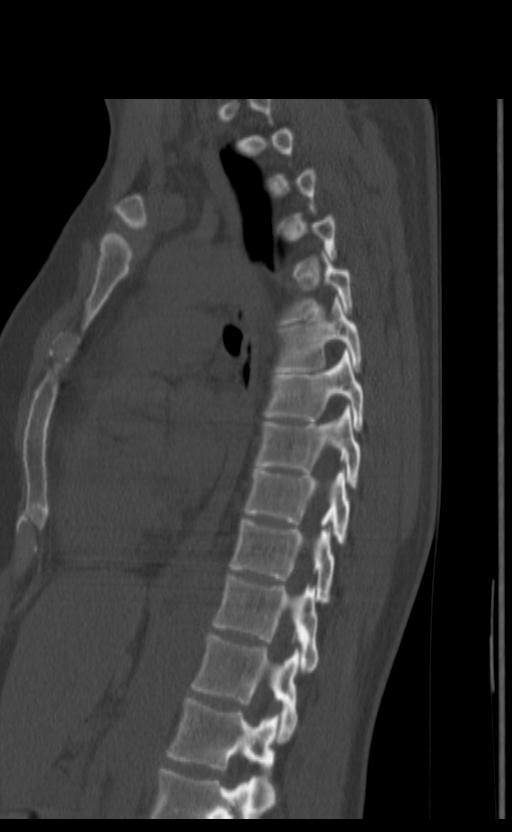
[im 38/57  bone]
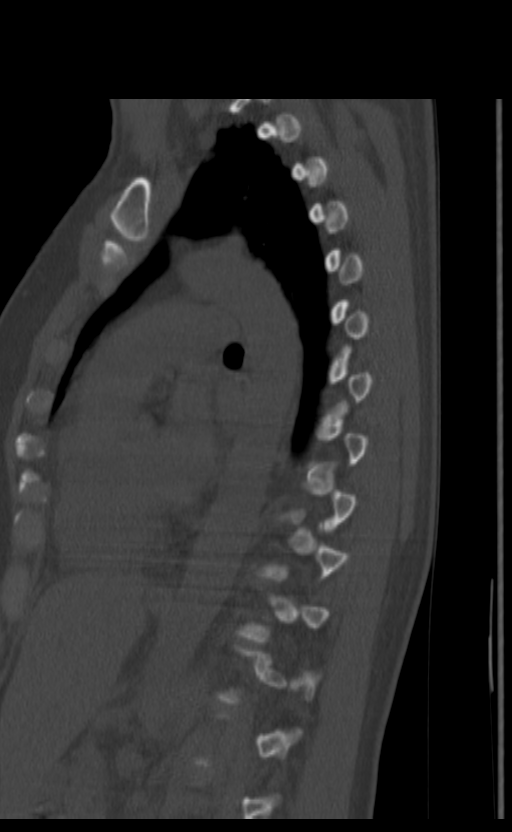

[11 of 33 positions shown; findings below may reference images not displayed]

FINDINGS: Age indeterminant anterior compression fractures of T7
and T9.  T9 demonstrates a superior endplate compression fracture
with less than 25% loss of vertebral body height.  T7 shows
biconcave compression fractures, more prominent along the superior
endplate.  The T7 compression fracture is more convincing for acute
fracture based on end plate sclerosis suggesting impaction.
Scattered Schmorl's nodes are present at other levels.  There is no
retropulsion at either level.  Mild levoconvex curvature.  Facet
joints appear within normal limits.  No epidural hematoma.  Sternum
appears intact.  Visualized lungs show dependent atelectasis.
Central airway is patent.
IMPRESSION: Age indeterminant T7 and T9 compression fractures with less than
25% loss vertebral body height and no retropulsion.  The T7
fracture has features that suggest acute or subacute time course
while the T9 fracture remains indeterminant. T9 and may well
represent a Schmorl's node as the disc is degenerated and vacuum
disc is present at T8-T9.  MRI was suggested previously which would
allow for assessment of marrow edema to determine the acuity of the
fractures.

## 2014-03-04 IMAGING — CR DG CERVICAL SPINE COMPLETE 4+V
5 series · 5 of 5 positions shown · non-contrast
Comparison: CT 03/18/2009.

CLINICAL DATA: Fall.  Neck pain.

CERVICAL SPINE - COMPLETE 4+ VIEW

[w c-spine lat]
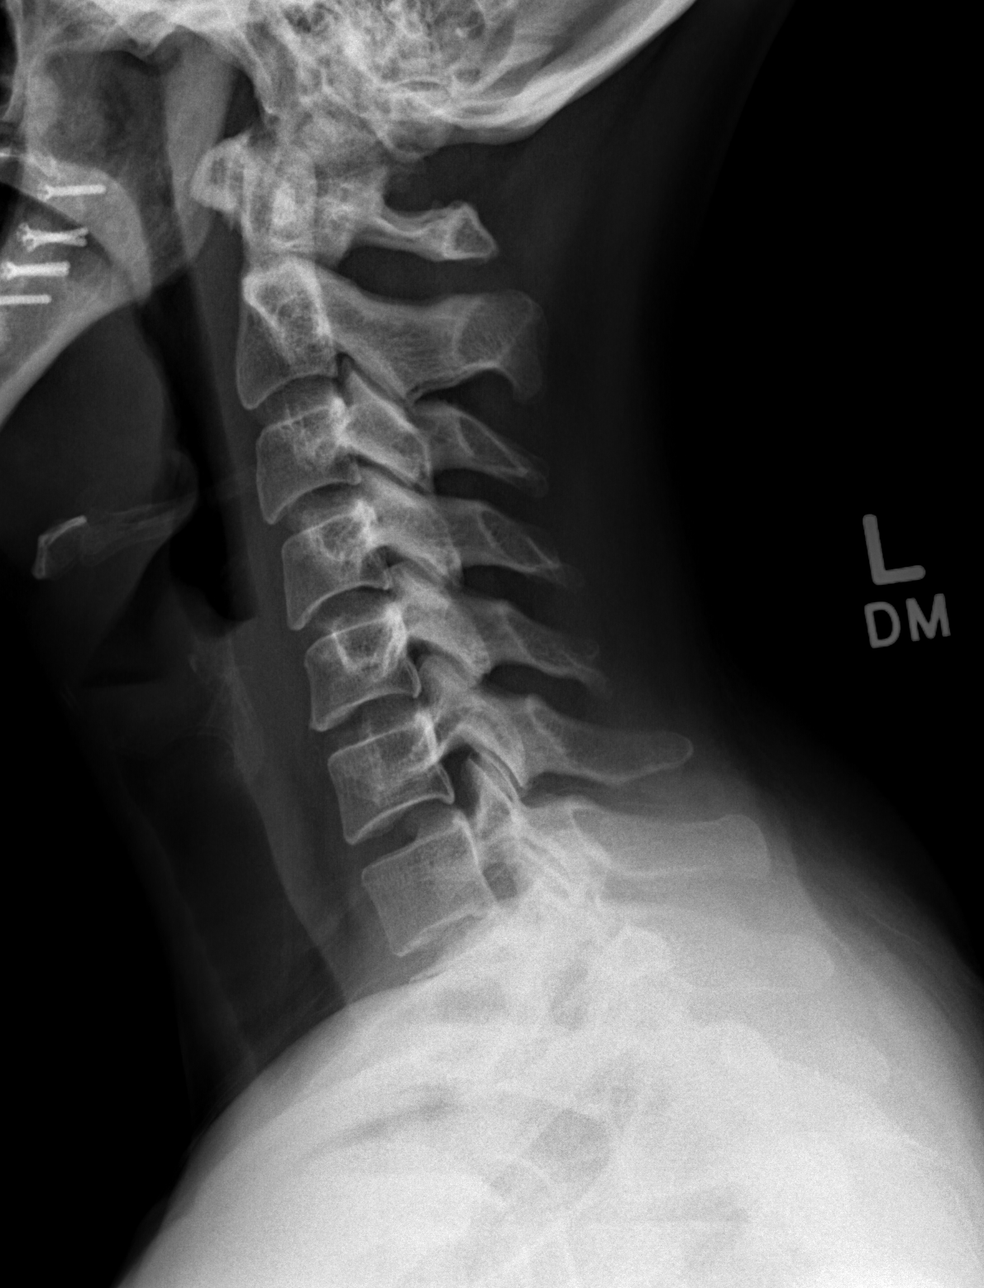

[w c-spine oblique (1 of 2)]
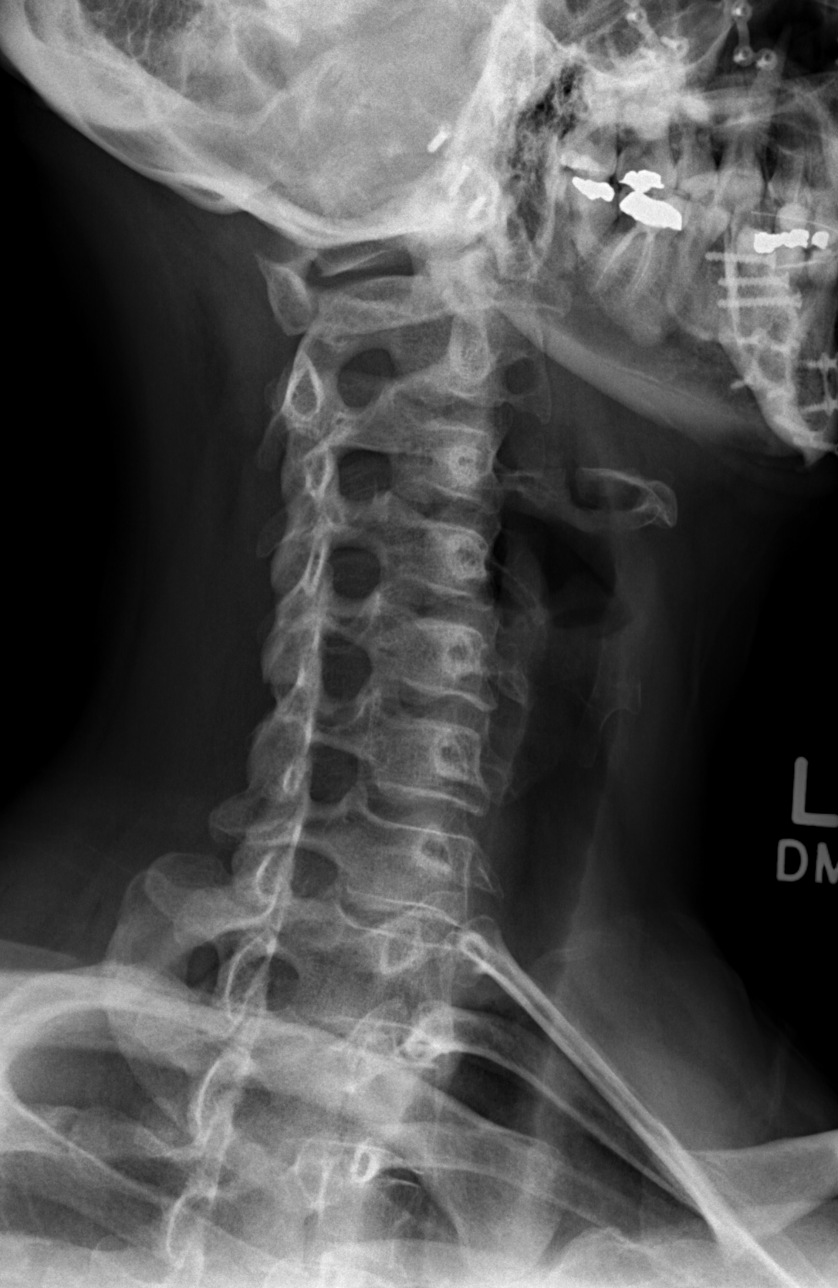

[w c-spine oblique (2 of 2)]
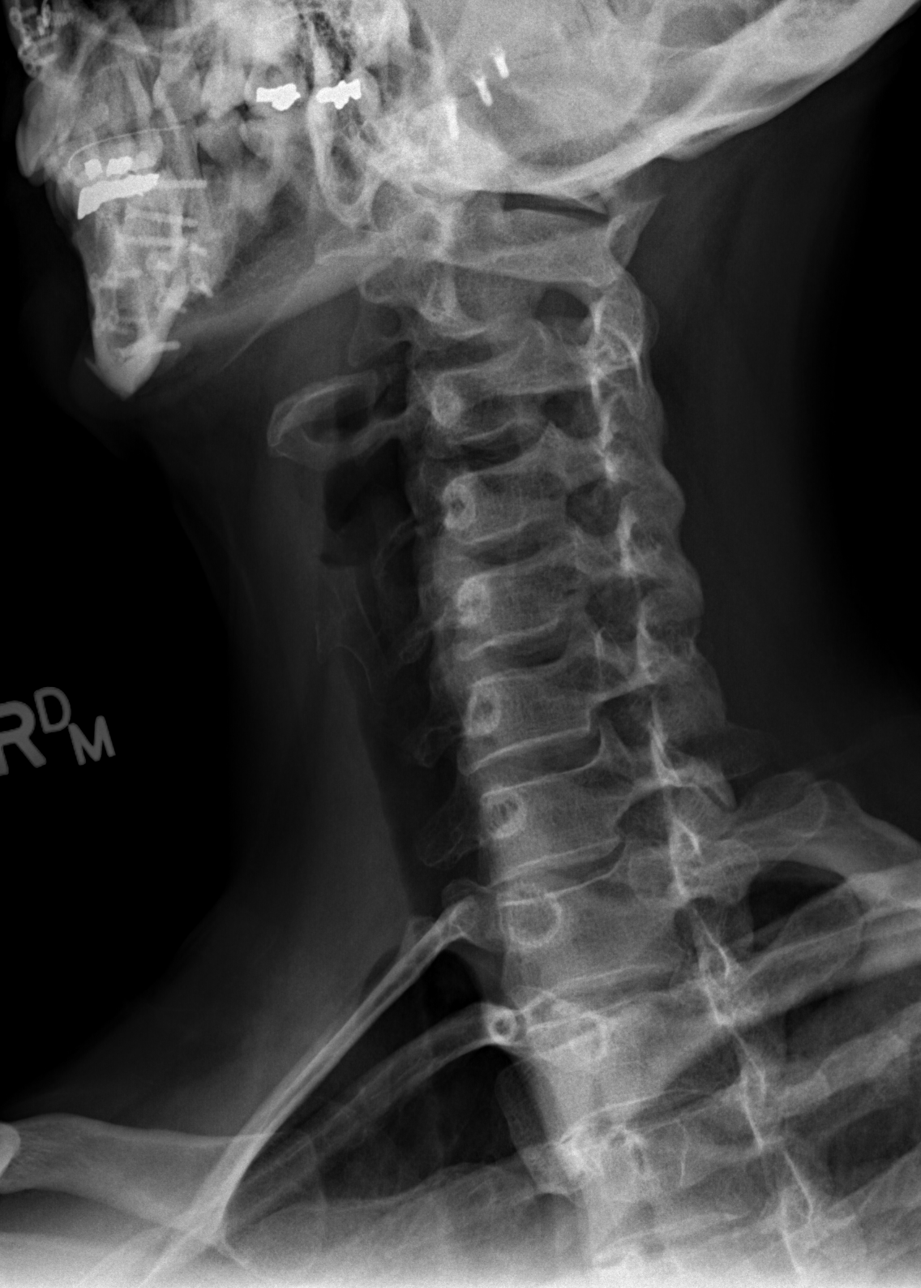

[w c-spine a.p.]
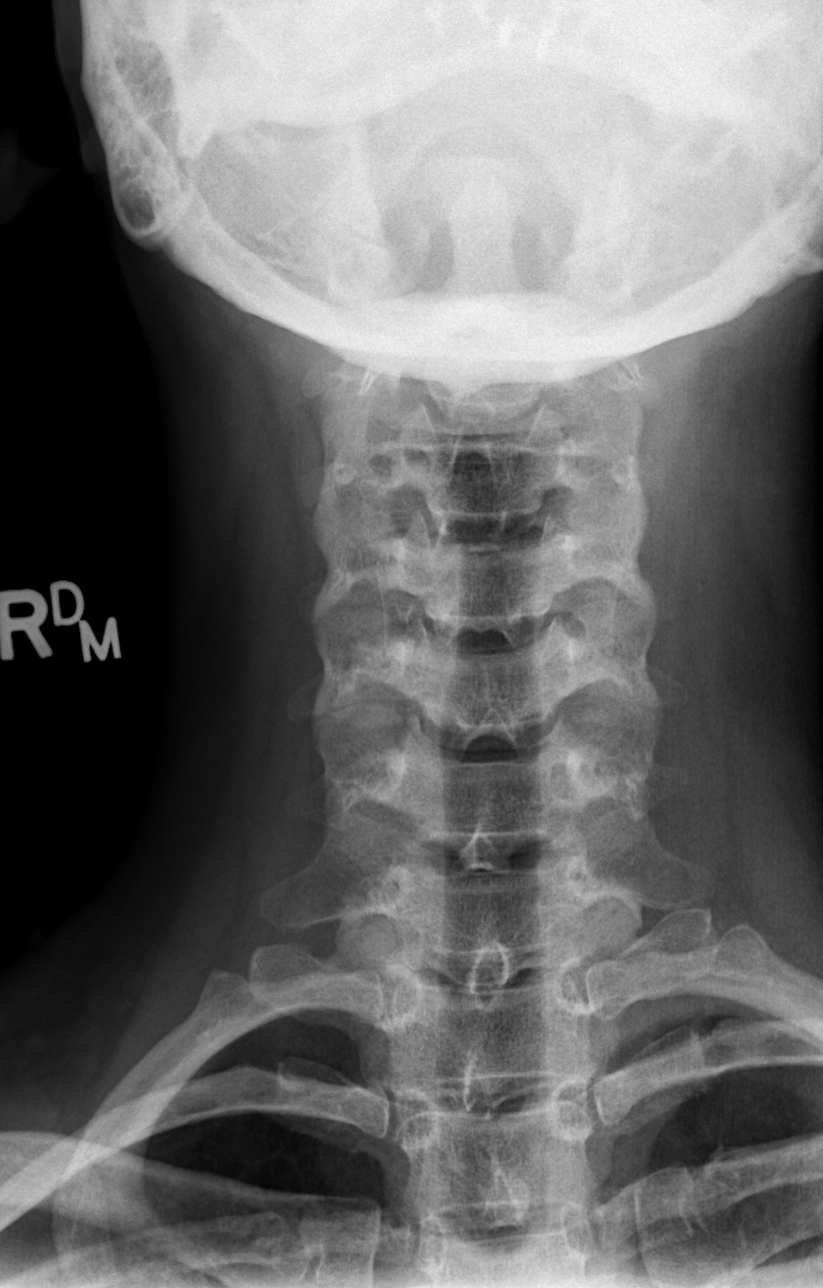

[w c-spine odontoid]
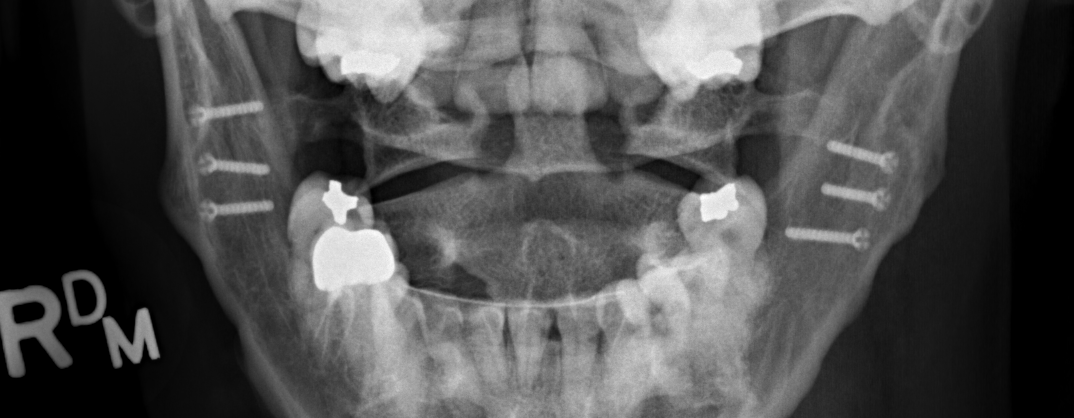

[5 of 5 positions shown; findings below may reference images not displayed]

FINDINGS: Anatomic alignment.  No fracture.  Soft tissues appear
within normal limits.  The mandibular screws are present.
Craniocervical junction and cervicothoracic junction visualized and
normal.  Prevertebral soft tissues normal.
IMPRESSION: Negative.

## 2014-03-04 IMAGING — CR DG THORACIC SPINE 2V
3 series · 3 of 3 positions shown · non-contrast
Comparison: 05/23/2012.

CLINICAL DATA: Fall.  Back pain.

THORACIC SPINE - 2 VIEW

[w t-spine a.p. *]
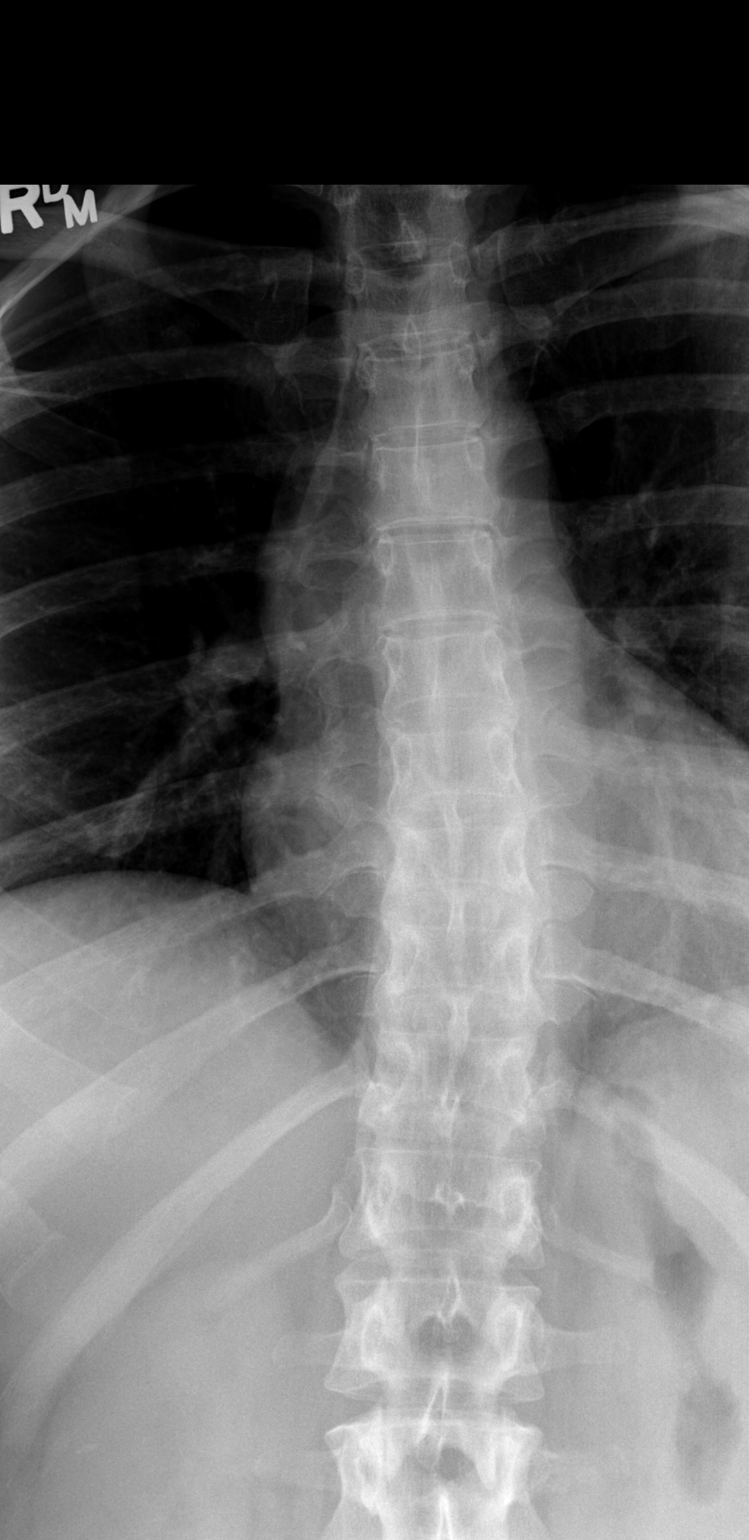

[w t-spine lat *]
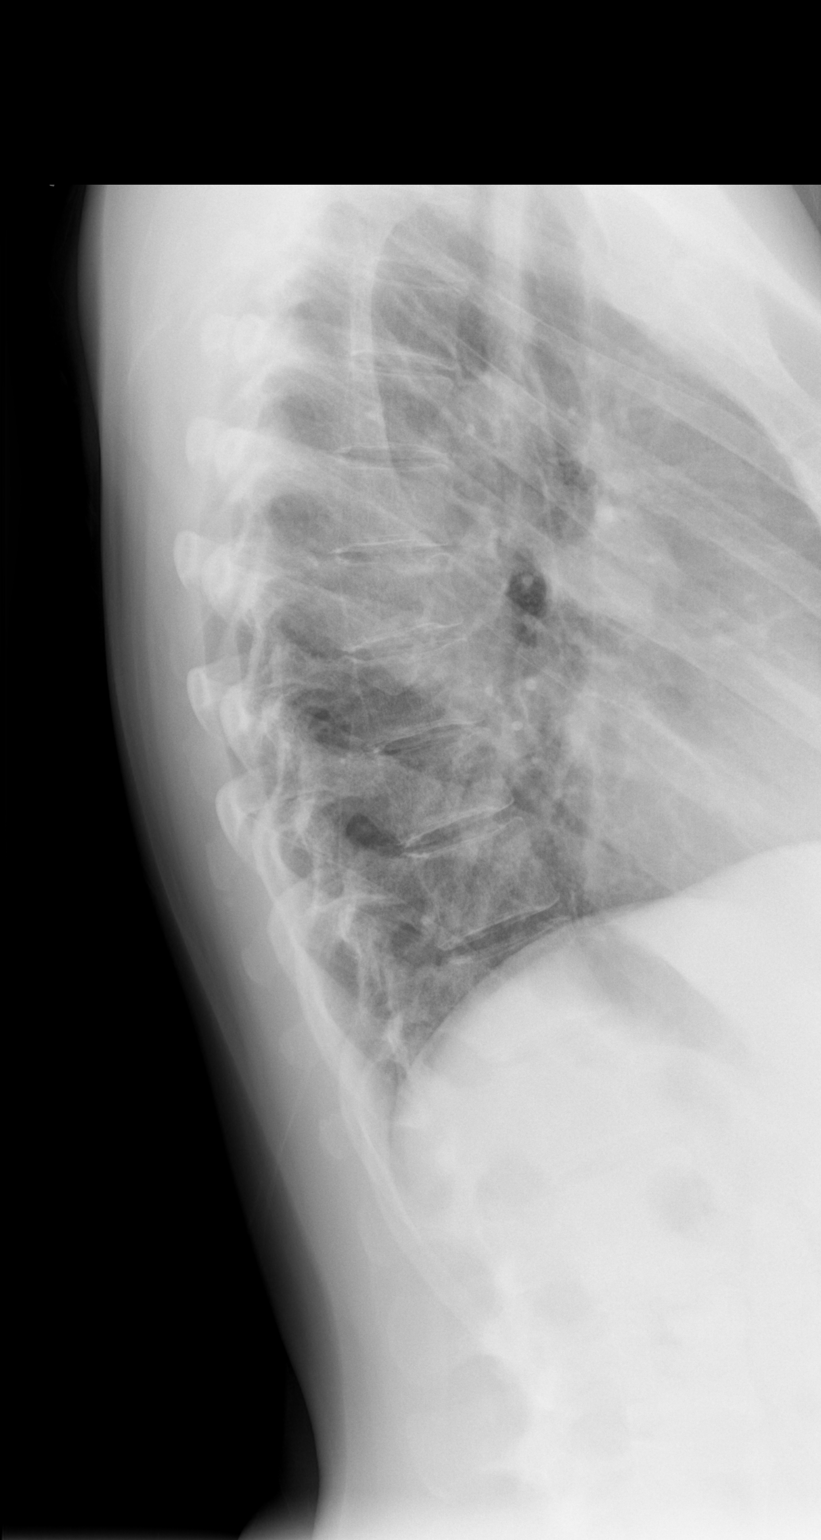

[w swimmers view]
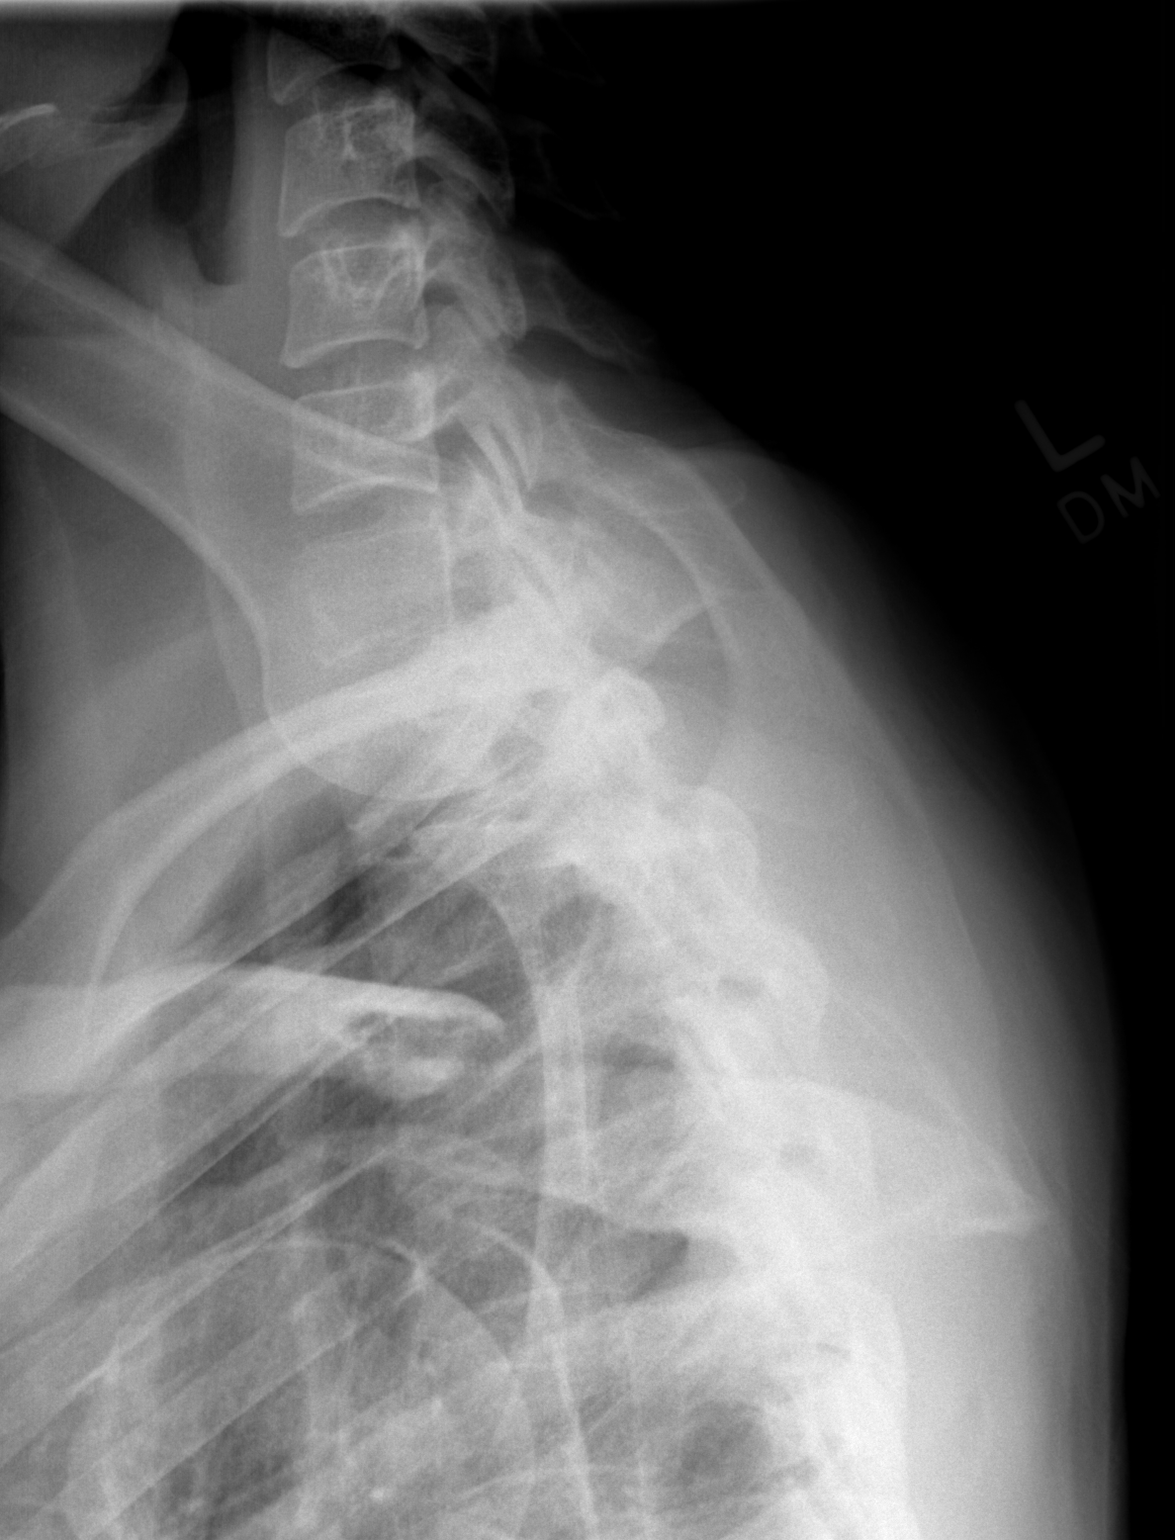

[3 of 3 positions shown; findings below may reference images not displayed]

FINDINGS: There appears to be depression of the superior T7
endplate, with cortical irregularity anteriorly.  Findings suggest
an acute or subacute compression fracture in this patient with
history of recent trauma.  Other vertebral bodies appear within
normal limits.  There is no radiographic evidence of retropulsion.

Vertebral body height loss is less than 25%. On the frontal view,
there may be some thickening of the paraspinal line on the left.
IMPRESSION: Findings suspicious for T7 mild compression fracture with loss
vertebral body height less than 25% and no retropulsion. MRI may be
useful in further evaluation to confirm the fracture and evaluate
the age.

## 2014-04-10 ENCOUNTER — Ambulatory Visit (INDEPENDENT_AMBULATORY_CARE_PROVIDER_SITE_OTHER): Payer: Medicaid Other | Admitting: Psychiatry

## 2014-04-10 ENCOUNTER — Encounter (HOSPITAL_COMMUNITY): Payer: Self-pay | Admitting: Psychiatry

## 2014-04-10 VITALS — BP 125/80 | HR 77 | Ht 66.0 in | Wt 125.2 lb

## 2014-04-10 DIAGNOSIS — F410 Panic disorder [episodic paroxysmal anxiety] without agoraphobia: Secondary | ICD-10-CM

## 2014-04-10 DIAGNOSIS — G4700 Insomnia, unspecified: Secondary | ICD-10-CM

## 2014-04-10 DIAGNOSIS — F411 Generalized anxiety disorder: Secondary | ICD-10-CM

## 2014-04-10 DIAGNOSIS — F332 Major depressive disorder, recurrent severe without psychotic features: Secondary | ICD-10-CM

## 2014-04-10 MED ORDER — LORAZEPAM 1 MG PO TABS: 1.0000 mg | ORAL_TABLET | Freq: Three times a day (TID) | ORAL | Status: DC | PRN

## 2014-04-10 MED ORDER — ARIPIPRAZOLE 5 MG PO TABS: 5.0000 mg | ORAL_TABLET | Freq: Every day | ORAL | Status: DC

## 2014-04-10 MED ORDER — ZOLPIDEM TARTRATE 5 MG PO TABS: 5.0000 mg | ORAL_TABLET | Freq: Every evening | ORAL | Status: DC | PRN

## 2014-04-10 MED ORDER — BUPROPION HCL ER (XL) 150 MG PO TB24: 300.0000 mg | ORAL_TABLET | Freq: Every day | ORAL | Status: DC

## 2014-04-10 NOTE — Progress Notes (Signed)
[facility] [address] Progress Note  Brandi Santiago [id number] 43 y.o.  04/10/2014 1:31 PM  Chief Complaint: "its getting a little better"  History of Present Illness: She is having 2-3 good days a week. She is happy, no racing thoughts, sun is shining, not worrying about things. States she should be having more good days.   She feels "blah" on most days. It is not like her to feel this way. Reports anhedonia, low motivation, isolation, crying spells. Denies worthlessness and helplessness. Pt is getitng about 1-2 hrs of sleep per night. Energy is low. Appetite is poor and she is making herself eat. Concentration is poor.   Most days she worries and has racing thoughts. Pt is unable to shut off her mind. Pt reports insomnia and states Vistaril was ineffective. She is having panic attacks a few times a month.  Denies manic and hypomanic symptoms including periods of decreased need for sleep, increased energy, mood lability, impulsivity, FOI, and excessive spending.  Taking Wellbutrin, Ativan and Abilify as prescribed and denies SE.   Suicidal Ideation: No Plan Formed: No Patient has means to carry out plan: No  Homicidal Ideation: No Plan Formed: No Patient has means to carry out plan: No  Review of Systems: Psychiatric: Agitation: Yes Hallucination: No Depressed Mood: Yes Insomnia: Yes Hypersomnia: No Altered Concentration: Yes Feels Worthless: No Grandiose Ideas: No Belief In Special Powers: No New/Increased Substance Abuse: No Compulsions: No  Neurologic: Headache: Yes Seizure: No Paresthesias: No   Review of Systems  Constitutional: Negative for fever and chills.  HENT: Negative for congestion, ear discharge and sore throat.   Eyes: Negative for blurred vision, double vision and redness.  Respiratory: Negative for cough, sputum production and shortness of breath.   Cardiovascular: Negative for chest pain, palpitations and leg swelling.  Gastrointestinal:  Positive for nausea. Negative for heartburn, vomiting and abdominal pain.  Musculoskeletal: Positive for myalgias and joint pain. Negative for back pain and neck pain.  Skin: Negative for itching and rash.  Neurological: Positive for headaches. Negative for dizziness, seizures, loss of consciousness and weakness.  Psychiatric/Behavioral: Positive for depression. Negative for suicidal ideas, hallucinations and substance abuse. The patient is nervous/anxious and has insomnia.      Past Medical Family, Social History: lives in [place] with fiance. Raised by parents, no siblings. Had a great childhood. Pt is divorced x2. She has 2 kids, 1 boy and 1 girl. She is unemployed and last worked Dec 2006.  reports that she has never smoked. She has never used smokeless tobacco. She reports that she drinks alcohol. She reports that she does not use illicit drugs.  Family History  Problem Relation Age of Onset  . Diabetes Paternal Grandmother   . Diabetes Paternal Grandfather   . Clotting disorder Father   . Cancer Father   . Colon cancer Father   . Suicidality Neg Hx   . Anxiety disorder Neg Hx   . Bipolar disorder Neg Hx   . Depression Neg Hx   . ADD / ADHD Son     Past Medical History  Diagnosis Date  . Irritable bowel syndrome   . Depressive disorder, not elsewhere classified   . Anxiety state, unspecified   . Esophageal reflux   . Ulcerative (chronic) proctitis   . MS (multiple sclerosis)   . Barrett esophagus   . Allergy   . Anemia   . Hiatal hernia   . Iron deficiency anemia, unspecified   . Kidney infection   .  Lupus   . RA (rheumatoid arthritis)   . Tachycardia   . Fibromyalgia   . Hx of cardiovascular stress test     ETT-Myoview (11/2013):  Apical inferior reversibility (diaph atten vs very slight ischemia), normal wall motion, EF 57%; low risk     Outpatient Encounter Prescriptions as of 04/10/2014  Medication Sig  . ARIPiprazole (ABILIFY) 5 MG tablet Take 1 tablet (5  mg total) by mouth daily.  Marland Kitchen. buPROPion (WELLBUTRIN XL) 150 MG 24 hr tablet Take 2 tablets (300 mg total) by mouth daily.  . cetirizine (ZYRTEC) 10 MG tablet Take 10 mg by mouth daily.  Marland Kitchen. esomeprazole (NEXIUM) 40 MG capsule Take 40 mg by mouth daily before breakfast.  . fluticasone (FLONASE) 50 MCG/ACT nasal spray Place 2 sprays into both nostrils daily.  . hydrocortisone valerate ointment (WESTCORT) 0.2 % Apply topically 2 (two) times daily.  Marland Kitchen. LORazepam (ATIVAN) 1 MG tablet Take 1 tablet (1 mg total) by mouth 3 (three) times daily as needed for anxiety.  . metoprolol succinate (TOPROL-XL) 25 MG 24 hr tablet Take 3 tablets (75 mg total) by mouth daily.  . ondansetron (ZOFRAN) 4 MG tablet Take 2 tablets (8 mg total) by mouth every 8 (eight) hours as needed for nausea.  Marland Kitchen. oxyCODONE-acetaminophen (PERCOCET) 7.5-325 MG per tablet Take 1 tablet by mouth every 4 (four) hours as needed for pain.  Marland Kitchen. topiramate (TOPAMAX) 25 MG tablet Take 25 mg by mouth 3 (three) times daily.  . hydrOXYzine (VISTARIL) 25 MG capsule Take 25-50mg  po qHS prn insomnia (Patient not taking: Reported on 04/10/2014)    Past Psychiatric History/Hospitalization(s): Anxiety: Yes Bipolar Disorder: Yes Depression: Yes Mania: No Psychosis: No Schizophrenia: No Personality Disorder: No Hospitalization for psychiatric illness: Yes History of Electroconvulsive Shock Therapy: No Prior Suicide Attempts: No  Physical Exam: Constitutional:  BP 125/80 mmHg  Pulse 77  Ht 5\' 6"  (1.676 m)  Wt 125 lb 3.2 oz (56.79 kg)  BMI 20.22 kg/m2  General Appearance: alert, oriented, no acute distress  Musculoskeletal: Strength & Muscle Tone: within normal limits Gait & Station: normal Patient leans: N/A  Mental Status Examination/Evaluation: Objective: Attitude: Calm and cooperative  Appearance: Fairly Groomed, appears to be stated age  Patent attorneyye Contact::  Fair  Speech:  Clear and Coherent and Normal Rate  Volume:  Normal  Mood:   depressed  Affect:  irritable  Thought Process:  Linear and Logical  Orientation:  Full (Time, Place, and Person)  Thought Content:  Negative  Suicidal Thoughts:  No  Homicidal Thoughts:  No  Judgement:  Intact  Insight:  Fair  Concentration: good  Memory: Immediate-fair Recent-fair Remote-fair  Recall: fair  Language: fair  Gait and Station: normal  [employer] of Knowledge: average  Psychomotor Activity:  Normal  Akathisia:  No  Handed:  Right  AIMS (if indicated):  Facial and Oral Movements  Muscles of Facial Expression: None, normal  Lips and Perioral Area: None, normal  Jaw: None, normal  Tongue: None, normal Extremity Movements: Upper (arms, wrists, hands, fingers): None, normal  Lower (legs, knees, ankles, toes): None, normal,  Trunk Movements:  Neck, shoulders, hips: None, normal,  Overall Severity : Severity of abnormal movements (highest score from questions above): None, normal  Incapacitation due to abnormal movements: None, normal  Patient's awareness of abnormal movements (rate only patient's report): No Awareness, Dental Status  Current problems with teeth and/or dentures?: No  Does patient usually wear dentures?: No    Assets:  Communication Skills  Desire for Improvement Financial Resources/Insurance Housing Intimacy Leisure Time Social Support TEFL teacher (Choose Three): Established Problem, Stable/Improving (1), Review of Psycho-Social Stressors (1), Review of Medication Regimen & Side Effects (2) and Review of New Medication or Change in Dosage (2)  Assessment: AXIS I MDD- recurrent, severe without psychotic features. R/o Bipolar II. GAD. 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   AXIS II Deferred  AXIS III Past Medical History  Diagnosis Date  . Irritable bowel syndrome   . Depressive disorder, not elsewhere classified   . Anxiety state, unspecified   . Esophageal reflux    . Ulcerative (chronic) proctitis   . MS (multiple sclerosis)   . Barrett esophagus   . Allergy   . Anemia   . Hiatal hernia   . Iron deficiency anemia, unspecified   . Kidney infection   . Lupus   . RA (rheumatoid arthritis)   . Tachycardia   . Fibromyalgia   . Hx of cardiovascular stress test     ETT-Myoview (11/2013): Apical inferior reversibility (diaph atten vs very slight ischemia), normal wall motion, EF 57%; low risk     AXIS IV other psychosocial or environmental problems  AXIS V 51-60 moderate symptoms   Treatment Plan/Recommendations:  Plan of Care: Medication management with supportive therapy. Risks/benefits and SE of the medication discussed. Pt verbalized understanding and verbal consent obtained for treatment. Affirm with the patient that the medications are taken as ordered. Patient expressed understanding of how their medications were to be used.   Confidentiality and exclusions reviewed with pt who verbalized understanding.   Laboratory: none today  Psychotherapy: Therapy: brief supportive therapy provided. Discussed psychosocial stressors in detail.    Medications: Continue Wellbutrin XL 300mg  po qD for depression Continue Ativan 1mg  po TID prn anxiety Increase Abilify 10mg  po qD for as adjunct treatment for depression Start trial of Ambien 5mg  po qHS prn insomnia D/c Vistaril  Routine PRN Medications: Yes  Consultations: refer to therapist   Safety Concerns: Pt denies SI and is at an acute low risk for suicide.Patient told to call clinic if any problems occur. Patient advised to go to ER if they should develop SI/HI, side effects, or if symptoms worsen. Has crisis numbers to call if needed. Pt verbalized understanding.   Other: F/up in 2 months or sooner if needed    Oletta Darter, MD 04/10/2014

## 2014-04-12 ENCOUNTER — Ambulatory Visit (HOSPITAL_COMMUNITY): Payer: Self-pay | Admitting: Clinical

## 2014-04-18 ENCOUNTER — Encounter (HOSPITAL_COMMUNITY): Payer: Self-pay | Admitting: Clinical

## 2014-04-18 ENCOUNTER — Ambulatory Visit (INDEPENDENT_AMBULATORY_CARE_PROVIDER_SITE_OTHER): Payer: Medicaid Other | Admitting: Clinical

## 2014-04-18 DIAGNOSIS — F411 Generalized anxiety disorder: Secondary | ICD-10-CM

## 2014-04-18 DIAGNOSIS — F331 Major depressive disorder, recurrent, moderate: Secondary | ICD-10-CM
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
## 2014-05-01 ENCOUNTER — Ambulatory Visit (HOSPITAL_COMMUNITY): Payer: Self-pay | Admitting: Clinical

## 2014-06-12 ENCOUNTER — Encounter (HOSPITAL_COMMUNITY): Payer: Self-pay | Admitting: Psychiatry

## 2014-06-12 ENCOUNTER — Ambulatory Visit (INDEPENDENT_AMBULATORY_CARE_PROVIDER_SITE_OTHER): Payer: Medicaid Other | Admitting: Psychiatry

## 2014-06-12 VITALS — BP 102/67 | HR 57 | Ht 66.5 in | Wt 119.8 lb

## 2014-06-12 DIAGNOSIS — G4700 Insomnia, unspecified: Secondary | ICD-10-CM

## 2014-06-12 DIAGNOSIS — F411 Generalized anxiety disorder: Secondary | ICD-10-CM

## 2014-06-12 DIAGNOSIS — F410 Panic disorder [episodic paroxysmal anxiety] without agoraphobia: Secondary | ICD-10-CM

## 2014-06-12 DIAGNOSIS — F332 Major depressive disorder, recurrent severe without psychotic features: Secondary | ICD-10-CM

## 2014-06-12 MED ORDER — BUPROPION HCL ER (XL) 150 MG PO TB24: 300.0000 mg | ORAL_TABLET | Freq: Every day | ORAL | Status: DC

## 2014-06-12 MED ORDER — LORAZEPAM 1 MG PO TABS: 1.0000 mg | ORAL_TABLET | Freq: Three times a day (TID) | ORAL | Status: DC | PRN

## 2014-06-12 MED ORDER — ARIPIPRAZOLE 15 MG PO TABS: 15.0000 mg | ORAL_TABLET | Freq: Every day | ORAL | Status: DC

## 2014-06-12 MED ORDER — ZOLPIDEM TARTRATE 5 MG PO TABS: 5.0000 mg | ORAL_TABLET | Freq: Every evening | ORAL | Status: DC | PRN

## 2014-06-12 NOTE — Progress Notes (Signed)
[facility] [id number] Progress Note  Brandi Santiago [id number] 44 y.o.  06/12/2014 1:07 PM  Chief Complaint: "doing good"  History of Present Illness: She is having 4 good days a week. She is happy, no racing thoughts, sun is shining, not worrying about things.   The remaining 3 days she feels depressed, restless and anxious (racing thoughts, unable to shut off her mind). Denies having bad thoughts. Reports anhedonia, low motivation, isolation, crying spells occuring but are less intense. Denies worthlessness and helplessness. Pt is variable and she is getitng about 8hrs of sleep per night 3 days a week. Ambien helps some.  Energy is improving. Appetite is good. Concentration is improving.   She is having panic attacks a few times a month. Pt takes Ativan TID.   Denies manic and hypomanic symptoms including periods of decreased need for sleep, increased energy, mood lability, impulsivity, FOI, and excessive spending.  Taking Wellbutrin, Ativan and Abilify as prescribed and denies SE.   Suicidal Ideation: No Plan Formed: No Patient has means to carry out plan: No  Homicidal Ideation: No Plan Formed: No Patient has means to carry out plan: No  Review of Systems: Psychiatric: Agitation: Yes Hallucination: No Depressed Mood: Yes Insomnia: Yes Hypersomnia: No Altered Concentration: Yes Feels Worthless: No Grandiose Ideas: No Belief In Special Powers: No New/Increased Substance Abuse: No Compulsions: No  Neurologic: Headache: Yes Seizure: No Paresthesias: No   Review of Systems  Constitutional: Negative for fever, chills and malaise/fatigue.  HENT: Negative for congestion, nosebleeds and sore throat.   Eyes: Negative for blurred vision, double vision and redness.  Respiratory: Negative for cough, shortness of breath and wheezing.   Cardiovascular: Negative for chest pain, palpitations and leg swelling.  Gastrointestinal: Positive for nausea, abdominal pain and  diarrhea. Negative for heartburn, vomiting and constipation.  Musculoskeletal: Negative for myalgias, back pain, joint pain and neck pain.  Skin: Negative for itching and rash.  Neurological: Positive for headaches.  Psychiatric/Behavioral: Positive for depression. Negative for suicidal ideas, hallucinations and substance abuse. The patient is nervous/anxious and has insomnia.      Past Medical Family, Social History: lives in [place] with fiance. Raised by parents, no siblings. Had a great childhood. Pt is divorced x2. She has 2 kids, 1 boy and 1 girl. She is unemployed and last worked Dec 2006.  reports that she has never smoked. She has never used smokeless tobacco. She reports that she drinks alcohol. She reports that she does not use illicit drugs.  Family History  Problem Relation Age of Onset  . Diabetes Paternal Grandmother   . Diabetes Paternal Grandfather   . Suicidality Neg Hx   . Anxiety disorder Neg Hx   . Bipolar disorder Neg Hx   . ADD / ADHD Son   . Depression Mother     Past Medical History  Diagnosis Date  . Irritable bowel syndrome   . Esophageal reflux   . Ulcerative (chronic) proctitis   . MS (multiple sclerosis)   . Barrett esophagus   . Allergy   . Anemia   . Hiatal hernia   . Iron deficiency anemia, unspecified   . Kidney infection   . Lupus   . RA (rheumatoid arthritis)   . Tachycardia   . Fibromyalgia   . Hx of cardiovascular stress test     ETT-Myoview (11/2013):  Apical inferior reversibility (diaph atten vs very slight ischemia), normal wall motion, EF 57%; low risk  . Anxiety   . Bipolar  disorder   . Depression      Outpatient Encounter Prescriptions as of 06/12/2014  Medication Sig  . ARIPiprazole (ABILIFY) 5 MG tablet Take 1 tablet (5 mg total) by mouth daily.  Marland Kitchen buPROPion (WELLBUTRIN XL) 150 MG 24 hr tablet Take 2 tablets (300 mg total) by mouth daily.  . cetirizine (ZYRTEC) 10 MG tablet Take 10 mg by mouth daily.  Marland Kitchen esomeprazole  (NEXIUM) 40 MG capsule Take 40 mg by mouth daily before breakfast.  . fluticasone (FLONASE) 50 MCG/ACT nasal spray Place 2 sprays into both nostrils daily.  . hydrocortisone valerate ointment (WESTCORT) 0.2 % Apply topically 2 (two) times daily.  Marland Kitchen LORazepam (ATIVAN) 1 MG tablet Take 1 tablet (1 mg total) by mouth 3 (three) times daily as needed for anxiety.  . metoprolol succinate (TOPROL-XL) 25 MG 24 hr tablet Take 3 tablets (75 mg total) by mouth daily.  . ondansetron (ZOFRAN) 4 MG tablet Take 2 tablets (8 mg total) by mouth every 8 (eight) hours as needed for nausea.  Marland Kitchen oxyCODONE-acetaminophen (PERCOCET) 7.5-325 MG per tablet Take 1 tablet by mouth every 4 (four) hours as needed for pain.  Marland Kitchen topiramate (TOPAMAX) 25 MG tablet Take 25 mg by mouth 3 (three) times daily.  Marland Kitchen zolpidem (AMBIEN) 5 MG tablet Take 1 tablet (5 mg total) by mouth at bedtime as needed for sleep.    Past Psychiatric History/Hospitalization(s): Anxiety: Yes Bipolar Disorder: Yes Depression: Yes Mania: No Psychosis: No Schizophrenia: No Personality Disorder: No Hospitalization for psychiatric illness: Yes History of Electroconvulsive Shock Therapy: No Prior Suicide Attempts: No  Physical Exam: Constitutional:  BP 102/67 mmHg  Pulse 57  Ht 5' 6.5" (1.689 m)  Wt 119 lb 12.8 oz (54.341 kg)  BMI 19.05 kg/m2  General Appearance: alert, oriented, no acute distress  Musculoskeletal: Strength & Muscle Tone: within normal limits Gait & Station: normal Patient leans: N/A  Mental Status Examination/Evaluation: Objective: Attitude: Calm and cooperative  Appearance: Fairly Groomed, appears to be stated age  Patent attorney::  Fair  Speech:  Clear and Coherent and Normal Rate  Volume:  Normal  Mood:  depressed  Affect:  Congruent, brighter today than at previous appts  Thought Process:  Linear and Logical  Orientation:  Full (Time, Place, and Person)  Thought Content:  Negative  Suicidal Thoughts:  No  Homicidal  Thoughts:  No  Judgement:  Intact  Insight:  Fair  Concentration: good  Memory: Immediate-fair Recent-fair Remote-fair  Recall: fair  Language: fair  Gait and Station: normal  [employer] of Knowledge: average  Psychomotor Activity:  Normal  Akathisia:  No  Handed:  Right  AIMS (if indicated):  Facial and Oral Movements  Muscles of Facial Expression: None, normal  Lips and Perioral Area: None, normal  Jaw: None, normal  Tongue: None, normal Extremity Movements: Upper (arms, wrists, hands, fingers): None, normal  Lower (legs, knees, ankles, toes): None, normal,  Trunk Movements:  Neck, shoulders, hips: None, normal,  Overall Severity : Severity of abnormal movements (highest score from questions above): None, normal  Incapacitation due to abnormal movements: None, normal  Patient's awareness of abnormal movements (rate only patient's report): No Awareness, Dental Status  Current problems with teeth and/or dentures?: No  Does patient usually wear dentures?: No    Assets:  Communication Skills Desire for Improvement Financial Resources/Insurance Housing Intimacy Leisure Time Social Support TEFL teacher (Choose Three): Established Problem, Stable/Improving (1), Review of Psycho-Social Stressors (  1), Review of Medication Regimen & Side Effects (2) and Review of New Medication or Change in Dosage (2)  Assessment: AXIS I MDD- recurrent, severe without psychotic features. R/o Bipolar II. GAD. 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   AXIS II Deferred  AXIS III Past Medical History  Diagnosis Date  . Irritable bowel syndrome   . Depressive disorder, not elsewhere classified   . Anxiety state, unspecified   . Esophageal reflux   . Ulcerative (chronic) proctitis   . MS (multiple sclerosis)   . Barrett esophagus   . Allergy   . Anemia   . Hiatal hernia   . Iron deficiency anemia,  unspecified   . Kidney infection   . Lupus   . RA (rheumatoid arthritis)   . Tachycardia   . Fibromyalgia   . Hx of cardiovascular stress test     ETT-Myoview (11/2013): Apical inferior reversibility (diaph atten vs very slight ischemia), normal wall motion, EF 57%; low risk     AXIS IV other psychosocial or environmental problems  AXIS V 51-60 moderate symptoms   Treatment Plan/Recommendations:  Plan of Care: Medication management with supportive therapy. Risks/benefits and SE of the medication discussed. Pt verbalized understanding and verbal consent obtained for treatment. Affirm with the patient that the medications are taken as ordered. Patient expressed understanding of how their medications were to be used.   Confidentiality and exclusions reviewed with pt who verbalized understanding.   Laboratory: labs next visit  Psychotherapy: Therapy: brief supportive therapy provided. Discussed psychosocial stressors in detail.    Medications: Continue Wellbutrin XL  po qD for depression Continue Ativan  po TID prn anxiety Increase Abilify  po qD for as adjunct treatment for depression Ambien  po qHS prn insomnia   Routine PRN Medications: Yes  Consultations: refer to therapist   Safety Concerns: Pt denies SI and is at an acute low risk for suicide.Patient told to call clinic if any problems occur. Patient advised to go to ER if they should develop SI/HI, side effects, or if symptoms worsen. Has crisis numbers to call if needed. Pt verbalized understanding.   Other: F/up in 3 months or sooner if needed    Oletta Darter, MD 06/12/2014

## 2014-09-11 ENCOUNTER — Encounter (HOSPITAL_COMMUNITY): Payer: Self-pay | Admitting: Psychiatry

## 2014-09-11 ENCOUNTER — Ambulatory Visit (INDEPENDENT_AMBULATORY_CARE_PROVIDER_SITE_OTHER): Payer: Medicaid Other | Admitting: Psychiatry

## 2014-09-11 VITALS — BP 90/64 | HR 83 | Ht 66.0 in | Wt 131.0 lb

## 2014-09-11 DIAGNOSIS — F332 Major depressive disorder, recurrent severe without psychotic features: Secondary | ICD-10-CM

## 2014-09-11 DIAGNOSIS — F411 Generalized anxiety disorder: Secondary | ICD-10-CM

## 2014-09-11 DIAGNOSIS — G4700 Insomnia, unspecified: Secondary | ICD-10-CM

## 2014-09-11 DIAGNOSIS — F410 Panic disorder [episodic paroxysmal anxiety] without agoraphobia: Secondary | ICD-10-CM

## 2014-09-11 MED ORDER — ZOLPIDEM TARTRATE 10 MG PO TABS: 10.0000 mg | ORAL_TABLET | Freq: Every evening | ORAL | Status: DC | PRN

## 2014-09-11 MED ORDER — ARIPIPRAZOLE 15 MG PO TABS: 15.0000 mg | ORAL_TABLET | Freq: Every day | ORAL | Status: DC

## 2014-09-11 MED ORDER — BUPROPION HCL ER (XL) 150 MG PO TB24: 300.0000 mg | ORAL_TABLET | Freq: Every day | ORAL | Status: DC

## 2014-09-11 MED ORDER — LORAZEPAM 1 MG PO TABS: 1.0000 mg | ORAL_TABLET | Freq: Three times a day (TID) | ORAL | Status: DC | PRN

## 2014-09-11 NOTE — Progress Notes (Signed)
[facility] [id number] Progress Note  TIMECA VALIDO [id number] 44 y.o.  09/11/2014 1:26 PM  Chief Complaint: "depression is better but anxiety is worse"  History of Present Illness: Anxiety is high. Daughter has a strep and possibly mono. Pt is very worried about her. Reports racing thoughts and insomnia.   2 days a week she feels depressed, restless and anxious (racing thoughts, unable to shut off her mind). Reports symptoms of anhedonia, low motivation, isolation, crying spells are less intense. Denies worthlessness and helplessness.   Pt is gettitng about 6-7hrs of sleep per night. Ambien helps some but she feels an increase in dose would help her sleep better.  Energy, appetite and concentration are so/so.  She is having panic attacks about twice a week. Pt takes Ativan TID. It helps to calm her down.   Denies manic and hypomanic symptoms including periods of decreased need for sleep, increased energy, mood lability, impulsivity, FOI, and excessive spending.  Taking Wellbutrin, Ativan and Abilify as prescribed and denies SE.   Suicidal Ideation: No Plan Formed: No Patient has means to carry out plan: No  Homicidal Ideation: No Plan Formed: No Patient has means to carry out plan: No  Review of Systems: Psychiatric: Agitation: Yes Hallucination: No Depressed Mood: Yes Insomnia: Yes Hypersomnia: No Altered Concentration: Yes Feels Worthless: No Grandiose Ideas: No Belief In Special Powers: No New/Increased Substance Abuse: No Compulsions: No  Neurologic: Headache: Yes Seizure: No Paresthesias: No   Review of Systems  Constitutional: Negative for fever, chills and weight loss.  HENT: Negative for congestion, ear pain, nosebleeds and sore throat.   Eyes: Positive for redness. Negative for blurred vision, double vision and pain.  Respiratory: Negative for cough, sputum production and shortness of breath.   Cardiovascular: Negative for chest pain, palpitations  and leg swelling.  Gastrointestinal: Negative for heartburn, nausea, vomiting and abdominal pain.  Musculoskeletal: Negative for back pain, joint pain and neck pain.  Skin: Negative for itching and rash.  Neurological: Positive for headaches. Negative for dizziness, sensory change, seizures and loss of consciousness.  Psychiatric/Behavioral: Positive for depression. Negative for suicidal ideas, hallucinations and substance abuse. The patient is nervous/anxious and has insomnia.      Past Medical Family, Social History: lives in [place] with fiance. Raised by parents, no siblings. Had a great childhood. Pt is divorced x2. She has 2 kids- 1 boy and 1 girl. She is unemployed and last worked Dec 2006.  reports that she has never smoked. She has never used smokeless tobacco. She reports that she drinks alcohol. She reports that she does not use illicit drugs.  Family History  Problem Relation Age of Onset  . Diabetes Paternal Grandmother   . Diabetes Paternal Grandfather   . Suicidality Neg Hx   . Anxiety disorder Neg Hx   . Bipolar disorder Neg Hx   . ADD / ADHD Son   . Depression Mother     Past Medical History  Diagnosis Date  . Irritable bowel syndrome   . Esophageal reflux   . Ulcerative (chronic) proctitis   . MS (multiple sclerosis)   . Barrett esophagus   . Allergy   . Anemia   . Hiatal hernia   . Iron deficiency anemia, unspecified   . Kidney infection   . Lupus   . RA (rheumatoid arthritis)   . Tachycardia   . Fibromyalgia   . Hx of cardiovascular stress test     ETT-Myoview (11/2013):  Apical inferior reversibility (diaph  atten vs very slight ischemia), normal wall motion, EF 57%; low risk  . Anxiety   . Bipolar disorder   . Depression      Outpatient Encounter Prescriptions as of 09/11/2014  Medication Sig  . ARIPiprazole (ABILIFY) 15 MG tablet Take 1 tablet (15 mg total) by mouth daily.  Marland Kitchen buPROPion (WELLBUTRIN XL) 150 MG 24 hr tablet Take 2 tablets (300 mg  total) by mouth daily.  . cetirizine (ZYRTEC) 10 MG tablet Take 10 mg by mouth daily.  Marland Kitchen esomeprazole (NEXIUM) 40 MG capsule Take 40 mg by mouth daily before breakfast.  . fluticasone (FLONASE) 50 MCG/ACT nasal spray Place 2 sprays into both nostrils daily.  . hydrocortisone valerate ointment (WESTCORT) 0.2 % Apply topically 2 (two) times daily.  Marland Kitchen LORazepam (ATIVAN) 1 MG tablet Take 1 tablet (1 mg total) by mouth 3 (three) times daily as needed for anxiety.  . metoprolol succinate (TOPROL-XL) 25 MG 24 hr tablet Take 3 tablets (75 mg total) by mouth daily.  . ondansetron (ZOFRAN) 4 MG tablet Take 2 tablets (8 mg total) by mouth every 8 (eight) hours as needed for nausea.  Marland Kitchen oxyCODONE-acetaminophen (PERCOCET) 7.5-325 MG per tablet Take 1 tablet by mouth every 4 (four) hours as needed for pain.  Marland Kitchen topiramate (TOPAMAX) 25 MG tablet Take 25 mg by mouth 3 (three) times daily.  Marland Kitchen zolpidem (AMBIEN) 5 MG tablet Take 1 tablet (5 mg total) by mouth at bedtime as needed for sleep.   No facility-administered encounter medications on file as of 09/11/2014.    Past Psychiatric History/Hospitalization(s): Anxiety: Yes Bipolar Disorder: Yes Depression: Yes Mania: No Psychosis: No Schizophrenia: No Personality Disorder: No Hospitalization for psychiatric illness: Yes History of Electroconvulsive Shock Therapy: No Prior Suicide Attempts: No  Physical Exam: Constitutional:  BP 90/64 mmHg  Pulse 83  Ht  (1.676 m)  Wt 131 lb (59.421 kg)  BMI 21.15 kg/m2  General Appearance: alert, oriented, no acute distress  Musculoskeletal: Strength & Muscle Tone: within normal limits Gait & Station: normal Patient leans: N/A  Mental Status Examination/Evaluation: Objective: Attitude: Calm and cooperative  Appearance: Fairly Groomed, appears to be stated age  Patent attorney::  Fair  Speech:  Clear and Coherent and Normal Rate  Volume:  Normal  Mood:  depressed  Affect:  Congruent, brighter today than  at previous appts  Thought Process:  Linear and Logical  Orientation:  Full (Time, Place, and Person)  Thought Content:  Negative  Suicidal Thoughts:  No  Homicidal Thoughts:  No  Judgement:  Intact  Insight:  Fair  Concentration: good  Memory: Immediate-fair Recent-fair Remote-fair  Recall: fair  Language: fair  Gait and Station: normal  [employer] of Knowledge: average  Psychomotor Activity:  Normal  Akathisia:  No  Handed:  Right  AIMS (if indicated):  Facial and Oral Movements  Muscles of Facial Expression: None, normal  Lips and Perioral Area: None, normal  Jaw: None, normal  Tongue: None, normal Extremity Movements: Upper (arms, wrists, hands, fingers): None, normal  Lower (legs, knees, ankles, toes): None, normal,  Trunk Movements:  Neck, shoulders, hips: None, normal,  Overall Severity : Severity of abnormal movements (highest score from questions above): None, normal  Incapacitation due to abnormal movements: None, normal  Patient's awareness of abnormal movements (rate only patient's report): No Awareness, Dental Status  Current problems with teeth and/or dentures?: No  Does patient usually wear dentures?: No    Assets:  Communication Skills Desire for Improvement Financial  Resources/Insurance Housing Intimacy Leisure Time Social Support TEFL teacher (Choose Three): Established Problem, Stable/Improving (1), Review of Psycho-Social Stressors (1), Established Problem, Worsening (2), Review of Medication Regimen & Side Effects (2) and Review of New Medication or Change in Dosage (2)  Assessment: AXIS I MDD- recurrent, severe without psychotic features. R/o Bipolar II. GAD. 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   AXIS II Deferred  AXIS III Past Medical History  Diagnosis Date  . Irritable bowel syndrome   . Depressive disorder, not elsewhere classified   . Anxiety state, unspecified   .  Esophageal reflux   . Ulcerative (chronic) proctitis   . MS (multiple sclerosis)   . Barrett esophagus   . Allergy   . Anemia   . Hiatal hernia   . Iron deficiency anemia, unspecified   . Kidney infection   . Lupus   . RA (rheumatoid arthritis)   . Tachycardia   . Fibromyalgia   . Hx of cardiovascular stress test     ETT-Myoview (11/2013): Apical inferior reversibility (diaph atten vs very slight ischemia), normal wall motion, EF 57%; low risk     AXIS IV other psychosocial or environmental problems  AXIS V 51-60 moderate symptoms   Treatment Plan/Recommendations:  Plan of Care: Medication management with supportive therapy. Risks/benefits and SE of the medication discussed. Pt verbalized understanding and verbal consent obtained for treatment. Affirm with the patient that the medications are taken as ordered. Patient expressed understanding of how their medications were to be used.  - anxiety is worse, depression is better    Laboratory: labs and EKG will be done soon thru her cardiologist  Psychotherapy: Therapy: brief supportive therapy provided. Discussed psychosocial stressors in detail.    Medications: Continue Wellbutrin XL  po qD for depression Continue Ativan  po TID prn anxiety Abilify  po qD for as adjunct treatment for depression Increase Ambien to  po qHS prn insomnia   Routine PRN Medications: Yes  Consultations: refer to therapist..the patient will schedule an appt soon  Safety Concerns: Pt denies SI and is at an acute low risk for suicide.Patient told to call clinic if any problems occur. Patient advised to go to ER if they should develop SI/HI, side effects, or if symptoms worsen. Has crisis numbers to call if needed. Pt verbalized understanding.   Other: F/up in 6 months or sooner if needed    Oletta Darter, MD 09/11/2014

## 2015-02-19 ENCOUNTER — Telehealth (HOSPITAL_COMMUNITY): Payer: Self-pay

## 2015-02-19 NOTE — Telephone Encounter (Signed)
Medication refill request - Telephone call with pt. to follow up on message left she was running out of Lorazepam and would be out before eval 03/14/15.  Pt. stated she had more panic attacks over past few months and admitted taking more than prescribed.  Discussed with patient her Ativan was a controlled medication and if she was going to be taking more than prescribed she should call to discuss with Dr. Michae Kava earlier.  Patient rescheduled for 02/21/15 as will be out on 02/22/15 by report and agreed she would need to discuss this refill with her more at evaluation.

## 2015-02-20 NOTE — Telephone Encounter (Signed)
Met with Dr. Doyne Keel to make aware of patient's status with admitting to taking Ativan more than prescribed daily and is rescheduled for an earlier appointment now on 02/21/15 to follow up.

## 2015-02-21 ENCOUNTER — Encounter (HOSPITAL_COMMUNITY): Payer: Self-pay | Admitting: Psychiatry

## 2015-02-21 ENCOUNTER — Ambulatory Visit (INDEPENDENT_AMBULATORY_CARE_PROVIDER_SITE_OTHER): Payer: Medicaid Other | Admitting: Psychiatry

## 2015-02-21 VITALS — BP 116/81 | HR 80 | Ht 66.0 in | Wt 130.6 lb

## 2015-02-21 DIAGNOSIS — F332 Major depressive disorder, recurrent severe without psychotic features: Secondary | ICD-10-CM | POA: Diagnosis not present

## 2015-02-21 DIAGNOSIS — F411 Generalized anxiety disorder: Secondary | ICD-10-CM | POA: Diagnosis not present

## 2015-02-21 DIAGNOSIS — F410 Panic disorder [episodic paroxysmal anxiety] without agoraphobia: Secondary | ICD-10-CM

## 2015-02-21 MED ORDER — BUPROPION HCL ER (XL) 150 MG PO TB24: 300.0000 mg | ORAL_TABLET | Freq: Every day | ORAL | Status: DC

## 2015-02-21 MED ORDER — QUETIAPINE FUMARATE 100 MG PO TABS: 100.0000 mg | ORAL_TABLET | Freq: Every day | ORAL | Status: DC

## 2015-02-21 MED ORDER — LORAZEPAM 1 MG PO TABS: 1.0000 mg | ORAL_TABLET | Freq: Three times a day (TID) | ORAL | Status: DC | PRN

## 2015-02-21 NOTE — Progress Notes (Signed)
Patient ID: Brandi Santiago, female   DOB: 25-Oct-1970, 44 y.o.   MRN: [id number]  [facility] [id number] Progress Note  Brandi Santiago [id number] 44 y.o.  02/21/2015 11:20 AM  Chief Complaint: "depression is better but anxiety is worse"  History of Present Illness: Anxiety is high. Reports racing thoughts, headaches, body aches, fatigue and palpiations due to anxiety.  She replays things in her head from  46yrs ago and can't seem to let go of things. It leads to panic attacks. She is having panic attacks about twice a week. Pt takes Ativan TID. It helps to calm her down for several hours.   Reports no change in depression symptoms from 6 months ago. 2 days a week she feels depressed, restless and anxious (racing thoughts, unable to shut off her mind). Reports symptoms of anhedonia, low motivation, isolation, crying spells are less intense. Denies worthlessness and helplessness.   Pt is gettitng about 6-7hrs of sleep per night with Ambien.  Energy is low. Appetite is ok and concentration is so/so.  Denies manic and hypomanic symptoms including periods of decreased need for sleep, increased energy, mood lability, impulsivity, FOI, and excessive spending.  Taking Wellbutrin, Ativan and Abilify as prescribed and denies SE.   Suicidal Ideation: No Plan Formed: No Patient has means to carry out plan: No  Homicidal Ideation: No Plan Formed: No Patient has means to carry out plan: No  Review of Systems: Psychiatric: Agitation: Yes Hallucination: No Depressed Mood: Yes Insomnia: No Hypersomnia: No Altered Concentration: Yes Feels Worthless: No Grandiose Ideas: No Belief In Special Powers: No New/Increased Substance Abuse: No Compulsions: No    Review of Systems  Constitutional: Negative for fever, chills and weight loss.  HENT: Negative for congestion, ear pain, nosebleeds and sore throat.   Eyes: Negative for blurred vision, double vision, pain and redness.  Respiratory:  Negative for cough, sputum production and shortness of breath.   Cardiovascular: Positive for palpitations. Negative for chest pain and leg swelling.  Gastrointestinal: Positive for nausea. Negative for heartburn, vomiting, abdominal pain, diarrhea and constipation.  Musculoskeletal: Negative for back pain, joint pain and neck pain.  Skin: Negative for itching and rash.  Neurological: Negative for dizziness, tremors, sensory change, seizures, loss of consciousness and headaches.  Psychiatric/Behavioral: Positive for depression. Negative for suicidal ideas, hallucinations and substance abuse. The patient is nervous/anxious. The patient does not have insomnia.      Past Medical Family, Social History: lives in [place] with fiance. Raised by parents, no siblings. Had a great childhood. Pt is divorced x2. She has 2 kids- 1 boy and 1 girl. She is unemployed and last worked Dec 2006.  reports that she has never smoked. She has never used smokeless tobacco. She reports that she does not drink alcohol or use illicit drugs.  Family History  Problem Relation Age of Onset  . Diabetes Paternal Grandmother   . Diabetes Paternal Grandfather   . Suicidality Neg Hx   . Anxiety disorder Neg Hx   . Bipolar disorder Neg Hx   . ADD / ADHD Son   . Depression Mother     Past Medical History  Diagnosis Date  . Irritable bowel syndrome   . Esophageal reflux   . Ulcerative (chronic) proctitis (HCC)   . MS (multiple sclerosis) (HCC)   . Barrett esophagus   . Allergy   . Anemia   . Hiatal hernia   . Iron deficiency anemia, unspecified   . Kidney infection   .  Lupus (HCC)   . RA (rheumatoid arthritis) (HCC)   . Tachycardia   . Fibromyalgia   . Hx of cardiovascular stress test     ETT-Myoview (11/2013):  Apical inferior reversibility (diaph atten vs very slight ischemia), normal wall motion, EF 57%; low risk  . Anxiety   . Bipolar disorder (HCC)   . Depression      Outpatient Encounter  Prescriptions as of 02/21/2015  Medication Sig  . ARIPiprazole (ABILIFY) 15 MG tablet Take 1 tablet (15 mg total) by mouth daily.  Marland Kitchen buPROPion (WELLBUTRIN XL) 150 MG 24 hr tablet Take 2 tablets (300 mg total) by mouth daily.  . cetirizine (ZYRTEC) 10 MG tablet Take 10 mg by mouth daily.  . fluticasone (FLONASE) 50 MCG/ACT nasal spray Place 2 sprays into both nostrils daily.  . hydrocortisone valerate ointment (WESTCORT) 0.2 % Apply topically 2 (two) times daily.  Marland Kitchen LORazepam (ATIVAN) 1 MG tablet Take 1 tablet (1 mg total) by mouth 3 (three) times daily as needed for anxiety.  . metoprolol succinate (TOPROL-XL) 25 MG 24 hr tablet Take 3 tablets (75 mg total) by mouth daily.  Marland Kitchen omeprazole (PRILOSEC) 40 MG capsule Take 40 mg by mouth daily.  . ondansetron (ZOFRAN) 4 MG tablet Take 2 tablets (8 mg total) by mouth every 8 (eight) hours as needed for nausea.  Marland Kitchen topiramate (TOPAMAX) 25 MG tablet Take 25 mg by mouth 3 (three) times daily.  Marland Kitchen zolpidem (AMBIEN) 10 MG tablet Take 1 tablet (10 mg total) by mouth at bedtime as needed for sleep.  Marland Kitchen oxyCODONE-acetaminophen (PERCOCET) 7.5-325 MG per tablet Take 1 tablet by mouth every 4 (four) hours as needed for pain. (Patient not taking: Reported on 02/21/2015)  . [DISCONTINUED] esomeprazole (NEXIUM) 40 MG capsule Take 40 mg by mouth daily before breakfast.   No facility-administered encounter medications on file as of 02/21/2015.    Past Psychiatric History/Hospitalization(s): Anxiety: Yes Bipolar Disorder: Yes Depression: Yes Mania: No Psychosis: No Schizophrenia: No Personality Disorder: No Hospitalization for psychiatric illness: Yes History of Electroconvulsive Shock Therapy: No Prior Suicide Attempts: No  Physical Exam: Constitutional:  BP 116/81 mmHg  Pulse 80  Ht  (1.676 m)  Wt 130 lb 9.6 oz (59.24 kg)  BMI 21.09 kg/m2  General Appearance: alert, oriented, no acute distress  Musculoskeletal: Strength & Muscle Tone: within  normal limits Gait & Station: normal Patient leans: N/A  Mental Status Examination/Evaluation: Objective: Attitude: Calm and cooperative  Appearance: Fairly Groomed, appears to be stated age  Patent attorney::  Fair  Speech:  Clear and Coherent and Normal Rate  Volume:  Normal  Mood:  Depressed and anxious  Affect:  Congruent  Thought Process:  Linear and Logical  Orientation:  Full (Time, Place, and Person)  Thought Content:  Negative  Suicidal Thoughts:  No  Homicidal Thoughts:  No  Judgement:  Intact  Insight:  Fair  Concentration: good  Memory: Immediate-fair Recent-fair Remote-fair  Recall: fair  Language: fair  Gait and Station: normal  [employer] of Knowledge: average  Psychomotor Activity:  Normal  Akathisia:  No  Handed:  Right  AIMS (if indicated):  Facial and Oral Movements  Muscles of Facial Expression: None, normal  Lips and Perioral Area: None, normal  Jaw: None, normal  Tongue: None, normal Extremity Movements: Upper (arms, wrists, hands, fingers): None, normal  Lower (legs, knees, ankles, toes): None, normal,  Trunk Movements:  Neck, shoulders, hips: None, normal,  Overall Severity : Severity of abnormal movements (  highest score from questions above): None, normal  Incapacitation due to abnormal movements: None, normal  Patient's awareness of abnormal movements (rate only patient's report): No Awareness, Dental Status  Current problems with teeth and/or dentures?: No  Does patient usually wear dentures?: No    Assets:  Communication Skills Desire for Improvement Financial Resources/Insurance Housing Intimacy Leisure Time Social Support TEFL teacher (Choose Three): Established Problem, Stable/Improving (1), Review of Psycho-Social Stressors (1), Established Problem, Worsening (2), Review of Medication Regimen & Side Effects (2) and Review of New Medication or Change in Dosage  (2)  Assessment: AXIS I MDD- recurrent, severe without psychotic features. R/o Bipolar II. GAD. 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   AXIS II Deferred  AXIS III Past Medical History  Diagnosis Date  . Irritable bowel syndrome   . Depressive disorder, not elsewhere classified   . Anxiety state, unspecified   . Esophageal reflux   . Ulcerative (chronic) proctitis   . MS (multiple sclerosis)   . Barrett esophagus   . Allergy   . Anemia   . Hiatal hernia   . Iron deficiency anemia, unspecified   . Kidney infection   . Lupus   . RA (rheumatoid arthritis)   . Tachycardia   . Fibromyalgia   . Hx of cardiovascular stress test     ETT-Myoview (11/2013): Apical inferior reversibility (diaph atten vs very slight ischemia), normal wall motion, EF 57%; low risk     AXIS IV other psychosocial or environmental problems  AXIS V 51-60 moderate symptoms   Treatment Plan/Recommendations:  Plan of Care: Medication management with supportive therapy. Risks/benefits and SE of the medication discussed. Pt verbalized understanding and verbal consent obtained for treatment. Affirm with the patient that the medications are taken as ordered. Patient expressed understanding of how their medications were to be used.  - anxiety is worse, depression is better    Laboratory: labs and EKG will be done soon thru her cardiologist  Psychotherapy: Therapy: brief supportive therapy provided. Discussed psychosocial stressors in detail.    Medications: Continue Wellbutrin XL  po qD for depression Continue Ativan  po TID prn anxiety D/c Abilify Start trial of Seroquel  po qHS for as adjunct treatment for depression,anxiety and sleep D/c Ambien    Routine PRN Medications: Yes  Consultations: refer to therapist..the patient will schedule an appt soon  Safety Concerns: Pt denies SI and is at an acute low risk for  suicide.Patient told to call clinic if any problems occur. Patient advised to go to ER if they should develop SI/HI, side effects, or if symptoms worsen. Has crisis numbers to call if needed. Pt verbalized understanding.   Other: F/up in 2 months or sooner if needed    Oletta Darter, MD 02/21/2015

## 2015-02-27 ENCOUNTER — Telehealth (HOSPITAL_COMMUNITY): Payer: Self-pay | Admitting: *Deleted

## 2015-02-27 NOTE — Telephone Encounter (Signed)
Prior authorization for Quetiapine received. Called [phone] spoke with [facility] who gave approval [id number]. Called to notify pharmacy.

## 2015-03-14 ENCOUNTER — Ambulatory Visit (HOSPITAL_COMMUNITY): Payer: Self-pay | Admitting: Psychiatry

## 2015-04-23 ENCOUNTER — Other Ambulatory Visit (HOSPITAL_COMMUNITY): Payer: Self-pay | Admitting: Psychiatry

## 2015-04-23 ENCOUNTER — Ambulatory Visit (HOSPITAL_COMMUNITY): Payer: Medicaid Other | Admitting: Psychiatry

## 2015-04-23 DIAGNOSIS — F410 Panic disorder [episodic paroxysmal anxiety] without agoraphobia: Secondary | ICD-10-CM

## 2015-04-23 DIAGNOSIS — F332 Major depressive disorder, recurrent severe without psychotic features: Secondary | ICD-10-CM

## 2015-04-23 DIAGNOSIS — F411 Generalized anxiety disorder: Secondary | ICD-10-CM

## 2015-04-23 MED ORDER — BUPROPION HCL ER (XL) 150 MG PO TB24: 300.0000 mg | ORAL_TABLET | Freq: Every day | ORAL | Status: DC

## 2015-04-23 MED ORDER — QUETIAPINE FUMARATE 100 MG PO TABS: 100.0000 mg | ORAL_TABLET | Freq: Every day | ORAL | Status: DC

## 2015-04-23 MED ORDER — LORAZEPAM 1 MG PO TABS: 1.0000 mg | ORAL_TABLET | Freq: Three times a day (TID) | ORAL | Status: DC | PRN

## 2015-04-30 ENCOUNTER — Telehealth (HOSPITAL_COMMUNITY): Payer: Self-pay

## 2015-04-30 NOTE — Telephone Encounter (Signed)
Medication management - Telephone message left for pt. to call back regarding any medication questions she may have after she left one requesting a call back.  Informed pt. on call if she calls again to please leave her concern on message.   Informed patient on message Dr. Michae Kava was in the office this date but would not be here the remainder of the week.

## 2015-05-01 NOTE — Telephone Encounter (Signed)
Medication management - Telephone call with patient stating she filled her Lorazepam on 04/25/15 but only got 30 pills and takes three times a day.  Informed pt. the prescription was written correctly and was for #90 with 1 refill.  Agreed to call [facility].  Called and spoke with Selena Batten, pharmacist at [facility] on [facility] who verified they made a mistake and still owe patient #60 more pills as they only filled #30 and but they reviewed original prescription and should have been for #90.   Called patient back to inform [facility] admitted they had made a mistake and only given her #30 pills for a months supply but for patient to return and they would give her #60 more pills.  Patient to call back if any further problems getting orders correct.

## 2015-05-28 ENCOUNTER — Encounter (HOSPITAL_COMMUNITY): Payer: Self-pay | Admitting: Psychiatry

## 2015-05-28 ENCOUNTER — Ambulatory Visit (INDEPENDENT_AMBULATORY_CARE_PROVIDER_SITE_OTHER): Payer: Medicaid Other | Admitting: Psychiatry

## 2015-05-28 VITALS — BP 110/80 | HR 63 | Resp 12 | Ht 66.0 in | Wt 127.8 lb

## 2015-05-28 DIAGNOSIS — F410 Panic disorder [episodic paroxysmal anxiety] without agoraphobia: Secondary | ICD-10-CM

## 2015-05-28 DIAGNOSIS — F411 Generalized anxiety disorder: Secondary | ICD-10-CM

## 2015-05-28 DIAGNOSIS — F332 Major depressive disorder, recurrent severe without psychotic features: Secondary | ICD-10-CM | POA: Diagnosis not present

## 2015-05-28 MED ORDER — QUETIAPINE FUMARATE 200 MG PO TABS: 200.0000 mg | ORAL_TABLET | Freq: Every day | ORAL | Status: DC

## 2015-05-28 MED ORDER — BUPROPION HCL ER (XL) 150 MG PO TB24: 300.0000 mg | ORAL_TABLET | Freq: Every day | ORAL | Status: DC

## 2015-05-28 MED ORDER — LORAZEPAM 1 MG PO TABS: 1.0000 mg | ORAL_TABLET | Freq: Three times a day (TID) | ORAL | Status: DC | PRN

## 2015-05-28 NOTE — Progress Notes (Signed)
[facility] [id number] Progress Note  Brandi Santiago [id number] 45 y.o.  05/28/2015 2:04 PM  Chief Complaint: "nothing has changed since last visit"  History of Present Illness: Anxiety is high. Reports racing thoughts, headaches, body aches, fatigue and palpiations due to anxiety.  She replays things in her head from  29yrs ago and can't seem to let go of things. It leads to panic attacks. She is having panic attacks about twice a week. Pt takes Ativan TID. It helps to calm her down for several hours. States she has a lot of stress for the last 6 weeks and that is why anxiety remains high.   Reports no change in depression symptoms. 2 days a week she feels depressed, restless and anxious (racing thoughts, unable to shut off her mind). Reports symptoms of anhedonia, low motivation, isolation, crying spells are less intense. Denies worthlessness and helplessness.   Pt is gettitng about 3-6hrs of sleep per night with Seroquel 100mg . Pt took Seroquel 200mg  once and slept for 10-11 hrs and wasn't tired the next day.  Energy is low and she is not napping during the day. Appetite is ok and concentration is so/so.  Denies manic and hypomanic symptoms including periods of decreased need for sleep, increased energy, mood lability, impulsivity, FOI, and excessive spending.  Taking Wellbutrin, Ativan and Seroquel as prescribed and denies SE.   Suicidal Ideation: No Plan Formed: No Patient has means to carry out plan: No  Homicidal Ideation: No Plan Formed: No Patient has means to carry out plan: No  Review of Systems: Psychiatric: Agitation: Yes Hallucination: No Depressed Mood: Yes Insomnia: Yes Hypersomnia: No Altered Concentration: Yes Feels Worthless: No Grandiose Ideas: No Belief In Special Powers: No New/Increased Substance Abuse: No Compulsions: No    Review of Systems  Constitutional: Negative for fever, chills and weight loss.  HENT: Negative for congestion, ear pain,  nosebleeds and sore throat.   Eyes: Negative for blurred vision, double vision, pain and redness.  Respiratory: Negative for cough, sputum production and shortness of breath.   Cardiovascular: Positive for palpitations. Negative for chest pain and leg swelling.  Gastrointestinal: Positive for nausea. Negative for heartburn, vomiting, abdominal pain, diarrhea and constipation.  Musculoskeletal: Negative for back pain, joint pain and neck pain.  Skin: Negative for itching and rash.  Neurological: Negative for dizziness, tremors, sensory change, seizures, loss of consciousness and headaches.  Psychiatric/Behavioral: Positive for depression. Negative for suicidal ideas, hallucinations and substance abuse. The patient is nervous/anxious. The patient does not have insomnia.      Past Medical Family, Social History: lives in [place] with fiance. Raised by parents, no siblings. Had a great childhood. Pt is divorced x2. She has 2 kids- 1 boy and 1 girl. She is unemployed and last worked Dec 2006.  reports that she has never smoked. She has never used smokeless tobacco. She reports that she does not drink alcohol or use illicit drugs.  Family History  Problem Relation Age of Onset  . Diabetes Paternal Grandmother   . Diabetes Paternal Grandfather   . Suicidality Neg Hx   . Anxiety disorder Neg Hx   . Bipolar disorder Neg Hx   . ADD / ADHD Son   . Depression Mother     Past Medical History  Diagnosis Date  . Irritable bowel syndrome   . Esophageal reflux   . Ulcerative (chronic) proctitis (HCC)   . MS (multiple sclerosis) (HCC)   . Barrett esophagus   . Allergy   .  Anemia   . Hiatal hernia   . Iron deficiency anemia, unspecified   . Kidney infection   . Lupus (HCC)   . RA (rheumatoid arthritis) (HCC)   . Tachycardia   . Fibromyalgia   . Hx of cardiovascular stress test     ETT-Myoview (11/2013):  Apical inferior reversibility (diaph atten vs very slight ischemia), normal wall motion,  EF 57%; low risk  . Anxiety   . Bipolar disorder (HCC)   . Depression      Outpatient Encounter Prescriptions as of 05/28/2015  Medication Sig  . buPROPion (WELLBUTRIN XL) 150 MG 24 hr tablet Take 2 tablets (300 mg total) by mouth daily.  . cetirizine (ZYRTEC) 10 MG tablet Take 10 mg by mouth daily.  . fluticasone (FLONASE) 50 MCG/ACT nasal spray Place 2 sprays into both nostrils daily.  . hydrocortisone valerate ointment (WESTCORT) 0.2 % Apply topically 2 (two) times daily.  Marland Kitchen LORazepam (ATIVAN) 1 MG tablet Take 1 tablet (1 mg total) by mouth 3 (three) times daily as needed for anxiety.  . metoprolol succinate (TOPROL-XL) 25 MG 24 hr tablet Take 3 tablets (75 mg total) by mouth daily.  Marland Kitchen omeprazole (PRILOSEC) 40 MG capsule Take 40 mg by mouth daily.  . ondansetron (ZOFRAN) 4 MG tablet Take 2 tablets (8 mg total) by mouth every 8 (eight) hours as needed for nausea.  Marland Kitchen QUEtiapine (SEROQUEL) 100 MG tablet Take 1 tablet (100 mg total) by mouth at bedtime.  . topiramate (TOPAMAX) 25 MG tablet Take 25 mg by mouth 3 (three) times daily.  . [DISCONTINUED] oxyCODONE-acetaminophen (PERCOCET) 7.5-325 MG per tablet Take 1 tablet by mouth every 4 (four) hours as needed for pain. (Patient not taking: Reported on 02/21/2015)   No facility-administered encounter medications on file as of 05/28/2015.    Past Psychiatric History/Hospitalization(s): Anxiety: Yes Bipolar Disorder: Yes Depression: Yes Mania: No Psychosis: No Schizophrenia: No Personality Disorder: No Hospitalization for psychiatric illness: Yes History of Electroconvulsive Shock Therapy: No Prior Suicide Attempts: No  Physical Exam: Constitutional:  BP 110/80 mmHg  Pulse 63  Resp 12  Ht  (1.676 m)  Wt 127 lb 12.8 oz (57.97 kg)  BMI 20.64 kg/m2  General Appearance: alert, oriented, no acute distress  Musculoskeletal: Strength & Muscle Tone: within normal limits Gait & Station: normal Patient leans: N/A  Mental  Status Examination/Evaluation: Objective: Attitude: Calm and cooperative  Appearance: Fairly Groomed, appears to be stated age  Patent attorney::  Fair  Speech:  Clear and Coherent and Normal Rate  Volume:  Normal  Mood:  Depressed and anxious  Affect:  Congruent and irritable  Thought Process:  Linear and Logical  Orientation:  Full (Time, Place, and Person)  Thought Content:  Negative  Suicidal Thoughts:  No  Homicidal Thoughts:  No  Judgement:  Intact  Insight:  Fair  Concentration: good  Memory: Immediate-fair Recent-fair Remote-fair  Recall: fair  Language: fair  Gait and Station: normal  [employer] of Knowledge: average  Psychomotor Activity:  Normal  Akathisia:  No  Handed:  Right  AIMS (if indicated):  Facial and Oral Movements  Muscles of Facial Expression: None, normal  Lips and Perioral Area: None, normal  Jaw: None, normal  Tongue: None, normal Extremity Movements: Upper (arms, wrists, hands, fingers): None, normal  Lower (legs, knees, ankles, toes): None, normal,  Trunk Movements:  Neck, shoulders, hips: None, normal,  Overall Severity : Severity of abnormal movements (highest score from questions above): None, normal  Incapacitation  due to abnormal movements: None, normal  Patient's awareness of abnormal movements (rate only patient's report): No Awareness, Dental Status  Current problems with teeth and/or dentures?: No  Does patient usually wear dentures?: No    Assets:  Communication Skills Desire for Improvement Financial Resources/Insurance Housing Intimacy Leisure Time Social Support TEFL teacher (Choose Three): Review of Psycho-Social Stressors (1), Established Problem, Worsening (2), Review of Medication Regimen & Side Effects (2) and Review of New Medication or Change in Dosage (2)  Assessment: AXIS I MDD- recurrent, severe without psychotic features. R/o Bipolar II. GAD. Panic disorder  without agorophobia.   AXIS II Deferred   Treatment Plan/Recommendations:  Plan of Care: Medication management with supportive therapy. Risks/benefits and SE of the medication discussed. Pt verbalized understanding and verbal consent obtained for treatment. Affirm with the patient that the medications are taken as ordered. Patient expressed understanding of how their medications were to be used.  - anxiety is worse, depression is better    Laboratory: labs and EKG will be done soon thru her cardiologist  Psychotherapy: Therapy: brief supportive therapy provided. Discussed psychosocial stressors in detail.    Medications: Continue Wellbutrin XL 300mg  po qD for depression Continue Ativan 1mg  po TID prn anxiety Increase Seroquel to 200mg  po qHS for as adjunct treatment for depression,anxiety and sleep    Routine PRN Medications: Yes  Consultations: pt does not have transportation so has not scheduled appt for therapy  Safety Concerns: Pt denies SI and is at an acute low risk for suicide.Patient told to call clinic if any problems occur. Patient advised to go to ER if they should develop SI/HI, side effects, or if symptoms worsen. Has crisis numbers to call if needed. Pt verbalized understanding.   Other: F/up in 2 months or sooner if needed    Oletta Darter, MD 05/28/2015

## 2015-07-25 ENCOUNTER — Ambulatory Visit (INDEPENDENT_AMBULATORY_CARE_PROVIDER_SITE_OTHER): Payer: Medicaid Other | Admitting: Psychiatry

## 2015-07-25 ENCOUNTER — Encounter (HOSPITAL_COMMUNITY): Payer: Self-pay | Admitting: Psychiatry

## 2015-07-25 VITALS — BP 128/88 | HR 102 | Ht 66.0 in | Wt 132.6 lb

## 2015-07-25 DIAGNOSIS — F411 Generalized anxiety disorder: Secondary | ICD-10-CM

## 2015-07-25 DIAGNOSIS — F332 Major depressive disorder, recurrent severe without psychotic features: Secondary | ICD-10-CM

## 2015-07-25 DIAGNOSIS — G4700 Insomnia, unspecified: Secondary | ICD-10-CM

## 2015-07-25 DIAGNOSIS — F410 Panic disorder [episodic paroxysmal anxiety] without agoraphobia: Secondary | ICD-10-CM

## 2015-07-25 MED ORDER — LORAZEPAM 1 MG PO TABS: 1.0000 mg | ORAL_TABLET | Freq: Three times a day (TID) | ORAL | Status: DC | PRN

## 2015-07-25 MED ORDER — QUETIAPINE FUMARATE 200 MG PO TABS: 200.0000 mg | ORAL_TABLET | Freq: Every day | ORAL | Status: DC

## 2015-07-25 MED ORDER — BUPROPION HCL ER (XL) 150 MG PO TB24: 300.0000 mg | ORAL_TABLET | Freq: Every day | ORAL | Status: DC

## 2015-07-25 NOTE — Progress Notes (Signed)
Patient ID: Brandi Santiago, female   DOB: 1971-04-24, 45 y.o.   MRN: [id number]  [facility] [id number] Progress Note  Brandi Santiago [id number] 45 y.o.  07/25/2015 2:16 PM  Chief Complaint: "doing okt"  History of Present Illness: Anxiety is well controlled with Ativan. . Reports racing thoughts, headaches, body aches, fatigue and palpiations due to anxiety are less intense. She had 2 bad panic attacks yesterday because she ran out of Ativan. Prior to that no panic attacks since last visit.  Pt takes Ativan TID. It helps to calm her down for several hours.   Depression is better. Pt gets down about 2-3 days a week. Crying spells have decreased in frequency. Seroquel is helping a lot. Denies hopelessness and worthlessness.   Pt is gettitng about 9-10 hrs of sleep per night with Seroquel 200mg . Energy is variable and she is not napping during the day. Appetite is ok and concentration is so/so.  Denies manic and hypomanic symptoms including periods of decreased need for sleep, increased energy, mood lability, impulsivity, FOI, and excessive spending.  Taking Wellbutrin, Ativan and Seroquel as prescribed and denies SE.   Suicidal Ideation: No Plan Formed: No Patient has means to carry out plan: No  Homicidal Ideation: No Plan Formed: No Patient has means to carry out plan: No  Review of Systems: Psychiatric: Agitation: Yes Hallucination: No Depressed Mood: Yes Insomnia: No Hypersomnia: No Altered Concentration: Yes Feels Worthless: No Grandiose Ideas: No Belief In Special Powers: No New/Increased Substance Abuse: No Compulsions: No    Review of Systems  Constitutional: Negative for fever, chills and weight loss.  HENT: Positive for congestion. Negative for ear pain, nosebleeds and sore throat.   Eyes: Negative for blurred vision, double vision, pain and redness.  Respiratory: Negative for cough, sputum production and shortness of breath.   Cardiovascular: Positive for  palpitations. Negative for chest pain and leg swelling.  Gastrointestinal: Positive for nausea. Negative for heartburn, vomiting, abdominal pain, diarrhea and constipation.  Musculoskeletal: Negative for back pain, joint pain and neck pain.  Skin: Negative for itching and rash.  Neurological: Negative for dizziness, tremors, sensory change, seizures, loss of consciousness and headaches.  Psychiatric/Behavioral: Positive for depression. Negative for suicidal ideas, hallucinations and substance abuse. The patient is nervous/anxious. The patient does not have insomnia.      Past Medical Family, Social History: lives in [place] with fiance. Raised by parents, no siblings. Had a great childhood. Pt is divorced x2. She has 2 kids- 1 boy and 1 girl. She is unemployed and last worked Dec 2006.  reports that she has never smoked. She has never used smokeless tobacco. She reports that she does not drink alcohol or use illicit drugs.  Family History  Problem Relation Age of Onset  . Diabetes Paternal Grandmother   . Diabetes Paternal Grandfather   . Suicidality Neg Hx   . Anxiety disorder Neg Hx   . Bipolar disorder Neg Hx   . ADD / ADHD Son   . Depression Mother     Past Medical History  Diagnosis Date  . Irritable bowel syndrome   . Esophageal reflux   . Ulcerative (chronic) proctitis (HCC)   . MS (multiple sclerosis) (HCC)   . Barrett esophagus   . Allergy   . Anemia   . Hiatal hernia   . Iron deficiency anemia, unspecified   . Kidney infection   . Lupus (HCC)   . RA (rheumatoid arthritis) (HCC)   . Tachycardia   .  Fibromyalgia   . Hx of cardiovascular stress test     ETT-Myoview (11/2013):  Apical inferior reversibility (diaph atten vs very slight ischemia), normal wall motion, EF 57%; low risk  . Anxiety   . Bipolar disorder (HCC)   . Depression      Outpatient Encounter Prescriptions as of 07/25/2015  Medication Sig  . buPROPion (WELLBUTRIN XL) 150 MG 24 hr tablet Take 2  tablets (300 mg total) by mouth daily.  . cetirizine (ZYRTEC) 10 MG tablet Take 10 mg by mouth daily.  . fluticasone (FLONASE) 50 MCG/ACT nasal spray Place 2 sprays into both nostrils daily.  . hydrocortisone valerate ointment (WESTCORT) 0.2 % Apply topically 2 (two) times daily.  Marland Kitchen LORazepam (ATIVAN) 1 MG tablet Take 1 tablet (1 mg total) by mouth 3 (three) times daily as needed for anxiety.  . metoprolol succinate (TOPROL-XL) 25 MG 24 hr tablet Take 3 tablets (75 mg total) by mouth daily.  Marland Kitchen omeprazole (PRILOSEC) 40 MG capsule Take 40 mg by mouth daily.  . ondansetron (ZOFRAN) 4 MG tablet Take 2 tablets (8 mg total) by mouth every 8 (eight) hours as needed for nausea.  Marland Kitchen QUEtiapine (SEROQUEL) 200 MG tablet Take 1 tablet (200 mg total) by mouth at bedtime.  . topiramate (TOPAMAX) 25 MG tablet Take 25 mg by mouth 3 (three) times daily.   No facility-administered encounter medications on file as of 07/25/2015.    Past Psychiatric History/Hospitalization(s): Anxiety: Yes Bipolar Disorder: Yes Depression: Yes Mania: No Psychosis: No Schizophrenia: No Personality Disorder: No Hospitalization for psychiatric illness: Yes History of Electroconvulsive Shock Therapy: No Prior Suicide Attempts: No  Physical Exam: Constitutional:  BP 128/88 mmHg  Pulse 102  Ht 5\' 6"  (1.676 m)  Wt 132 lb 9.6 oz (60.147 kg)  BMI 21.41 kg/m2  General Appearance: alert, oriented, no acute distress  Musculoskeletal: Strength & Muscle Tone: within normal limits Gait & Station: normal Patient leans: straight  Mental Status Examination/Evaluation: Objective: Attitude: Calm and cooperative  Appearance: Fairly Groomed, appears to be stated age  Patent attorney::  Fair  Speech:  Clear and Coherent and Normal Rate  Volume:  Normal  Mood:  Depressed and anxious  Affect:  Congruent -brighter and calmer than at previous appts  Thought Process:  Linear and Logical  Orientation:  Full (Time, Place, and Person)   Thought Content:  Negative  Suicidal Thoughts:  No  Homicidal Thoughts:  No  Judgement:  Intact  Insight:  Fair  Concentration: good  Memory: Immediate-fair Recent-fair Remote-fair  Recall: fair  Language: fair  Gait and Station: normal  [employer] of Knowledge: average  Psychomotor Activity:  Normal  Akathisia:  No  Handed:  Right  AIMS (if indicated):  Facial and Oral Movements  Muscles of Facial Expression: None, normal  Lips and Perioral Area: None, normal  Jaw: None, normal  Tongue: None, normal Extremity Movements: Upper (arms, wrists, hands, fingers): None, normal  Lower (legs, knees, ankles, toes): None, normal,  Trunk Movements:  Neck, shoulders, hips: None, normal,  Overall Severity : Severity of abnormal movements (highest score from questions above): None, normal  Incapacitation due to abnormal movements: None, normal  Patient's awareness of abnormal movements (rate only patient's report): No Awareness, Dental Status  Current problems with teeth and/or dentures?: No  Does patient usually wear dentures?: No    Assets:  Communication Skills Desire for Improvement Financial Resources/Insurance Housing Intimacy Leisure Time Social Support Advertising account planner Decision  Making (Choose Three): Established Problem, Stable/Improving (1), Review of Psycho-Social Stressors (1) and Review of Medication Regimen & Side Effects (2)  Assessment: AXIS I MDD- recurrent, severe without psychotic features. R/o Bipolar II. GAD. 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   AXIS II Deferred   Treatment Plan/Recommendations:  Plan of Care: Medication management with supportive therapy. Risks/benefits and SE of the medication discussed. Pt verbalized understanding and verbal consent obtained for treatment. Affirm with the patient that the medications are taken as ordered. Patient expressed understanding of how their medications were to be used.   - anxiety is worse, depression is better    Laboratory: labs and EKG will be done soon thru her cardiologist  Psychotherapy: Therapy: brief supportive therapy provided. Discussed psychosocial stressors in detail.    Medications: Continue Wellbutrin XL  po qD for depression Continue Ativan  po TID prn anxiety Seroquel to  po qHS for as adjunct treatment for depression,anxiety and sleep    Routine PRN Medications: Yes  Consultations: pt does not have transportation so has not scheduled appt for therapy  Safety Concerns: Pt denies SI and is at an acute low risk for suicide.Patient told to call clinic if any problems occur. Patient advised to go to ER if they should develop SI/HI, side effects, or if symptoms worsen. Has crisis numbers to call if needed. Pt verbalized understanding.   Other: F/up in 3 months or sooner if needed    Oletta Darter, MD 07/25/2015

## 2015-08-02 NOTE — Progress Notes (Signed)
HPI: FU palpitations. Echocardiogram in 09/2012 demonstrated normal LV function and grade 1 diastolic dysfunction. Holter monitor 09/2012 demonstrated NSR and sinus tachycardia. CTA in June of 2015 showed no pulmonary embolus. Nuclear study repeated in July 2015. Ejection fraction 57%. There was felt to be diaphragmatic attenuation but slight ischemia could not be excluded. Since last seen,   Current Outpatient Prescriptions  Medication Sig Dispense Refill  . buPROPion (WELLBUTRIN XL) 150 MG 24 hr tablet Take 2 tablets (300 mg total) by mouth daily. 60 tablet 2  . cetirizine (ZYRTEC) 10 MG tablet Take 10 mg by mouth daily.    . fluticasone (FLONASE) 50 MCG/ACT nasal spray Place 2 sprays into both nostrils daily.    . hydrocortisone valerate ointment (WESTCORT) 0.2 % Apply topically 2 (two) times daily. 45 g 0  . LORazepam (ATIVAN) 1 MG tablet Take 1 tablet (1 mg total) by mouth 3 (three) times daily as needed for anxiety. 90 tablet 2  . metoprolol succinate (TOPROL-XL) 25 MG 24 hr tablet Take 3 tablets (75 mg total) by mouth daily. 90 tablet 12  . omeprazole (PRILOSEC) 40 MG capsule Take 40 mg by mouth daily.    . ondansetron (ZOFRAN) 4 MG tablet Take 2 tablets (8 mg total) by mouth every 8 (eight) hours as needed for nausea. 30 tablet 0  . QUEtiapine (SEROQUEL) 200 MG tablet Take 1 tablet (200 mg total) by mouth at bedtime. 30 tablet 2  . topiramate (TOPAMAX) 25 MG tablet Take 25 mg by mouth 3 (three) times daily.     No current facility-administered medications for this visit.     Past Medical History  Diagnosis Date  . Irritable bowel syndrome   . Esophageal reflux   . Ulcerative (chronic) proctitis (HCC)   . MS (multiple sclerosis) (HCC)   . Barrett esophagus   . Allergy   . Anemia   . Hiatal hernia   . Iron deficiency anemia, unspecified   . Kidney infection   . Lupus (HCC)   . RA (rheumatoid arthritis) (HCC)   . Tachycardia   . Fibromyalgia   . Hx of cardiovascular  stress test     ETT-Myoview (11/2013):  Apical inferior reversibility (diaph atten vs very slight ischemia), normal wall motion, EF 57%; low risk  . Anxiety   . Bipolar disorder (HCC)   . Depression     Past Surgical History  Procedure Laterality Date  . Nasal sinus surgery    . Mandible fracture surgery    . Appendectomy    . Cesarean section    . Partial hysterectomy      Social History   Social History  . Marital Status: Divorced    Spouse Name: N/A  . Number of Children: 2  . Years of Education: N/A   Occupational History  . Student    Social History Main Topics  . Smoking status: Never Smoker   . Smokeless tobacco: Never Used  . Alcohol Use: No     Comment: occasional- afew times a year (2-3 glasses of wine)  . Drug Use: No  . Sexual Activity: Yes    Birth Control/ Protection: Surgical   Other Topics Concern  . Not on file   Social History Narrative    Family History  Problem Relation Age of Onset  . Diabetes Paternal Grandmother   . Diabetes Paternal Grandfather   . Suicidality Neg Hx   . Anxiety disorder Neg Hx   . Bipolar disorder  Neg Hx   . ADD / ADHD Son   . Depression Mother     ROS: no fevers or chills, productive cough, hemoptysis, dysphasia, odynophagia, melena, hematochezia, dysuria, hematuria, rash, seizure activity, orthopnea, PND, pedal edema, claudication. Remaining systems are negative.  Physical Exam: Well-developed well-nourished in no acute distress.  Skin is warm and dry.  HEENT is normal.  Neck is supple.  Chest is clear to auscultation with normal expansion.  Cardiovascular exam is regular rate and rhythm.  Abdominal exam nontender or distended. No masses palpated. Extremities show no edema. neuro grossly intact  ECG     This encounter was created in error - please disregard.

## 2015-08-06 ENCOUNTER — Encounter: Payer: Self-pay | Admitting: Cardiology

## 2015-08-07 ENCOUNTER — Encounter: Payer: Self-pay | Admitting: *Deleted

## 2015-10-17 ENCOUNTER — Encounter (HOSPITAL_COMMUNITY): Payer: Self-pay | Admitting: Psychiatry

## 2015-10-17 ENCOUNTER — Ambulatory Visit (INDEPENDENT_AMBULATORY_CARE_PROVIDER_SITE_OTHER): Payer: Medicaid Other | Admitting: Psychiatry

## 2015-10-17 VITALS — BP 116/68 | HR 86 | Ht 66.0 in | Wt 128.4 lb

## 2015-10-17 DIAGNOSIS — F332 Major depressive disorder, recurrent severe without psychotic features: Secondary | ICD-10-CM | POA: Diagnosis not present

## 2015-10-17 DIAGNOSIS — F410 Panic disorder [episodic paroxysmal anxiety] without agoraphobia: Secondary | ICD-10-CM | POA: Diagnosis not present

## 2015-10-17 DIAGNOSIS — F411 Generalized anxiety disorder: Secondary | ICD-10-CM

## 2015-10-17 DIAGNOSIS — G4700 Insomnia, unspecified: Secondary | ICD-10-CM | POA: Diagnosis not present

## 2015-10-17 MED ORDER — LORAZEPAM 1 MG PO TABS: 1.0000 mg | ORAL_TABLET | Freq: Three times a day (TID) | ORAL | Status: DC | PRN

## 2015-10-17 MED ORDER — QUETIAPINE FUMARATE 25 MG PO TABS: 25.0000 mg | ORAL_TABLET | Freq: Every day | ORAL | Status: DC | PRN

## 2015-10-17 MED ORDER — QUETIAPINE FUMARATE 200 MG PO TABS: 200.0000 mg | ORAL_TABLET | Freq: Every day | ORAL | Status: DC

## 2015-10-17 MED ORDER — BUPROPION HCL ER (XL) 150 MG PO TB24: 300.0000 mg | ORAL_TABLET | Freq: Every day | ORAL | Status: DC

## 2015-10-17 NOTE — Progress Notes (Signed)
Patient ID: Brandi Santiago, female   DOB: 1971/02/04, 45 y.o.   MRN: [id number] Patient ID: Brandi Santiago, female   DOB: March 03, 1971, 45 y.o.   MRN: [id number]  [facility] [id number] Progress Note  Brandi Santiago [id number] 45 y.o.  10/17/2015 4:23 PM  Chief Complaint: "alot going on"  History of Present Illness: Anxiety is very high. She is reporting multiple stressors with health, kids and mother. Pt is taking Ativan TID and it calms her within 15 min.  Reports racing thoughts, headaches, body aches, fatigue and palpiations due to anxiety.  She is having more panic attacks since last visit.    Depression is worse. Pt gets down about 3-5 days a week for the last month. Crying spells have increased in frequency. Seroquel is helping a lot. Denies hopelessness and worthlessness.   Pt is gettitng about 9-10 hrs of sleep per night with Seroquel 200mg . Energy is variable and she is not napping during the day. Appetite is ok and concentration is so/so.  Denies manic and hypomanic symptoms including periods of decreased need for sleep, increased energy, mood lability, impulsivity, FOI, and excessive spending.  Taking Wellbutrin, Ativan and Seroquel as prescribed and denies SE.   Suicidal Ideation: No Plan Formed: No Patient has means to carry out plan: No  Homicidal Ideation: No Plan Formed: No Patient has means to carry out plan: No  Review of Systems: Psychiatric: Agitation: Yes Hallucination: No Depressed Mood: Yes Insomnia: No Hypersomnia: No Altered Concentration: Yes Feels Worthless: No Grandiose Ideas: No Belief In Special Powers: No New/Increased Substance Abuse: No Compulsions: No    Review of Systems  Constitutional: Negative for fever, chills and weight loss.  HENT: Negative for congestion, ear pain, nosebleeds and sore throat.   Eyes: Negative for blurred vision, double vision, pain and redness.  Respiratory: Negative for cough, sputum production and shortness  of breath.   Cardiovascular: Positive for palpitations. Negative for chest pain and leg swelling.  Gastrointestinal: Positive for nausea. Negative for heartburn, vomiting, abdominal pain, diarrhea and constipation.  Musculoskeletal: Negative for back pain, joint pain and neck pain.  Skin: Positive for rash. Negative for itching.  Neurological: Negative for dizziness, tremors, sensory change, seizures, loss of consciousness and headaches.  Psychiatric/Behavioral: Positive for depression. Negative for suicidal ideas, hallucinations and substance abuse. The patient is nervous/anxious. The patient does not have insomnia.      Past Medical Family, Social History: lives in [place] with fiance. Raised by parents, no siblings. Had a great childhood. Pt is divorced x2. She has 2 kids- 1 boy and 1 girl. She is unemployed and last worked Dec 2006.  reports that she has never smoked. She has never used smokeless tobacco. She reports that she does not drink alcohol or use illicit drugs.  Family History  Problem Relation Age of Onset  . Diabetes Paternal Grandmother   . Diabetes Paternal Grandfather   . Suicidality Neg Hx   . Anxiety disorder Neg Hx   . Bipolar disorder Neg Hx   . ADD / ADHD Son   . Depression Mother     Past Medical History  Diagnosis Date  . Irritable bowel syndrome   . Esophageal reflux   . Ulcerative (chronic) proctitis (HCC)   . MS (multiple sclerosis) (HCC)   . Barrett esophagus   . Allergy   . Anemia   . Hiatal hernia   . Iron deficiency anemia, unspecified   . Kidney infection   . Lupus (HCC)   .  RA (rheumatoid arthritis) (HCC)   . Tachycardia   . Fibromyalgia   . Hx of cardiovascular stress test     ETT-Myoview (11/2013):  Apical inferior reversibility (diaph atten vs very slight ischemia), normal wall motion, EF 57%; low risk  . Anxiety   . Bipolar disorder (HCC)   . Depression      Outpatient Encounter Prescriptions as of 10/17/2015  Medication Sig  .  buPROPion (WELLBUTRIN XL) 150 MG 24 hr tablet Take 2 tablets (300 mg total) by mouth daily.  . cetirizine (ZYRTEC) 10 MG tablet Take 10 mg by mouth daily.  . fluticasone (FLONASE) 50 MCG/ACT nasal spray Place 2 sprays into both nostrils daily.  . hydrocortisone valerate ointment (WESTCORT) 0.2 % Apply topically 2 (two) times daily.  Marland Kitchen LORazepam (ATIVAN) 1 MG tablet Take 1 tablet (1 mg total) by mouth 3 (three) times daily as needed for anxiety.  . metoprolol succinate (TOPROL-XL) 25 MG 24 hr tablet Take 3 tablets (75 mg total) by mouth daily.  Marland Kitchen omeprazole (PRILOSEC) 40 MG capsule Take 40 mg by mouth daily.  . ondansetron (ZOFRAN) 4 MG tablet Take 2 tablets (8 mg total) by mouth every 8 (eight) hours as needed for nausea.  Marland Kitchen QUEtiapine (SEROQUEL) 200 MG tablet Take 1 tablet (200 mg total) by mouth at bedtime.  . topiramate (TOPAMAX) 25 MG tablet Take 25 mg by mouth 3 (three) times daily.   No facility-administered encounter medications on file as of 10/17/2015.    Past Psychiatric History/Hospitalization(s): Anxiety: Yes Bipolar Disorder: Yes Depression: Yes Mania: No Psychosis: No Schizophrenia: No Personality Disorder: No Hospitalization for psychiatric illness: Yes History of Electroconvulsive Shock Therapy: No Prior Suicide Attempts: No  Physical Exam: Constitutional:  There were no vitals taken for this visit.  General Appearance: alert, oriented, no acute distress  Musculoskeletal: Strength & Muscle Tone: within normal limits Gait & Station: normal Patient leans: straight  Mental Status Examination/Evaluation: Objective: Attitude: Calm and cooperative  Appearance: Fairly Groomed, appears to be stated age  Patent attorney::  Fair  Speech:  Clear and Coherent and Normal Rate  Volume:  Normal  Mood:  Depressed and anxious  Affect:  Congruent   Thought Process:  Linear and Logical  Orientation:  Full (Time, Place, and Person)  Thought Content:  Negative  Suicidal  Thoughts:  No  Homicidal Thoughts:  No  Judgement:  Intact  Insight:  Fair  Concentration: good  Memory: Immediate-fair Recent-fair Remote-fair  Recall: fair  Language: fair  Gait and Station: normal  [employer] of Knowledge: average  Psychomotor Activity:  Normal  Akathisia:  No  Handed:  Right  AIMS (if indicated):  Facial and Oral Movements  Muscles of Facial Expression: None, normal  Lips and Perioral Area: None, normal  Jaw: None, normal  Tongue: None, normal Extremity Movements: Upper (arms, wrists, hands, fingers): None, normal  Lower (legs, knees, ankles, toes): None, normal,  Trunk Movements:  Neck, shoulders, hips: None, normal,  Overall Severity : Severity of abnormal movements (highest score from questions above): None, normal  Incapacitation due to abnormal movements: None, normal  Patient's awareness of abnormal movements (rate only patient's report): No Awareness, Dental Status  Current problems with teeth and/or dentures?: No  Does patient usually wear dentures?: No    Assets:  Communication Skills Desire for Improvement Financial Resources/Insurance Housing Intimacy Leisure Time Social Support TEFL teacher (Choose Three): Established Problem, Stable/Improving (1), Review of Psycho-Social Stressors (  1), Established Problem, Worsening (2), Review of Medication Regimen & Side Effects (2) and Review of New Medication or Change in Dosage (2)  Assessment: AXIS I MDD- recurrent, severe without psychotic features. R/o Bipolar II. GAD. 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   AXIS II Deferred   Treatment Plan/Recommendations:  Plan of Care: Medication management with supportive therapy. Risks/benefits and SE of the medication discussed. Pt verbalized understanding and verbal consent obtained for treatment. Affirm with the patient that the medications are taken as ordered. Patient expressed  understanding of how their medications were to be used.  - anxiety is worse, depression is better    Laboratory: labs and EKG will be done soon thru her cardiologist  Psychotherapy: Therapy: brief supportive therapy provided. Discussed psychosocial stressors in detail.    Medications: Continue Wellbutrin XL 300mg  po qD for depression Continue Ativan 1mg  po TID prn anxiety Seroquel to 200mg  po qHS for as adjunct treatment for depression,anxiety and sleep Add Seroquel 25mg  po qD prn anxiety   Routine PRN Medications: Yes  Consultations: pt does not have transportation so has not scheduled appt for therapy  Safety Concerns: Pt denies SI and is at an acute low risk for suicide.Patient told to call clinic if any problems occur. Patient advised to go to ER if they should develop SI/HI, side effects, or if symptoms worsen. Has crisis numbers to call if needed. Pt verbalized understanding.   Other: F/up in 2 months or sooner if needed    Oletta Darter, MD 10/17/2015

## 2015-10-24 ENCOUNTER — Other Ambulatory Visit: Payer: Self-pay | Admitting: Obstetrics & Gynecology

## 2015-10-24 DIAGNOSIS — R928 Other abnormal and inconclusive findings on diagnostic imaging of breast: Secondary | ICD-10-CM

## 2015-10-30 ENCOUNTER — Ambulatory Visit
Admission: RE | Admit: 2015-10-30 | Discharge: 2015-10-30 | Disposition: A | Discharge status: Home / Self Care | Payer: Medicaid Other | Source: Ambulatory Visit | Attending: Obstetrics & Gynecology | Admitting: Obstetrics & Gynecology

## 2015-10-30 DIAGNOSIS — R928 Other abnormal and inconclusive findings on diagnostic imaging of breast: Secondary | ICD-10-CM

## 2015-10-31 ENCOUNTER — Ambulatory Visit (HOSPITAL_COMMUNITY): Payer: Self-pay | Admitting: Psychiatry

## 2015-12-16 ENCOUNTER — Other Ambulatory Visit (HOSPITAL_COMMUNITY): Payer: Self-pay

## 2015-12-16 ENCOUNTER — Other Ambulatory Visit (HOSPITAL_COMMUNITY): Payer: Self-pay | Admitting: Psychiatry

## 2015-12-16 DIAGNOSIS — F410 Panic disorder [episodic paroxysmal anxiety] without agoraphobia: Secondary | ICD-10-CM

## 2015-12-16 DIAGNOSIS — G4700 Insomnia, unspecified: Secondary | ICD-10-CM

## 2015-12-16 DIAGNOSIS — F332 Major depressive disorder, recurrent severe without psychotic features: Secondary | ICD-10-CM

## 2015-12-16 DIAGNOSIS — F411 Generalized anxiety disorder: Secondary | ICD-10-CM

## 2015-12-16 MED ORDER — QUETIAPINE FUMARATE 200 MG PO TABS: 200.0000 mg | ORAL_TABLET | Freq: Every day | ORAL | 0 refills | Status: DC

## 2015-12-16 MED ORDER — BUPROPION HCL ER (XL) 150 MG PO TB24: 300.0000 mg | ORAL_TABLET | Freq: Every day | ORAL | 0 refills | Status: DC

## 2015-12-16 MED ORDER — QUETIAPINE FUMARATE 25 MG PO TABS: 25.0000 mg | ORAL_TABLET | Freq: Every day | ORAL | 0 refills | Status: DC | PRN

## 2015-12-16 MED ORDER — LORAZEPAM 1 MG PO TABS: 1.0000 mg | ORAL_TABLET | Freq: Three times a day (TID) | ORAL | 0 refills | Status: DC | PRN

## 2015-12-18 ENCOUNTER — Other Ambulatory Visit (HOSPITAL_COMMUNITY): Payer: Self-pay | Admitting: Psychiatry

## 2015-12-18 DIAGNOSIS — F410 Panic disorder [episodic paroxysmal anxiety] without agoraphobia: Secondary | ICD-10-CM

## 2015-12-18 DIAGNOSIS — F411 Generalized anxiety disorder: Secondary | ICD-10-CM

## 2015-12-26 ENCOUNTER — Ambulatory Visit (INDEPENDENT_AMBULATORY_CARE_PROVIDER_SITE_OTHER): Payer: Medicaid Other | Admitting: Psychiatry

## 2015-12-26 ENCOUNTER — Encounter (HOSPITAL_COMMUNITY): Payer: Self-pay | Admitting: Psychiatry

## 2015-12-26 DIAGNOSIS — G4700 Insomnia, unspecified: Secondary | ICD-10-CM | POA: Diagnosis not present

## 2015-12-26 DIAGNOSIS — F410 Panic disorder [episodic paroxysmal anxiety] without agoraphobia: Secondary | ICD-10-CM

## 2015-12-26 DIAGNOSIS — F411 Generalized anxiety disorder: Secondary | ICD-10-CM

## 2015-12-26 DIAGNOSIS — F332 Major depressive disorder, recurrent severe without psychotic features: Secondary | ICD-10-CM

## 2015-12-26 MED ORDER — BUPROPION HCL ER (XL) 150 MG PO TB24: 300.0000 mg | ORAL_TABLET | Freq: Every day | ORAL | 3 refills | Status: DC

## 2015-12-26 MED ORDER — QUETIAPINE FUMARATE 25 MG PO TABS: 25.0000 mg | ORAL_TABLET | Freq: Every day | ORAL | 3 refills | Status: DC | PRN

## 2015-12-26 MED ORDER — QUETIAPINE FUMARATE 200 MG PO TABS: 200.0000 mg | ORAL_TABLET | Freq: Every day | ORAL | 3 refills | Status: DC

## 2015-12-26 MED ORDER — LORAZEPAM 1 MG PO TABS: 1.0000 mg | ORAL_TABLET | Freq: Three times a day (TID) | ORAL | 3 refills | Status: DC | PRN
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
## 2016-04-01 ENCOUNTER — Telehealth (HOSPITAL_COMMUNITY): Payer: Self-pay | Admitting: *Deleted

## 2016-04-01 NOTE — Telephone Encounter (Signed)
Prior authorization for Quetiapine fumarate received. Called [place] spoke with [place] who gave approval [id number]. Called to notify pharmacy, it went through without problems.

## 2016-04-15 ENCOUNTER — Other Ambulatory Visit (HOSPITAL_COMMUNITY): Payer: Self-pay | Admitting: Psychiatry

## 2016-04-15 DIAGNOSIS — F332 Major depressive disorder, recurrent severe without psychotic features: Secondary | ICD-10-CM

## 2016-04-16 ENCOUNTER — Ambulatory Visit (HOSPITAL_COMMUNITY): Payer: Self-pay | Admitting: Psychiatry

## 2016-04-16 ENCOUNTER — Other Ambulatory Visit: Payer: Self-pay | Admitting: Obstetrics & Gynecology

## 2016-04-16 DIAGNOSIS — R921 Mammographic calcification found on diagnostic imaging of breast: Secondary | ICD-10-CM

## 2016-04-17 ENCOUNTER — Telehealth: Payer: Self-pay | Admitting: Cardiology

## 2016-04-17 NOTE — Telephone Encounter (Signed)
Received records from [facility] for appointment on 05/07/16 with Dr Jens Som.  Records given to [facility] (medical records) for Dr Ludwig Clarks schedule on 05/07/16.

## 2016-04-28 NOTE — Progress Notes (Signed)
HPI: FU palpitations. Echocardiogram in 09/2012 demonstrated normal LV function and grade 1 diastolic dysfunction. Holter monitor 09/2012 demonstrated NSR and sinus tachycardia. CTA in June of 2015 showed no pulmonary embolus. Nuclear study repeated in July 2015. Ejection fraction 57%. There was felt to be diaphragmatic attenuation but slight ischemia could not be excluded; I reviewed and felt no ischemia. Since last seen, she occasionally has dyspnea. She continues to have intermittent chest pain when she is stressed but does not have exertional chest pain. There is no orthopnea, PND, pedal edema or syncope. Her palpitations are reasonably well controlled.  Current Outpatient Prescriptions  Medication Sig Dispense Refill  . buPROPion (WELLBUTRIN XL) 150 MG 24 hr tablet Take 2 tablets (300 mg total) by mouth daily. 60 tablet 3  . cetirizine (ZYRTEC) 10 MG tablet Take 10 mg by mouth daily.    . fluticasone (FLONASE) 50 MCG/ACT nasal spray Place 2 sprays into both nostrils daily.    . hydrocortisone valerate ointment (WESTCORT) 0.2 % Apply topically 2 (two) times daily. 45 g 0  . LORazepam (ATIVAN) 1 MG tablet Take 1 tablet (1 mg total) by mouth 3 (three) times daily as needed for anxiety. 90 tablet 3  . metoprolol succinate (TOPROL-XL) 25 MG 24 hr tablet Take 3 tablets (75 mg total) by mouth daily. 90 tablet 12  . omeprazole (PRILOSEC) 40 MG capsule Take 40 mg by mouth daily.    . ondansetron (ZOFRAN) 4 MG tablet Take 2 tablets (8 mg total) by mouth every 8 (eight) hours as needed for nausea. 30 tablet 0  . QUEtiapine (SEROQUEL) 200 MG tablet Take 1 tablet (200 mg total) by mouth at bedtime. 30 tablet 3  . QUEtiapine (SEROQUEL) 25 MG tablet Take 1 tablet (25 mg total) by mouth daily as needed (anxiety). 30 tablet 3  . topiramate (TOPAMAX) 25 MG tablet Take 25 mg by mouth 3 (three) times daily.     No current facility-administered medications for this visit.      Past Medical History:    Diagnosis Date  . Allergy   . Anemia   . Anxiety   . Barrett esophagus   . Bipolar disorder (HCC)   . Depression   . Esophageal reflux   . Fibromyalgia   . Hiatal hernia   . Hx of cardiovascular stress test    ETT-Myoview (11/2013):  Apical inferior reversibility (diaph atten vs very slight ischemia), normal wall motion, EF 57%; low risk  . Iron deficiency anemia, unspecified   . Irritable bowel syndrome   . Kidney infection   . Lupus   . MS (multiple sclerosis) (HCC)   . RA (rheumatoid arthritis) (HCC)   . Tachycardia   . Ulcerative (chronic) proctitis [facility](HCC)     Past Surgical History:  Procedure Laterality Date  . APPENDECTOMY    . CESAREAN SECTION    . MANDIBLE FRACTURE SURGERY    . NASAL SINUS SURGERY    . PARTIAL HYSTERECTOMY      Social History   Social History  . Marital status: Divorced    Spouse name: N/A  . Number of children: 2  . Years of education: N/A   Occupational History  . Student    Social History Main Topics  . Smoking status: Never Smoker  . Smokeless tobacco: Never Used  . Alcohol use No     Comment: occasional- afew times a year (2-3 glasses of wine)  . Drug use: No  . Sexual activity:  Yes    Birth control/ protection: Surgical   Other Topics Concern  . Not on file   Social History Narrative  . No narrative on file    Family History  Problem Relation Age of Onset  . Diabetes Paternal Grandmother   . Diabetes Paternal Grandfather   . ADD / ADHD Son   . Depression Mother   . Suicidality Neg Hx   . Anxiety disorder Neg Hx   . Bipolar disorder Neg Hx     ROS: no fevers or chills, productive cough, hemoptysis, dysphasia, odynophagia, melena, hematochezia, dysuria, hematuria, rash, seizure activity, orthopnea, PND, pedal edema, claudication. Remaining systems are negative.  Physical Exam: Well-developed well-nourished in no acute distress.  Skin is warm and dry.  HEENT is normal.  Neck is supple.  Chest is clear to  auscultation with normal expansion.  Cardiovascular exam is regular rate and rhythm.  Abdominal exam nontender or distended. No masses palpated. Extremities show no edema. neuro grossly intact  ECG-Sinus rhythm at a rate of 86. Left anterior fascicular block. Cannot rule out prior anterior infarct.  A/P  1 tachycardia-plan to continue metoprolol. Symptoms reasonably well controlled.  2 chest pain-symptoms are atypical. Previous nuclear study showed no ischemia. I don't think further cardiac workup is indicated.  Olga Millers, MD

## 2016-05-07 ENCOUNTER — Encounter: Payer: Self-pay | Admitting: Cardiology

## 2016-05-07 ENCOUNTER — Ambulatory Visit (INDEPENDENT_AMBULATORY_CARE_PROVIDER_SITE_OTHER): Payer: Medicaid Other | Admitting: Cardiology

## 2016-05-07 VITALS — BP 94/60 | HR 86 | Ht 66.0 in | Wt 135.0 lb

## 2016-05-07 DIAGNOSIS — R072 Precordial pain: Secondary | ICD-10-CM | POA: Diagnosis not present

## 2016-05-07 DIAGNOSIS — R002 Palpitations: Secondary | ICD-10-CM

## 2016-05-07 NOTE — Patient Instructions (Signed)
Your physician recommends that you schedule a follow-up appointment in: 1 year with Dr. Crenshaw. 

## 2016-05-11 ENCOUNTER — Ambulatory Visit
Admission: RE | Admit: 2016-05-11 | Discharge: 2016-05-11 | Disposition: A | Discharge status: Home / Self Care | Payer: Medicaid Other | Source: Ambulatory Visit | Attending: Obstetrics & Gynecology | Admitting: Obstetrics & Gynecology

## 2016-05-11 DIAGNOSIS — R921 Mammographic calcification found on diagnostic imaging of breast: Secondary | ICD-10-CM

## 2016-05-19 ENCOUNTER — Other Ambulatory Visit (HOSPITAL_COMMUNITY): Payer: Self-pay

## 2016-05-19 DIAGNOSIS — F332 Major depressive disorder, recurrent severe without psychotic features: Secondary | ICD-10-CM

## 2016-05-19 DIAGNOSIS — G4709 Other insomnia: Secondary | ICD-10-CM

## 2016-05-19 DIAGNOSIS — F410 Panic disorder [episodic paroxysmal anxiety] without agoraphobia: Secondary | ICD-10-CM

## 2016-05-19 DIAGNOSIS — F411 Generalized anxiety disorder: Secondary | ICD-10-CM

## 2016-05-19 MED ORDER — QUETIAPINE FUMARATE 200 MG PO TABS: 200.0000 mg | ORAL_TABLET | Freq: Every day | ORAL | 0 refills | Status: DC

## 2016-05-19 MED ORDER — LORAZEPAM 1 MG PO TABS: 1.0000 mg | ORAL_TABLET | Freq: Three times a day (TID) | ORAL | 0 refills | Status: DC | PRN

## 2016-05-19 MED ORDER — BUPROPION HCL ER (XL) 150 MG PO TB24: 300.0000 mg | ORAL_TABLET | Freq: Every day | ORAL | 0 refills | Status: DC

## 2016-05-19 MED ORDER — QUETIAPINE FUMARATE 25 MG PO TABS: 25.0000 mg | ORAL_TABLET | Freq: Every day | ORAL | 0 refills | Status: DC | PRN

## 2016-06-02 ENCOUNTER — Encounter (HOSPITAL_COMMUNITY): Payer: Self-pay | Admitting: Psychiatry

## 2016-06-02 ENCOUNTER — Ambulatory Visit (INDEPENDENT_AMBULATORY_CARE_PROVIDER_SITE_OTHER): Payer: Medicaid Other | Admitting: Psychiatry

## 2016-06-02 DIAGNOSIS — F411 Generalized anxiety disorder: Secondary | ICD-10-CM

## 2016-06-02 DIAGNOSIS — Z818 Family history of other mental and behavioral disorders: Secondary | ICD-10-CM | POA: Diagnosis not present

## 2016-06-02 DIAGNOSIS — F410 Panic disorder [episodic paroxysmal anxiety] without agoraphobia: Secondary | ICD-10-CM

## 2016-06-02 DIAGNOSIS — G4709 Other insomnia: Secondary | ICD-10-CM | POA: Diagnosis not present

## 2016-06-02 DIAGNOSIS — Z79899 Other long term (current) drug therapy: Secondary | ICD-10-CM | POA: Diagnosis not present

## 2016-06-02 DIAGNOSIS — F332 Major depressive disorder, recurrent severe without psychotic features: Secondary | ICD-10-CM | POA: Diagnosis not present

## 2016-06-02 DIAGNOSIS — Z833 Family history of diabetes mellitus: Secondary | ICD-10-CM

## 2016-06-02 MED ORDER — QUETIAPINE FUMARATE 200 MG PO TABS: 200.0000 mg | ORAL_TABLET | Freq: Every day | ORAL | 2 refills | Status: DC

## 2016-06-02 MED ORDER — LORAZEPAM 1 MG PO TABS: 1.0000 mg | ORAL_TABLET | Freq: Three times a day (TID) | ORAL | 2 refills | Status: DC | PRN

## 2016-06-02 MED ORDER — QUETIAPINE FUMARATE 25 MG PO TABS: 25.0000 mg | ORAL_TABLET | Freq: Every day | ORAL | 2 refills | Status: DC | PRN

## 2016-06-02 MED ORDER — BUPROPION HCL ER (XL) 150 MG PO TB24: 450.0000 mg | ORAL_TABLET | Freq: Every day | ORAL | 2 refills | Status: DC

## 2016-06-02 NOTE — Progress Notes (Signed)
Patient ID: Brandi Santiago, female   DOB: 06/22/1970, 46 y.o.   MRN: [id number] Patient ID: Brandi Santiago, female   DOB: 06/09/1970, 46 y.o.   MRN: [id number]  [facility] [id number] Progress Note  Brandi Santiago [id number] 46 y.o.  06/02/2016 10:21 AM  Chief Complaint: "the same"  History of Present Illness: reviewed information below with patient on 06/02/16 and same as previous visits except as noted  Pt now see's her kids every other weekend. Its going well.   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   Anxiety remains unchanged. Pt is taking Ativan TID and it calms her within 15 min.  Reports racing thoughts, headaches, body aches, fatigue and palpiations due to anxiety.  She is having more panic attacks- at least 2x/week.   Depression is a little worse. Reports cying spells, is spending a lot of time in bed due to low energy and motivation, [place]. Denies hopelessness and worthlessness.   Pt is gettitng about 9-10 hrs of sleep per night with Seroquel 200mg . Energy is low. Appetite is ok and concentration is so/so.  Denies manic and hypomanic symptoms including periods of decreased need for sleep, increased energy, mood lability, impulsivity, FOI, and excessive spending.  Taking Wellbutrin, Ativan and Seroquel as prescribed and denies SE. Seroquel helps to control the severity of her panic attacks.   Suicidal Ideation: No Plan Formed: No Patient has means to carry out plan: No  Homicidal Ideation: No Plan Formed: No Patient has means to carry out plan: No  Review of Systems: Psychiatric: Agitation: Yes Hallucination: No Depressed Mood: Yes Insomnia: No Hypersomnia: No Altered Concentration: Yes states she is easily distracted Feels Worthless: No Grandiose Ideas: No Belief In Special Powers: No New/Increased Substance Abuse: No Compulsions: No    Review of Systems  HENT: Positive for congestion and sore throat. Negative for nosebleeds and sinus  pain.   Gastrointestinal: Positive for diarrhea and nausea. Negative for abdominal pain, heartburn and vomiting.  Neurological: Positive for headaches. Negative for dizziness, tremors, sensory change, seizures and loss of consciousness.  Psychiatric/Behavioral: Positive for depression. Negative for hallucinations, substance abuse and suicidal ideas. The patient is nervous/anxious. The patient does not have insomnia.      Past Medical Family, Social History: reviewed information below with patient on 06/02/16 and same as previous visits except as noted  lives in [place] with fiance. Raised by parents, no siblings. Had a great childhood. Pt is divorced x2. She has 2 kids- 1 boy and 1 girl. She is unemployed and last worked Dec 2006.  reports that she has never smoked. She has never used smokeless tobacco. She reports that she does not drink alcohol or use drugs.  Family History  Problem Relation Age of Onset  . Diabetes Paternal Grandmother   . Diabetes Paternal Grandfather   . ADD / ADHD Son   . Depression Mother   . Suicidality Neg Hx   . Anxiety disorder Neg Hx   . Bipolar disorder Neg Hx     Past Medical History:  Diagnosis Date  . Allergy   . Anemia   . Anxiety   . Barrett esophagus   . Bipolar disorder (HCC)   . Depression   . Esophageal reflux   . Fibromyalgia   . Hiatal hernia   . Hx of cardiovascular stress test    ETT-Myoview (11/2013):  Apical inferior reversibility (diaph atten vs very slight ischemia), normal wall motion, EF 57%; low  risk  . Iron deficiency anemia, unspecified   . Irritable bowel syndrome   . Kidney infection   . Lupus   . MS (multiple sclerosis) (HCC)   . RA (rheumatoid arthritis) (HCC)   . Tachycardia   . Ulcerative (chronic) proctitis [facility])      Outpatient Encounter Prescriptions as of 06/02/2016  Medication Sig  . buPROPion (WELLBUTRIN XL) 150 MG 24 hr tablet Take 2 tablets (300 mg total) by mouth daily.  . cetirizine (ZYRTEC) 10 MG  tablet Take 10 mg by mouth daily.  . fluticasone (FLONASE) 50 MCG/ACT nasal spray Place 2 sprays into both nostrils daily.  . hydrocortisone valerate ointment (WESTCORT) 0.2 % Apply topically 2 (two) times daily.  Marland Kitchen LORazepam (ATIVAN) 1 MG tablet Take 1 tablet (1 mg total) by mouth 3 (three) times daily as needed for anxiety.  . metoprolol succinate (TOPROL-XL) 25 MG 24 hr tablet Take 3 tablets (75 mg total) by mouth daily.  Marland Kitchen omeprazole (PRILOSEC) 40 MG capsule Take 40 mg by mouth daily.  . ondansetron (ZOFRAN) 4 MG tablet Take 2 tablets (8 mg total) by mouth every 8 (eight) hours as needed for nausea.  Marland Kitchen QUEtiapine (SEROQUEL) 200 MG tablet Take 1 tablet (200 mg total) by mouth at bedtime.  Marland Kitchen QUEtiapine (SEROQUEL) 25 MG tablet Take 1 tablet (25 mg total) by mouth daily as needed (anxiety).  . topiramate (TOPAMAX) 25 MG tablet Take 25 mg by mouth 3 (three) times daily.   No facility-administered encounter medications on file as of 06/02/2016.     Past Psychiatric History/Hospitalization(s): Anxiety: Yes Bipolar Disorder: Yes Depression: Yes Mania: No Psychosis: No Schizophrenia: No Personality Disorder: No Hospitalization for psychiatric illness: Yes History of Electroconvulsive Shock Therapy: No Prior Suicide Attempts: No  Physical Exam: Constitutional:  BP 110/68   Pulse 92   Ht 5\' 6"  (1.676 m)   Wt 129 lb 9.6 oz (58.8 kg)   BMI 20.92 kg/m   General Appearance: alert, oriented, no acute distress  Musculoskeletal: Strength & Muscle Tone: within normal limits Gait & Station: normal Patient leans: straight  Mental Status Examination/Evaluation: reviewed MSE on 06/02/16 and same as previous visits except as noted  Objective: Attitude: Calm and cooperative  Appearance: Fairly Groomed, appears to be stated age  Patent attorney::  Fair  Speech:  Clear and Coherent and Normal Rate  Volume:  Normal  Mood:  Depressed and anxious  Affect:  Congruent   Thought Process:  Linear and  Logical  Orientation:  Full (Time, Place, and Person)  Thought Content:  Negative  Suicidal Thoughts:  No  Homicidal Thoughts:  No  Judgement:  Intact  Insight:  Fair  Concentration: good  Memory: Immediate-fair Recent-fair Remote-fair  Recall: fair  Language: fair  Gait and Station: normal  [employer] of Knowledge: average  Psychomotor Activity:  Normal  Akathisia:  No  Handed:  Right  AIMS (if indicated):  Facial and Oral Movements  Muscles of Facial Expression: None, normal  Lips and Perioral Area: None, normal  Jaw: None, normal  Tongue: None, normal Extremity Movements: Upper (arms, wrists, hands, fingers): None, normal  Lower (legs, knees, ankles, toes): None, normal,  Trunk Movements:  Neck, shoulders, hips: None, normal,  Overall Severity : Severity of abnormal movements (highest score from questions above): None, normal  Incapacitation due to abnormal movements: None, normal  Patient's awareness of abnormal movements (rate only patient's report): No Awareness, Dental Status  Current problems with teeth and/or dentures?: No  Does  patient usually wear dentures?: No    Assets:  Communication Skills Desire for Improvement Financial Resources/Insurance Housing Intimacy Leisure Time Social Support Talents/Skills Transportation     reviewed A&P on 06/02/16 and same as previous visits except as noted  Assessment: AXIS I MDD- recurrent, severe without psychotic features. R/o Bipolar II. GAD. 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 ; ADD by hx  AXIS II Deferred   Treatment Plan/Recommendations:  Plan of Care: Medication management with supportive therapy. Risks/benefits and SE of the medication discussed. Pt verbalized understanding and verbal consent obtained for treatment. Affirm with the patient that the medications are taken as ordered. Patient expressed understanding of how their medications were to be used.   - will obtain prior psych record from  [facility] in regards to her previous dx of ADD    Laboratory:EKG 05/07/16 QTc 473   Psychotherapy: Therapy: brief supportive therapy provided. Discussed psychosocial stressors in detail.    Medications: increase Wellbutrin XL 450mg  po qD for depression Continue Ativan 1mg  po TID prn anxiety Seroquel  200mg  po qHS for as adjunct treatment for depression,anxiety and sleep Seroquel 25mg  po qD prn anxiety   Routine PRN Medications: Yes  Consultations: pt does not have transportation so has not scheduled appt for therapy  Safety Concerns: Pt denies SI and is at an acute low risk for suicide.Patient told to call clinic if any problems occur. Patient advised to go to ER if they should develop SI/HI, side effects, or if symptoms worsen. Has crisis numbers to call if needed. Pt verbalized understanding.   Other: F/up in 3 months or sooner if needed    Oletta Darter, MD 06/02/2016

## 2016-06-10 ENCOUNTER — Telehealth (HOSPITAL_COMMUNITY): Payer: Self-pay
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
## 2016-06-11 NOTE — Telephone Encounter (Signed)
No early refills on controlled substances

## 2016-06-16 ENCOUNTER — Telehealth (HOSPITAL_COMMUNITY): Payer: Self-pay
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
## 2016-06-16 NOTE — Telephone Encounter (Signed)
I believe that she is a patient of Dr. Pincus Sanes and will forward this message to her. In the meantime, it would be helpful to check in with the patient to assess for safety so that we can have more information.

## 2016-06-18 NOTE — Telephone Encounter (Signed)
Does the patient want to come in for an earlier appointment?

## 2016-06-18 NOTE — Telephone Encounter (Signed)
She is due to come in April, I am hesitant to call her because I do not think she knows that her father in law called [place] and I do not want to jeopardize her living situation. Please advise, thank you

## 2016-07-30 ENCOUNTER — Encounter: Payer: Self-pay | Admitting: Cardiology

## 2016-07-31 ENCOUNTER — Other Ambulatory Visit: Payer: Self-pay | Admitting: *Deleted

## 2016-07-31 MED ORDER — METOPROLOL SUCCINATE ER 25 MG PO TB24: 75.0000 mg | ORAL_TABLET | Freq: Every day | ORAL | 12 refills | Status: DC

## 2016-08-27 ENCOUNTER — Ambulatory Visit (INDEPENDENT_AMBULATORY_CARE_PROVIDER_SITE_OTHER): Payer: Medicaid Other | Admitting: Psychiatry

## 2016-08-27 ENCOUNTER — Encounter (HOSPITAL_COMMUNITY): Payer: Self-pay | Admitting: Psychiatry

## 2016-08-27 DIAGNOSIS — F411 Generalized anxiety disorder: Secondary | ICD-10-CM | POA: Diagnosis not present

## 2016-08-27 DIAGNOSIS — F332 Major depressive disorder, recurrent severe without psychotic features: Secondary | ICD-10-CM

## 2016-08-27 DIAGNOSIS — F410 Panic disorder [episodic paroxysmal anxiety] without agoraphobia: Secondary | ICD-10-CM

## 2016-08-27 DIAGNOSIS — Z79899 Other long term (current) drug therapy: Secondary | ICD-10-CM

## 2016-08-27 DIAGNOSIS — G4709 Other insomnia: Secondary | ICD-10-CM | POA: Diagnosis not present

## 2016-08-27 DIAGNOSIS — Z818 Family history of other mental and behavioral disorders: Secondary | ICD-10-CM | POA: Diagnosis not present

## 2016-08-27 MED ORDER — QUETIAPINE FUMARATE 200 MG PO TABS: 200.0000 mg | ORAL_TABLET | Freq: Every day | ORAL | 2 refills | Status: DC

## 2016-08-27 MED ORDER — BUPROPION HCL ER (XL) 150 MG PO TB24: 450.0000 mg | ORAL_TABLET | Freq: Every day | ORAL | 2 refills | Status: DC

## 2016-08-27 MED ORDER — QUETIAPINE FUMARATE 25 MG PO TABS: 25.0000 mg | ORAL_TABLET | Freq: Every day | ORAL | 2 refills | Status: DC | PRN

## 2016-08-27 MED ORDER — LORAZEPAM 1 MG PO TABS: 1.0000 mg | ORAL_TABLET | Freq: Three times a day (TID) | ORAL | 2 refills | Status: DC | PRN

## 2016-08-27 NOTE — Progress Notes (Signed)
BH MD/PA/NP OP Progress Note  08/27/2016 4:00 PM Brandi Santiago  MRN:  [id number]  Chief Complaint:  Chief Complaint    Follow-up      HPI: Anxiety and depression are a little worse due to her kids.   She reports ongoing depression with low motivation, anhedonia, spending all day in bed, isolation. Denies SI/HI.  Sleep is good. Energy is low.  Denies manic and hypomanic symptoms including periods of decreased need for sleep, increased energy, mood lability, impulsivity, FOI, and excessive spending.  Pt continues to have stress induced panic attacks. With the extra Seroquel her panic attacks are more controllable.   Pt has racing thoughts, how sweats, restlessness several times a week. States anxiety is unchanged overall. She takes Ativan TID and states without it "I would be in a mental institute".   Taking meds as prescribed and denies SE.     Visit Diagnosis:    ICD-9-CM ICD-10-CM   1. GAD (generalized anxiety disorder) 300.02 F41.1   2. Panic disorder without agoraphobia 300.01 F41.0   3. Major depressive disorder, recurrent, severe without psychotic features (HCC) 296.33 F33.2   4. Other insomnia 780.52 G47.09     Past Psychiatric History:  Anxiety: Yes Bipolar Disorder: Yes Depression: Yes Mania: No Psychosis: No Schizophrenia: No Personality Disorder: No Hospitalization for psychiatric illness: Yes History of Electroconvulsive Shock Therapy: No Prior Suicide Attempts: No   Past Medical History:  Past Medical History:  Diagnosis Date  . Allergy   . Anemia   . Anxiety   . Barrett esophagus   . Bipolar disorder (HCC)   . Depression   . Esophageal reflux   . Fibromyalgia   . Hiatal hernia   . Hx of cardiovascular stress test    ETT-Myoview (11/2013):  Apical inferior reversibility (diaph atten vs very slight ischemia), normal wall motion, EF 57%; low risk  . Iron deficiency anemia, unspecified   . Irritable bowel syndrome   . Kidney infection   . Lupus    . MS (multiple sclerosis) (HCC)   . RA (rheumatoid arthritis) (HCC)   . Tachycardia   . 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 APPENDECTOMY    . CESAREAN SECTION    . MANDIBLE FRACTURE SURGERY    . NASAL SINUS SURGERY    . PARTIAL HYSTERECTOMY      Family Psychiatric and Medical History:  Family History  Problem Relation Age of Onset  . Diabetes Paternal Grandmother   . Diabetes Paternal Grandfather   . ADD / ADHD Son   . Depression Mother   . Suicidality Neg Hx   . Anxiety disorder Neg Hx   . Bipolar disorder Neg Hx     Social History:  Social History   Social History  . Marital status: Divorced    Spouse name: N/A  . Number of children: 2  . Years of education: N/A   Occupational History  . Student    Social History Main Topics  . Smoking status: Never Smoker  . Smokeless tobacco: Never Used  . Alcohol use No     Comment: occasional- afew times a year (2-3 glasses of wine)  . Drug use: No  . Sexual activity: Yes    Birth control/ protection: Surgical   Other Topics Concern  . None   Social History Narrative  . None    Allergies: No Known Allergies  Metabolic Disorder Labs: No results found for:  HGBA1C, MPG No results found for: PROLACTIN No results found for: CHOL, TRIG, HDL, CHOLHDL, VLDL, LDLCALC   Current Medications: Current Outpatient Prescriptions  Medication Sig Dispense Refill  . buPROPion (WELLBUTRIN XL) 150 MG 24 hr tablet Take 3 tablets (450 mg total) by mouth daily. 90 tablet 2  . cetirizine (ZYRTEC) 10 MG tablet Take 10 mg by mouth daily.    . fluticasone (FLONASE) 50 MCG/ACT nasal spray Place 2 sprays into both nostrils daily.    . hydrocortisone valerate ointment (WESTCORT) 0.2 % Apply topically 2 (two) times daily. 45 g 0  . LORazepam (ATIVAN) 1 MG tablet Take 1 tablet (1 mg total) by mouth 3 (three) times daily as needed for anxiety. 90 tablet 2  . metoprolol succinate  (TOPROL-XL) 25 MG 24 hr tablet Take 3 tablets (75 mg total) by mouth daily. 90 tablet 12  . omeprazole (PRILOSEC) 40 MG capsule Take 40 mg by mouth daily.    . ondansetron (ZOFRAN) 4 MG tablet Take 2 tablets (8 mg total) by mouth every 8 (eight) hours as needed for nausea. 30 tablet 0  . QUEtiapine (SEROQUEL) 200 MG tablet Take 1 tablet (200 mg total) by mouth at bedtime. 30 tablet 2  . QUEtiapine (SEROQUEL) 25 MG tablet Take 1 tablet (25 mg total) by mouth daily as needed (anxiety). 30 tablet 2  . topiramate (TOPAMAX) 25 MG tablet Take 25 mg by mouth 3 (three) times daily.     No current facility-administered medications for this visit.     Musculoskeletal: Strength & Muscle Tone: within normal limits Gait & Station: normal Patient leans: N/A  Psychiatric Specialty Exam: Review of Systems  HENT: Positive for congestion and sinus pain. Negative for ear discharge, ear pain and nosebleeds.   Neurological: Positive for headaches. Negative for dizziness, tingling and tremors.  Endo/Heme/Allergies: Positive for environmental allergies. Negative for polydipsia. Does not bruise/bleed easily.  Psychiatric/Behavioral: Positive for depression. Negative for hallucinations, substance abuse and suicidal ideas. The patient is nervous/anxious. The patient does not have insomnia.     Blood pressure 106/73, pulse 87, height 5\' 6"  (1.676 m), weight 126 lb 12.8 oz (57.5 kg).Body mass index is 20.47 kg/m.  General Appearance: Casual  Eye Contact:  Good  Speech:  Clear and Coherent and Normal Rate  Volume:  Normal  Mood:  Anxious and Depressed  Affect:  Congruent  Thought Process:  Goal Directed, Linear and Descriptions of Associations: Intact  Orientation:  Full (Time, Place, and Person)  Thought Content: Logical   Suicidal Thoughts:  No  Homicidal Thoughts:  No  Memory:  Immediate;   Good Recent;   Good Remote;   Good  Judgement:  Fair  Insight:  Fair  Psychomotor Activity:  Normal   Concentration:  Concentration: Good and Attention Span: Good  Recall:  Good  Fund of Knowledge: Good  Language: Good  Akathisia:  No  Handed:  Right  AIMS (if indicated):  AIMS:  Facial and Oral Movements  Muscles of Facial Expression: None, normal  Lips and Perioral Area: None, normal  Jaw: None, normal  Tongue: None, normal Extremity Movements: Upper (arms, wrists, hands, fingers): None, normal  Lower (legs, knees, ankles, toes): None, normal,  Trunk Movements:  Neck, shoulders, hips: None, normal,  Overall Severity : Severity of abnormal movements (highest score from questions above): None, normal  Incapacitation due to abnormal movements: None, normal  Patient's awareness of abnormal movements (rate only patient's report): No Awareness, Dental Status  Current problems with  teeth and/or dentures?: No  Does patient usually wear dentures?: No     Assets:  Communication Skills Desire for Improvement  ADL's:  Intact  Cognition: WNL  Sleep:  good     Treatment Plan Summary:Medication management  Assessment: MDD-recurrent, severe without psychotic features vs Bipolar II disorder; GAD; Panic disorder without agoraphobia; ADD by hx   Medication management with supportive therapy. Risks/benefits and SE of the medication discussed. Pt verbalized understanding and verbal consent obtained for treatment.  Affirm with the patient that the medications are taken as ordered. Patient expressed understanding of how their medications were to be used.   Meds: Wellbutrin XL 450mg  po qD for depression and focus Ativan 1mg  po TID prn anxiety Seroquel 200mg  po qHS for depression, anxiety and sleep Seroquel 25mg  po qD prn anxiety   Labs:none   Therapy: brief supportive therapy provided. Discussed psychosocial stressors in detail.     Consultations:  None  Pt denies SI and is at an acute low risk for suicide. Patient told to call clinic if any problems occur. Patient advised to go to  ER if they should develop SI/HI, side effects, or if symptoms worsen. Has crisis numbers to call if needed. Pt verbalized understanding.  F/up in 3 months or sooner if needed   Oletta Darter, MD 08/27/2016, 4:00 PM

## 2016-10-14 ENCOUNTER — Encounter: Payer: Self-pay | Admitting: *Deleted

## 2016-10-14 NOTE — Telephone Encounter (Signed)
This encounter was created in error - please disregard.

## 2016-10-16 ENCOUNTER — Other Ambulatory Visit: Payer: Self-pay | Admitting: Obstetrics & Gynecology

## 2016-10-16 DIAGNOSIS — N6489 Other specified disorders of breast: Secondary | ICD-10-CM

## 2016-11-18 ENCOUNTER — Ambulatory Visit
Admission: RE | Admit: 2016-11-18 | Discharge: 2016-11-18 | Disposition: A | Discharge status: Home / Self Care | Payer: Medicaid Other | Source: Ambulatory Visit | Attending: Obstetrics & Gynecology | Admitting: Obstetrics & Gynecology

## 2016-11-18 DIAGNOSIS — N6489 Other specified disorders of breast: Secondary | ICD-10-CM

## 2016-11-26 ENCOUNTER — Encounter (HOSPITAL_COMMUNITY): Payer: Self-pay | Admitting: Psychiatry

## 2016-11-26 ENCOUNTER — Ambulatory Visit (INDEPENDENT_AMBULATORY_CARE_PROVIDER_SITE_OTHER): Payer: Medicaid Other | Admitting: Psychiatry

## 2016-11-26 DIAGNOSIS — F411 Generalized anxiety disorder: Secondary | ICD-10-CM

## 2016-11-26 DIAGNOSIS — G4709 Other insomnia: Secondary | ICD-10-CM | POA: Diagnosis not present

## 2016-11-26 DIAGNOSIS — Z818 Family history of other mental and behavioral disorders: Secondary | ICD-10-CM

## 2016-11-26 DIAGNOSIS — F332 Major depressive disorder, recurrent severe without psychotic features: Secondary | ICD-10-CM

## 2016-11-26 DIAGNOSIS — F410 Panic disorder [episodic paroxysmal anxiety] without agoraphobia: Secondary | ICD-10-CM

## 2016-11-26 MED ORDER — LORAZEPAM 1 MG PO TABS: 1.0000 mg | ORAL_TABLET | Freq: Three times a day (TID) | ORAL | 2 refills | Status: DC | PRN

## 2016-11-26 MED ORDER — QUETIAPINE FUMARATE 25 MG PO TABS: 25.0000 mg | ORAL_TABLET | Freq: Every day | ORAL | 2 refills | Status: DC | PRN

## 2016-11-26 MED ORDER — QUETIAPINE FUMARATE 200 MG PO TABS: 200.0000 mg | ORAL_TABLET | Freq: Every day | ORAL | 2 refills | Status: DC

## 2016-11-26 MED ORDER — BUPROPION HCL ER (XL) 150 MG PO TB24: 450.0000 mg | ORAL_TABLET | Freq: Every day | ORAL | 2 refills | Status: DC
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
## 2017-01-21 ENCOUNTER — Encounter (HOSPITAL_COMMUNITY): Payer: Self-pay | Admitting: Psychiatry

## 2017-01-21 ENCOUNTER — Ambulatory Visit (INDEPENDENT_AMBULATORY_CARE_PROVIDER_SITE_OTHER): Payer: Medicaid Other | Admitting: Psychiatry

## 2017-01-21 DIAGNOSIS — K5909 Other constipation: Secondary | ICD-10-CM

## 2017-01-21 DIAGNOSIS — F411 Generalized anxiety disorder: Secondary | ICD-10-CM | POA: Diagnosis not present

## 2017-01-21 DIAGNOSIS — F410 Panic disorder [episodic paroxysmal anxiety] without agoraphobia: Secondary | ICD-10-CM

## 2017-01-21 DIAGNOSIS — Z818 Family history of other mental and behavioral disorders: Secondary | ICD-10-CM

## 2017-01-21 DIAGNOSIS — F332 Major depressive disorder, recurrent severe without psychotic features: Secondary | ICD-10-CM

## 2017-01-21 DIAGNOSIS — R109 Unspecified abdominal pain: Secondary | ICD-10-CM

## 2017-01-21 DIAGNOSIS — G4709 Other insomnia: Secondary | ICD-10-CM | POA: Diagnosis not present

## 2017-01-21 DIAGNOSIS — R450 Nervousness: Secondary | ICD-10-CM

## 2017-01-21 MED ORDER — LORAZEPAM 1 MG PO TABS: 1.0000 mg | ORAL_TABLET | Freq: Three times a day (TID) | ORAL | 1 refills | Status: DC | PRN

## 2017-01-21 MED ORDER — BUPROPION HCL ER (XL) 150 MG PO TB24: 450.0000 mg | ORAL_TABLET | Freq: Every day | ORAL | 1 refills | Status: DC

## 2017-01-21 MED ORDER — QUETIAPINE FUMARATE 25 MG PO TABS: 25.0000 mg | ORAL_TABLET | Freq: Every day | ORAL | 1 refills | Status: DC | PRN

## 2017-01-21 MED ORDER — QUETIAPINE FUMARATE 200 MG PO TABS: 200.0000 mg | ORAL_TABLET | Freq: Every day | ORAL | 1 refills | Status: DC

## 2017-01-21 NOTE — Progress Notes (Signed)
BH MD/PA/NP OP Progress Note  01/21/2017 4:06 PM Brandi Santiago  MRN:  [id number]  Chief Complaint:  Chief Complaint    Follow-up      HPI: Pt's father's fiance died one week ago. They did not like each other. Her fiance is not handeling it well. Pt remains concerned about her daughter's mood and behavior.  Pt states "I am the same as I was 2 months ago. I don't want to change my meds".  Pt states no meds can help and is scared to try anything else. Pt states her mood will not improve until her stressors improve.   Depression is worse due to recent stressors. She is depressed every day and cries every day. Nothing has changed with her boyfriend.  Sleep is good. Pt denies SI/HI.  Denies manic and hypomanic symptoms including periods of decreased need for sleep, increased energy, mood lability, impulsivity, FOI, and excessive spending.  Anxiety is controlled with Ativan and Seroquel.   Pt states she was more balanced with Adderall, Ativan, Ambien and Wellbutrin with her previous psychiatrist.   Taking meds as prescribed and denies SE.   Visit Diagnosis:    ICD-10-CM   1. 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 GAD (generalized anxiety disorder) F41.1   3. Panic disorder without agoraphobia F41.0   4. Other insomnia G47.09      Past Psychiatric History:  Anxiety:Yes Bipolar Disorder:Yes Depression:Yes Mania:No Psychosis:No Schizophrenia:No Personality Disorder:No Hospitalization for psychiatric illness:Yes History of Electroconvulsive Shock Therapy:No Prior Suicide Attempts:No Previous meds- Prozac, Wellbutrin, Zoloft, Cymbalta, Lithium, Lexapro  Past Medical History:  Past Medical History:  Diagnosis Date  . Allergy   . Anemia   . Anxiety   . Barrett esophagus   . Bipolar disorder (HCC)   . Depression   . Esophageal reflux   . Fibromyalgia   . Hiatal hernia   . Hx of cardiovascular stress test    ETT-Myoview (11/2013):  Apical inferior reversibility (diaph atten vs very slight ischemia), normal wall motion, EF 57%; low risk  . Iron deficiency anemia, unspecified   . Irritable bowel syndrome   . Kidney infection   . Lupus   . MS (multiple sclerosis) (HCC)   . RA (rheumatoid arthritis) (HCC)   . Tachycardia   . 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 APPENDECTOMY    . CESAREAN SECTION    . MANDIBLE FRACTURE SURGERY    . NASAL SINUS SURGERY    . PARTIAL HYSTERECTOMY      Family Psychiatric History:  Family History  Problem Relation Age of Onset  . Diabetes Paternal Grandmother   . Diabetes Paternal Grandfather   . ADD / ADHD Son   . Depression Mother   . Suicidality Neg Hx   . Anxiety disorder Neg Hx   . Bipolar disorder Neg Hx     Social History:  Social History   Social History  . Marital status: Divorced    Spouse name: N/A  . Number of children: 2  . Years of education: N/A   Occupational History  . Student    Social History Main Topics  . Smoking status: Never Smoker  . Smokeless tobacco: Never Used  . Alcohol use No     Comment: occasional- afew times a year (2-3 glasses of wine)  . Drug use: No  . Sexual activity: Yes    Partners: Male  Birth control/ protection: Surgical   Other Topics Concern  . None   Social History Narrative  . None    Allergies: No Known Allergies  Metabolic Disorder Labs: No results found for: HGBA1C, MPG No results found for: PROLACTIN No results found for: CHOL, TRIG, HDL, CHOLHDL, VLDL, LDLCALC   Current Medications: Current Outpatient Prescriptions  Medication Sig Dispense Refill  . buPROPion (WELLBUTRIN XL) 150 MG 24 hr tablet Take 3 tablets (450 mg total) by mouth daily. 90 tablet 2  . cetirizine (ZYRTEC) 10 MG tablet Take 10 mg by mouth daily.    . fluticasone (FLONASE) 50 MCG/ACT nasal spray Place 2 sprays into both nostrils daily.    .  hydrocortisone valerate ointment (WESTCORT) 0.2 % Apply topically 2 (two) times daily. 45 g 0  . LORazepam (ATIVAN) 1 MG tablet Take 1 tablet (1 mg total) by mouth 3 (three) times daily as needed for anxiety. 90 tablet 2  . metoprolol succinate (TOPROL-XL) 25 MG 24 hr tablet Take 3 tablets (75 mg total) by mouth daily. 90 tablet 12  . omeprazole (PRILOSEC) 40 MG capsule Take 40 mg by mouth daily.    . ondansetron (ZOFRAN) 4 MG tablet Take 2 tablets (8 mg total) by mouth every 8 (eight) hours as needed for nausea. 30 tablet 0  . QUEtiapine (SEROQUEL) 200 MG tablet Take 1 tablet (200 mg total) by mouth at bedtime. 30 tablet 2  . QUEtiapine (SEROQUEL) 25 MG tablet Take 1 tablet (25 mg total) by mouth daily as needed (anxiety). 30 tablet 2  . topiramate (TOPAMAX) 25 MG tablet Take 25 mg by mouth 3 (three) times daily.     No current facility-administered medications for this visit.       Musculoskeletal: Strength & Muscle Tone: within normal limits Gait & Station: normal Patient leans: N/A  Psychiatric Specialty Exam: Review of Systems  Gastrointestinal: Positive for abdominal pain, constipation and diarrhea.  Psychiatric/Behavioral: Positive for depression. Negative for hallucinations, substance abuse and suicidal ideas. The patient is nervous/anxious. The patient does not have insomnia.     Blood pressure 109/75, pulse 73, height 5\' 6"  (1.676 m), weight 133 lb 9.6 oz (60.6 kg).Body mass index is 21.56 kg/m.  General Appearance: Casual  Eye Contact:  Good  Speech:  Clear and Coherent and Normal Rate  Volume:  Normal  Mood:  Anxious and Depressed  Affect:  Congruent  Thought Process:  Coherent and Descriptions of Associations: Circumstantial  Orientation:  Full (Time, Place, and Person)  Thought Content: Rumination   Suicidal Thoughts:  No  Homicidal Thoughts:  No  Memory:  Immediate;   Good Recent;   Good Remote;   Good  Judgement:  Fair  Insight:  Fair  Psychomotor Activity:   Normal  Concentration:  Concentration: Good and Attention Span: Good  Recall:  Good  Fund of Knowledge: Good  Language: Good  Akathisia:  No  Handed:  Right  AIMS (if indicated):  AIMS:  Facial and Oral Movements  Muscles of Facial Expression: None, normal  Lips and Perioral Area: None, normal  Jaw: None, normal  Tongue: None, normal Extremity Movements: Upper (arms, wrists, hands, fingers): None, normal  Lower (legs, knees, ankles, toes): None, normal,  Trunk Movements:  Neck, shoulders, hips: None, normal,  Overall Severity : Severity of abnormal movements (highest score from questions above): None, normal  Incapacitation due to abnormal movements: None, normal  Patient's awareness of abnormal movements (rate only patient's report): No Awareness, Dental Status  Current problems with teeth and/or dentures?: No  Does patient usually wear dentures?: No     Assets:  Communication Skills Desire for Improvement Housing  ADL's:  Intact  Cognition: WNL  Sleep:  poor     Treatment Plan Summary:Medication management  Assessment: MDD-recurrent, severe without psychotic features vs Bipolar II disorder; GAD; Panic disorder without agoraphobia; ADD by hx   Medication management with supportive therapy. Risks/benefits and SE of the medication discussed. Pt verbalized understanding and verbal consent obtained for treatment.  Affirm with the patient that the medications are taken as ordered. Patient expressed understanding of how their medications were to be used.    Meds: Wellbutrin XL  po qD for depression and concentration Ativan  po TID prn anxiety Seroquel  po qHS for depression, anxiety and sleep Seroquel  po qD prn anxiety Pt does not want meds changed today  Labs: none  Therapy: brief supportive therapy provided. Discussed psychosocial stressors in detail.     Consultations: psychological testing for ADD, request MR from previous psychiatrist  Pt  denies SI and is at an acute low risk for suicide. Patient told to call clinic if any problems occur. Patient advised to go to ER if they should develop SI/HI, side effects, or if symptoms worsen. Has crisis numbers to call if needed. Pt verbalized understanding.  F/up in 2 months or sooner if needed   Oletta Darter, MD 01/21/2017, 4:06 PM

## 2017-01-25 ENCOUNTER — Telehealth (HOSPITAL_COMMUNITY): Payer: Self-pay

## 2017-01-25 DIAGNOSIS — F332 Major depressive disorder, recurrent severe without psychotic features: Secondary | ICD-10-CM

## 2017-01-25 NOTE — Telephone Encounter (Signed)
Medication problem - Called [facility] and spoke with Morrie Sheldon, pharmacist that reported Medicaid would not auhthorize Bupropion 150mg , 3 a day so would have to provide 2 prescriptions, one for 150 mg, one a day and one for 300 mg, one a day.

## 2017-01-27 MED ORDER — BUPROPION HCL ER (XL) 300 MG PO TB24: 300.0000 mg | ORAL_TABLET | ORAL | 1 refills | Status: DC

## 2017-01-27 MED ORDER — BUPROPION HCL ER (XL) 150 MG PO TB24: 150.0000 mg | ORAL_TABLET | ORAL | 1 refills | Status: DC

## 2017-01-27 NOTE — Telephone Encounter (Signed)
Medication management -Called Dr. Michae Kava who approved changing patient's medication to Bupropion 300 mg, one a day plus a 150 mg, one a day as to what Medicaid would allow.  New orders sent as verbally authorized by Dr. Michae Kava for both dosages, #30 with 1 refill each as Medicaid will not approve 150 mg, 3 a day.  Both [facility] on [address] as approved.

## 2017-03-09 ENCOUNTER — Telehealth: Payer: Self-pay | Admitting: Cardiology

## 2017-03-09 ENCOUNTER — Other Ambulatory Visit: Payer: Self-pay | Admitting: *Deleted

## 2017-03-09 MED ORDER — METOPROLOL SUCCINATE ER 25 MG PO TB24: 75.0000 mg | ORAL_TABLET | Freq: Every day | ORAL | 0 refills | Status: DC

## 2017-03-09 NOTE — Telephone Encounter (Signed)
New Message   *STAT* If patient is at the pharmacy, call can be transferred to refill team.   1. Which medications need to be refilled? (please list name of each medication and dose if known) Metoprolol 25mg  3 tablets daily   2. Which pharmacy/location (including street and city if local pharmacy) is medication to be sent to? [facility] [address]  3. Do they need a 30 day or 90 day supply? 90

## 2017-04-01 ENCOUNTER — Ambulatory Visit (INDEPENDENT_AMBULATORY_CARE_PROVIDER_SITE_OTHER): Payer: Medicaid Other | Admitting: Psychiatry

## 2017-04-01 ENCOUNTER — Encounter (HOSPITAL_COMMUNITY): Payer: Self-pay | Admitting: Psychiatry

## 2017-04-01 DIAGNOSIS — F410 Panic disorder [episodic paroxysmal anxiety] without agoraphobia: Secondary | ICD-10-CM

## 2017-04-01 DIAGNOSIS — Z818 Family history of other mental and behavioral disorders: Secondary | ICD-10-CM

## 2017-04-01 DIAGNOSIS — F332 Major depressive disorder, recurrent severe without psychotic features: Secondary | ICD-10-CM

## 2017-04-01 DIAGNOSIS — G4709 Other insomnia: Secondary | ICD-10-CM

## 2017-04-01 DIAGNOSIS — Z91410 Personal history of adult physical and sexual abuse: Secondary | ICD-10-CM

## 2017-04-01 DIAGNOSIS — Z630 Problems in relationship with spouse or partner: Secondary | ICD-10-CM

## 2017-04-01 DIAGNOSIS — F411 Generalized anxiety disorder: Secondary | ICD-10-CM

## 2017-04-01 MED ORDER — QUETIAPINE FUMARATE 200 MG PO TABS: 200.0000 mg | ORAL_TABLET | Freq: Every day | ORAL | 1 refills | Status: DC

## 2017-04-01 MED ORDER — QUETIAPINE FUMARATE 25 MG PO TABS: 25.0000 mg | ORAL_TABLET | Freq: Every day | ORAL | 1 refills | Status: DC | PRN

## 2017-04-01 MED ORDER — BUPROPION HCL ER (XL) 150 MG PO TB24: 150.0000 mg | ORAL_TABLET | ORAL | 1 refills | Status: DC

## 2017-04-01 MED ORDER — BUPROPION HCL ER (XL) 300 MG PO TB24: 300.0000 mg | ORAL_TABLET | ORAL | 1 refills | Status: DC

## 2017-04-01 MED ORDER — LORAZEPAM 1 MG PO TABS: 1.0000 mg | ORAL_TABLET | Freq: Three times a day (TID) | ORAL | 1 refills | Status: DC | PRN
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
## 2017-05-28 NOTE — Progress Notes (Deleted)
HPI: FU palpitations. Echocardiogram in 09/2012 demonstrated normal LV functionand grade 1 diastolic dysfunction. Holter monitor 09/2012 demonstrated NSR and sinus tachycardia. CTA in June of 2015 showed no pulmonary embolus. Nuclear study repeated in July 2015. Ejection fraction 57%. There was felt to be diaphragmatic attenuation but slight ischemia could not be excluded; I reviewed and felt no ischemia. Since last seen,   Current Outpatient Medications  Medication Sig Dispense Refill  . buPROPion (WELLBUTRIN XL) 150 MG 24 hr tablet Take 1 tablet (150 mg total) by mouth every morning. 30 tablet 1  . buPROPion (WELLBUTRIN XL) 300 MG 24 hr tablet Take 1 tablet (300 mg total) by mouth every morning. 30 tablet 1  . cetirizine (ZYRTEC) 10 MG tablet Take 10 mg by mouth daily.    . fluticasone (FLONASE) 50 MCG/ACT nasal spray Place 2 sprays into both nostrils daily.    . hydrocortisone valerate ointment (WESTCORT) 0.2 % Apply topically 2 (two) times daily. 45 g 0  . LORazepam (ATIVAN) 1 MG tablet Take 1 tablet (1 mg total) by mouth 3 (three) times daily as needed for anxiety. 90 tablet 1  . metoprolol succinate (TOPROL-XL) 25 MG 24 hr tablet Take 3 tablets (75 mg total) daily by mouth. 270 tablet 0  . omeprazole (PRILOSEC) 40 MG capsule Take 40 mg by mouth daily.    . ondansetron (ZOFRAN) 4 MG tablet Take 2 tablets (8 mg total) by mouth every 8 (eight) hours as needed for nausea. 30 tablet 0  . QUEtiapine (SEROQUEL) 200 MG tablet Take 1 tablet (200 mg total) by mouth at bedtime. 30 tablet 1  . QUEtiapine (SEROQUEL) 25 MG tablet Take 1 tablet (25 mg total) by mouth daily as needed (anxiety). 30 tablet 1  . topiramate (TOPAMAX) 25 MG tablet Take 25 mg by mouth 3 (three) times daily.     No current facility-administered medications for this visit.      Past Medical History:  Diagnosis Date  . Allergy   . Anemia   . Anxiety   . Barrett esophagus   . Bipolar disorder (HCC)   . Depression     . Esophageal reflux   . Fibromyalgia   . Hiatal hernia   . Hx of cardiovascular stress test    ETT-Myoview (11/2013):  Apical inferior reversibility (diaph atten vs very slight ischemia), normal wall motion, EF 57%; low risk  . Iron deficiency anemia, unspecified   . Irritable bowel syndrome   . Kidney infection   . Lupus   . MS (multiple sclerosis) (HCC)   . RA (rheumatoid arthritis) (HCC)   . Tachycardia   . 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 APPENDECTOMY    . CESAREAN SECTION    . MANDIBLE FRACTURE SURGERY    . NASAL SINUS SURGERY    . PARTIAL HYSTERECTOMY      Social History   Socioeconomic History  . Marital status: Divorced    Spouse name: Not on file  . Number of children: 2  . Years of education: Not on file  . Highest education level: Not on file  Social Needs  . Financial resource strain: Not on file  . Food insecurity - worry: Not on file  . Food insecurity - inability: Not on file  . Transportation needs - medical: Not on file  . Transportation needs - non-medical: Not on file  Occupational History  . Occupation: Consulting civil engineer  Tobacco Use  . Smoking status: Never Smoker  . Smokeless tobacco: Never Used  Substance and Sexual Activity  . Alcohol use: No    Comment: occasional- afew times a year (2-3 glasses of wine)  . Drug use: No  . Sexual activity: Yes    Partners: Male    Birth control/protection: Surgical  Other Topics Concern  . Not on file  Social History Narrative  . Not on file    Family History  Problem Relation Age of Onset  . Diabetes Paternal Grandmother   . Diabetes Paternal Grandfather   . ADD / ADHD Son   . Depression Mother   . Suicidality Neg Hx   . Anxiety disorder Neg Hx   . Bipolar disorder Neg Hx     ROS: no fevers or chills, productive cough, hemoptysis, dysphasia, odynophagia, melena, hematochezia, dysuria, hematuria, rash, seizure activity, orthopnea, PND, pedal  edema, claudication. Remaining systems are negative.  Physical Exam: Well-developed well-nourished in no acute distress.  Skin is warm and dry.  HEENT is normal.  Neck is supple.  Chest is clear to auscultation with normal expansion.  Cardiovascular exam is regular rate and rhythm.  Abdominal exam nontender or distended. No masses palpated. Extremities show no edema. neuro grossly intact  ECG- personally reviewed  A/P  1  Olga Millers, MD

## 2017-06-03 ENCOUNTER — Ambulatory Visit (INDEPENDENT_AMBULATORY_CARE_PROVIDER_SITE_OTHER): Payer: Medicaid Other | Admitting: Psychiatry

## 2017-06-03 ENCOUNTER — Encounter (HOSPITAL_COMMUNITY): Payer: Self-pay | Admitting: Psychiatry

## 2017-06-03 VITALS — BP 98/64 | HR 69 | Ht 66.0 in | Wt 125.2 lb

## 2017-06-03 DIAGNOSIS — Z818 Family history of other mental and behavioral disorders: Secondary | ICD-10-CM | POA: Diagnosis not present

## 2017-06-03 DIAGNOSIS — Z634 Disappearance and death of family member: Secondary | ICD-10-CM | POA: Diagnosis not present

## 2017-06-03 DIAGNOSIS — G4700 Insomnia, unspecified: Secondary | ICD-10-CM

## 2017-06-03 DIAGNOSIS — F332 Major depressive disorder, recurrent severe without psychotic features: Secondary | ICD-10-CM | POA: Diagnosis not present

## 2017-06-03 DIAGNOSIS — G4709 Other insomnia: Secondary | ICD-10-CM

## 2017-06-03 DIAGNOSIS — T7491XS Unspecified adult maltreatment, confirmed, sequela: Secondary | ICD-10-CM

## 2017-06-03 DIAGNOSIS — F411 Generalized anxiety disorder: Secondary | ICD-10-CM

## 2017-06-03 DIAGNOSIS — F410 Panic disorder [episodic paroxysmal anxiety] without agoraphobia: Secondary | ICD-10-CM

## 2017-06-03 DIAGNOSIS — R450 Nervousness: Secondary | ICD-10-CM

## 2017-06-03 DIAGNOSIS — Z91410 Personal history of adult physical and sexual abuse: Secondary | ICD-10-CM | POA: Diagnosis not present

## 2017-06-03 MED ORDER — QUETIAPINE FUMARATE 200 MG PO TABS: 200.0000 mg | ORAL_TABLET | Freq: Every day | ORAL | 2 refills | Status: DC

## 2017-06-03 MED ORDER — BUPROPION HCL ER (XL) 150 MG PO TB24: 150.0000 mg | ORAL_TABLET | ORAL | 2 refills | Status: DC

## 2017-06-03 MED ORDER — LORAZEPAM 1 MG PO TABS: 1.0000 mg | ORAL_TABLET | Freq: Three times a day (TID) | ORAL | 2 refills | Status: DC | PRN

## 2017-06-03 MED ORDER — BUPROPION HCL ER (XL) 300 MG PO TB24: 300.0000 mg | ORAL_TABLET | ORAL | 2 refills | Status: DC

## 2017-06-03 MED ORDER — QUETIAPINE FUMARATE 25 MG PO TABS: 25.0000 mg | ORAL_TABLET | Freq: Every day | ORAL | 2 refills | Status: DC | PRN
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
## 2017-06-07 ENCOUNTER — Ambulatory Visit: Payer: Medicaid Other | Admitting: Cardiology

## 2017-06-14 ENCOUNTER — Ambulatory Visit (HOSPITAL_COMMUNITY): Payer: Self-pay | Admitting: Licensed Clinical Social Worker

## 2017-06-16 ENCOUNTER — Ambulatory Visit (HOSPITAL_COMMUNITY): Payer: Self-pay | Admitting: Licensed Clinical Social Worker

## 2017-06-18 ENCOUNTER — Ambulatory Visit: Payer: Medicaid Other | Admitting: Physician Assistant

## 2017-06-21 ENCOUNTER — Encounter: Payer: Self-pay | Admitting: *Deleted

## 2017-06-23 ENCOUNTER — Other Ambulatory Visit: Payer: Self-pay | Admitting: Cardiology

## 2017-08-19 ENCOUNTER — Ambulatory Visit (INDEPENDENT_AMBULATORY_CARE_PROVIDER_SITE_OTHER): Payer: Medicaid Other | Admitting: Psychiatry

## 2017-08-19 DIAGNOSIS — F410 Panic disorder [episodic paroxysmal anxiety] without agoraphobia: Secondary | ICD-10-CM

## 2017-08-19 DIAGNOSIS — Z6379 Other stressful life events affecting family and household: Secondary | ICD-10-CM

## 2017-08-19 DIAGNOSIS — G4709 Other insomnia: Secondary | ICD-10-CM

## 2017-08-19 DIAGNOSIS — F411 Generalized anxiety disorder: Secondary | ICD-10-CM

## 2017-08-19 DIAGNOSIS — F332 Major depressive disorder, recurrent severe without psychotic features: Secondary | ICD-10-CM | POA: Diagnosis not present

## 2017-08-19 DIAGNOSIS — Z62820 Parent-biological child conflict: Secondary | ICD-10-CM

## 2017-08-19 DIAGNOSIS — Z818 Family history of other mental and behavioral disorders: Secondary | ICD-10-CM | POA: Diagnosis not present

## 2017-08-19 DIAGNOSIS — K219 Gastro-esophageal reflux disease without esophagitis: Secondary | ICD-10-CM | POA: Diagnosis not present

## 2017-08-19 MED ORDER — QUETIAPINE FUMARATE 25 MG PO TABS
25.0000 mg | ORAL_TABLET | Freq: Every day | ORAL | 0 refills | Status: AC | PRN
Start: 2017-08-19 — End: 2018-08-19

## 2017-08-19 MED ORDER — BUPROPION HCL ER (XL) 150 MG PO TB24: 150.0000 mg | ORAL_TABLET | ORAL | 0 refills | Status: AC

## 2017-08-19 MED ORDER — QUETIAPINE FUMARATE 200 MG PO TABS: 200.0000 mg | ORAL_TABLET | Freq: Every day | ORAL | 0 refills | Status: AC

## 2017-08-19 MED ORDER — BUPROPION HCL ER (XL) 300 MG PO TB24: 300.0000 mg | ORAL_TABLET | ORAL | 0 refills | Status: AC

## 2017-08-19 MED ORDER — LORAZEPAM 1 MG PO TABS: 1.0000 mg | ORAL_TABLET | Freq: Three times a day (TID) | ORAL | 0 refills | Status: DC | PRN
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
## 2017-08-26 ENCOUNTER — Ambulatory Visit (HOSPITAL_COMMUNITY): Payer: Self-pay | Admitting: Psychiatry

## 2017-09-13 ENCOUNTER — Other Ambulatory Visit: Payer: Self-pay | Admitting: Cardiology

## 2017-09-13 NOTE — Telephone Encounter (Signed)
Rx request sent to pharmacy.  

## 2017-11-08 ENCOUNTER — Other Ambulatory Visit: Payer: Self-pay

## 2017-11-08 ENCOUNTER — Encounter (HOSPITAL_BASED_OUTPATIENT_CLINIC_OR_DEPARTMENT_OTHER): Payer: Self-pay | Admitting: Emergency Medicine

## 2017-11-08 ENCOUNTER — Emergency Department (HOSPITAL_BASED_OUTPATIENT_CLINIC_OR_DEPARTMENT_OTHER)
Admission: EM | Admit: 2017-11-08 | Discharge: 2017-11-08 | Disposition: A | Discharge status: Home / Self Care | Payer: Self-pay | Attending: Emergency Medicine | Admitting: Emergency Medicine

## 2017-11-08 DIAGNOSIS — Z760 Encounter for issue of repeat prescription: Secondary | ICD-10-CM

## 2017-11-08 DIAGNOSIS — Z79899 Other long term (current) drug therapy: Secondary | ICD-10-CM | POA: Insufficient documentation

## 2017-11-08 DIAGNOSIS — G35 Multiple sclerosis: Secondary | ICD-10-CM | POA: Insufficient documentation

## 2017-11-08 DIAGNOSIS — F419 Anxiety disorder, unspecified: Secondary | ICD-10-CM | POA: Insufficient documentation

## 2017-11-08 MED ORDER — LORAZEPAM 1 MG PO TABS
1.0000 mg | ORAL_TABLET | Freq: Once | ORAL | Status: AC
  Administered 2017-11-08: 1 mg via ORAL
  Filled 2017-11-08: qty 1

## 2017-11-08 MED ORDER — LORAZEPAM 1 MG PO TABS: ORAL_TABLET | ORAL | 0 refills | Status: AC

## 2017-11-08 MED ORDER — HYDROXYZINE HCL 25 MG PO TABS: 25.0000 mg | ORAL_TABLET | Freq: Four times a day (QID) | ORAL | 0 refills | Status: AC | PRN

## 2017-11-08 MED FILL — hydrOXYzine HCL 25 MG TABS: 25 | 7 days supply | Qty: 30 | Fill #0

## 2017-11-08 MED FILL — LORazepam 1 MG TABS: 1 | 4 days supply | Qty: 4 | Fill #0

## 2017-11-08 NOTE — ED Triage Notes (Signed)
Pt sts she is having a panic attack because she has one Ativan left and she is in between psychiatrists

## 2017-11-08 NOTE — ED Provider Notes (Signed)
[facility] EMERGENCY DEPARTMENT Provider Note   CSN: [id number] Arrival date & time: 11/08/17  4098     History   Chief Complaint Chief Complaint  Patient presents with  . Anxiety    HPI Brandi Santiago is a 47 y.o. female with past medical history of bipolar 1, MS, lupus, anxiety, who presents today for evaluation of anxiety.  She is on long-term Ativan use and takes 1 mg 3 times a day.  Chart review shows that in the beginning of April her psychiatrist felt like she was only visiting for the Ativan prescriptions as she reportedly refused other treatments and discharged her from their practice.  She reports that she has 1 pill of Ativan left and is requesting a 30-day refill.  She reports that she knows she has procrastinated getting a new psychiatrist.  She reports that she has had itchy skin recently and feeling very anxious.  She reports feeling shaky, knowing that she has only 1 pill of Ativan left.  She denies any other symptoms or concerns today.  HPI  Past Medical History:  Diagnosis Date  . Allergy   . Anemia   . Anxiety   . Barrett esophagus   . Bipolar disorder (HCC)   . Depression   . Esophageal reflux   . Fibromyalgia   . Hiatal hernia   . Hx of cardiovascular stress test    ETT-Myoview (11/2013):  Apical inferior reversibility (diaph atten vs very slight ischemia), normal wall motion, EF 57%; low risk  . Iron deficiency anemia, unspecified   . Irritable bowel syndrome   . Kidney infection   . Lupus (HCC)   . MS (multiple sclerosis) (HCC)   . RA (rheumatoid arthritis) (HCC)   . Tachycardia   . 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 Major depressive disorder, recurrent, severe without psychotic features (HCC) 02/06/2014  . GAD (generalized anxiety disorder) 02/06/2014  . Panic disorder without agoraphobia 02/06/2014  . Syncope 11/28/2013  . Malar rash 02/12/2013  . Laceration of scalp  02/12/2013  . Fall 02/12/2013  . Chest pain 09/08/2012  . Tachycardia 09/08/2012  . Pyelonephritis 10/28/2011  . Anemia 10/28/2011  . IBS (irritable bowel syndrome) 10/28/2011  . Abnormal LFTs 10/28/2011  . Abdominal pain 10/28/2011  . Obsessive compulsive disorder 10/28/2011    Class: Chronic  . ADD (attention deficit disorder with hyperactivity) 07/08/2011  . Hx of migraine headaches 07/08/2011  . Acne cystica 07/08/2011  . Fe deficiency anemia 07/08/2011  . ANXIETY, CHRONIC 04/22/2007  . DEPRESSION 04/22/2007  . GASTROESOPHAGEAL REFLUX DISEASE, CHRONIC 04/22/2007  . NAUSEA, CHRONIC 04/22/2007    Past Surgical History:  Procedure Laterality Date  . APPENDECTOMY    . CESAREAN SECTION    . MANDIBLE FRACTURE SURGERY    . NASAL SINUS SURGERY    . PARTIAL HYSTERECTOMY       OB History   None      Home Medications    Prior to Admission medications   Medication Sig Start Date End Date Taking? Authorizing Provider  buPROPion (WELLBUTRIN XL) 150 MG 24 hr tablet Take 1 tablet (150 mg total) by mouth every morning. 08/19/17 08/19/18  Oletta Darter, MD  buPROPion (WELLBUTRIN XL) 300 MG 24 hr tablet Take 1 tablet (300 mg total) by mouth every morning. 08/19/17 08/19/18  Oletta Darter, MD  cetirizine (ZYRTEC) 10 MG tablet Take 10 mg by mouth daily.  [provider]  esomeprazole (NEXIUM) 40 MG capsule Take by mouth daily. 09/21/17   [provider]  fluticasone (FLONASE) 50 MCG/ACT nasal spray Place 2 sprays into both nostrils daily.    [provider]  hydrocortisone valerate ointment (WESTCORT) 0.2 % Apply topically 2 (two) times daily. 02/12/13   Purvis Sheffield, MD  hydrOXYzine (ATARAX/VISTARIL) 25 MG tablet Take 1 tablet (25 mg total) by mouth every 6 (six) hours as needed for up to 10 days for anxiety or itching. 11/08/17 11/18/17  Cristina Gong, PA-C  LORazepam (ATIVAN) 1 MG tablet Take 1 mg twice a day for one day, then take one mg once a  day for one day then take 0.5mg  once a day for two days 11/08/17   Cristina Gong, PA-C  metoprolol succinate (TOPROL-XL) 25 MG 24 hr tablet Take 3 tablets (75 mg total) by mouth daily. Please schedule appointment for refills. 09/13/17   Lewayne Bunting, MD  omeprazole (PRILOSEC) 40 MG capsule Take 40 mg by mouth daily.    [provider]  ondansetron (ZOFRAN) 4 MG tablet Take 2 tablets (8 mg total) by mouth every 8 (eight) hours as needed for nausea. 11/11/12   Mardella Layman, MD  QUEtiapine (SEROQUEL) 200 MG tablet Take 1 tablet (200 mg total) by mouth at bedtime. 08/19/17 08/19/18  Oletta Darter, MD  QUEtiapine (SEROQUEL) 25 MG tablet Take 1 tablet (25 mg total) by mouth daily as needed (anxiety). 08/19/17 08/19/18  Oletta Darter, MD  topiramate (TOPAMAX) 25 MG tablet Take 25 mg by mouth 3 (three) times daily.    [provider]    Family History Family History  Problem Relation Age of Onset  . Diabetes Paternal Grandmother   . Diabetes Paternal Grandfather   . ADD / ADHD Son   . Depression Mother   . Suicidality Neg Hx   . Anxiety disorder Neg Hx   . Bipolar disorder Neg Hx     Social History Social History   Tobacco Use  . Smoking status: Never Smoker  . Smokeless tobacco: Never Used  Substance Use Topics  . Alcohol use: No    Comment: occasional- afew times a year (2-3 glasses of wine)  . Drug use: No     Allergies   Patient has no known allergies.   Review of Systems Review of Systems  Constitutional: Negative for fever.       Itchy bumps.  Psychiatric/Behavioral: Negative for self-injury and suicidal ideas. The patient is nervous/anxious.   All other systems reviewed and are negative.    Physical Exam Updated Vital Signs BP 120/87   Pulse (!) 106   Temp 97.6 F (36.4 C) (Oral)   Resp 20   Ht 5' 6.5" (1.689 m)   Wt 52.2 kg (115 lb)   SpO2 100%   BMI 18.28 kg/m   Physical Exam  Constitutional: She appears well-developed. No  distress.  HENT:  Head: Normocephalic.  Cardiovascular: Regular rhythm.  Neurological: She is alert.  Skin: Skin is warm and dry. She is not diaphoretic.  There are multiple small scabs on patient's face, right arm.  These have no obvious signs of secondary infection, however patient has not covered up with make-up so difficult to evaluate and patient does not wish for further evaluation of these today.  No obvious lesions in between fingers.  Appear consistent with skin picking.  Psychiatric:  She reports feeling anxious and jittery.  Denies SI or HI.  Her  behavior is normal, speech is normal.  No obvious shaking.  Nursing note and vitals reviewed.    ED Treatments / Results  Labs (all labs ordered are listed, but only abnormal results are displayed) Labs Reviewed - No data to display  EKG None  Radiology No results found.  Procedures Procedures (including critical care time)  Medications Ordered in ED Medications  LORazepam (ATIVAN) tablet 1 mg (1 mg Oral Given 11/08/17 1038)     Initial Impression / Assessment and Plan / ED Course  I have reviewed the triage vital signs and the nursing notes.  Pertinent labs & imaging results that were available during my care of the patient were reviewed by me and considered in my medical decision making (see chart for details).    Patient presents today for evaluation of anxiety and med refill.  Chart review shows that she chronically gets 1 mg of Ativan 3 times a day and has for multiple months, however in the beginning of April she was cut off by her psychiatrist.  PMP aware shows that she her Ativan prescription about 1 month ago.  PMP appears grossly consistent with reported use in chart.  Discussed with patient that I am unable to give her a 30-day supply.  Patient stated that she has been procrastinating getting a new psychiatrist, no she needs to.  She is given outpatient resource guide.  She will be given a short taper to decrease  risk of seizures, however this was discussed with the patient, along with the need to establish care with a psychiatrist.  Return precautions were discussed for both psychiatric, general, and benzodiazepine withdrawal, and she states her understanding.  She understands that she needs to use caution while she coming off benzos.  Patient discharged home after discussion with Dr. Rubin Payor.  Given atarax for itching and anxiety.   Final Clinical Impressions(s) / ED Diagnoses   Final diagnoses:  Anxiety  Encounter for medication refill  Long term prescription benzodiazepine use    ED Discharge Orders        Ordered    hydrOXYzine (ATARAX/VISTARIL) 25 MG tablet  Every 6 hours PRN     11/08/17 1033    LORazepam (ATIVAN) 1 MG tablet     11/08/17 1033       Cristina Gong, [place] 11/08/17 1042    Benjiman Core, MD 11/08/17 1557

## 2017-11-08 NOTE — Discharge Instructions (Addendum)
Please take two pills of ativan today, after that take one pill once a day until you have one pill left.  Then split the pill in half and take half a pill for two days.  If you have severe symptoms please seek additional medical care.   Today you received medications that may make you sleepy or impair your ability to make decisions.  For the next 24 hours please do not drive, operate heavy machinery, care for a small child with out another adult present, or perform any activities that may cause harm to you or someone else if you were to fall asleep or be impaired.   You are being prescribed a medication which may make you sleepy. Please follow up of listed precautions for at least 24 hours after taking one dose.

## 2017-11-10 ENCOUNTER — Telehealth (HOSPITAL_COMMUNITY): Payer: Self-pay

## 2017-11-10 NOTE — Telephone Encounter (Signed)
Patient is calling to see if you would be willing to give her one more 30 day supply of Ativan, she states she only has 2 days left and that she has been trying to get insurance to see another doctor. Patient went to ED on 7/8, please see note. Please review and advise, thank you

## 2017-11-11 NOTE — Telephone Encounter (Signed)
Per ED note they reviewed how to taper. I will not refill benzos

## 2017-11-12 NOTE — Telephone Encounter (Signed)
Called patient and let her know what Dr. Michae Kava said
